# Patient Record
Sex: Male | Born: 1940 | Race: Black or African American | Hispanic: No | Marital: Married | Smoking: Former smoker
Health system: Southern US, Community
[De-identification: ages and names within clinical notes are randomized; demographics above are authoritative.]

## PROBLEM LIST (undated history)

## (undated) DIAGNOSIS — Z923 Personal history of irradiation: Secondary | ICD-10-CM

## (undated) DIAGNOSIS — K250 Acute gastric ulcer with hemorrhage: Secondary | ICD-10-CM

## (undated) DIAGNOSIS — IMO0001 Reserved for inherently not codable concepts without codable children: Secondary | ICD-10-CM

## (undated) DIAGNOSIS — Z8489 Family history of other specified conditions: Secondary | ICD-10-CM

## (undated) DIAGNOSIS — C61 Malignant neoplasm of prostate: Secondary | ICD-10-CM

## (undated) DIAGNOSIS — Z531 Procedure and treatment not carried out because of patient's decision for reasons of belief and group pressure: Secondary | ICD-10-CM

## (undated) DIAGNOSIS — I10 Essential (primary) hypertension: Secondary | ICD-10-CM

## (undated) DIAGNOSIS — E1121 Type 2 diabetes mellitus with diabetic nephropathy: Secondary | ICD-10-CM

## (undated) DIAGNOSIS — M1990 Unspecified osteoarthritis, unspecified site: Secondary | ICD-10-CM

## (undated) DIAGNOSIS — D649 Anemia, unspecified: Secondary | ICD-10-CM

## (undated) HISTORY — DX: Unspecified osteoarthritis, unspecified site: M19.90

## (undated) HISTORY — DX: Type 2 diabetes mellitus with diabetic nephropathy: E11.21

## (undated) HISTORY — DX: Malignant neoplasm of prostate: C61

## (undated) HISTORY — DX: Procedure and treatment not carried out because of patient's decision for reasons of belief and group pressure: Z53.1

## (undated) HISTORY — DX: Essential (primary) hypertension: I10

## (undated) HISTORY — DX: Personal history of irradiation: Z92.3

## (undated) HISTORY — DX: Reserved for inherently not codable concepts without codable children: IMO0001

## (undated) HISTORY — DX: Anemia, unspecified: D64.9

---

## 2004-10-19 ENCOUNTER — Emergency Department (HOSPITAL_COMMUNITY): Admission: EM | Admit: 2004-10-19 | Discharge: 2004-10-19 | Payer: Self-pay | Admitting: Emergency Medicine

## 2005-03-11 ENCOUNTER — Emergency Department (HOSPITAL_COMMUNITY): Admission: EM | Admit: 2005-03-11 | Discharge: 2005-03-12 | Payer: Self-pay | Admitting: *Deleted

## 2005-05-10 ENCOUNTER — Ambulatory Visit: Payer: Self-pay | Admitting: Gastroenterology

## 2005-06-06 ENCOUNTER — Ambulatory Visit: Payer: Self-pay | Admitting: Gastroenterology

## 2008-01-27 ENCOUNTER — Emergency Department (HOSPITAL_COMMUNITY): Admission: EM | Admit: 2008-01-27 | Discharge: 2008-01-27 | Payer: Self-pay | Admitting: Emergency Medicine

## 2010-07-14 DIAGNOSIS — C61 Malignant neoplasm of prostate: Secondary | ICD-10-CM

## 2010-07-14 HISTORY — DX: Malignant neoplasm of prostate: C61

## 2010-07-24 ENCOUNTER — Other Ambulatory Visit (HOSPITAL_COMMUNITY): Payer: Self-pay | Admitting: Urology

## 2010-07-24 DIAGNOSIS — C61 Malignant neoplasm of prostate: Secondary | ICD-10-CM

## 2010-08-01 ENCOUNTER — Encounter (HOSPITAL_COMMUNITY)
Admission: RE | Admit: 2010-08-01 | Discharge: 2010-08-01 | Disposition: A | Discharge status: Home / Self Care | Payer: Medicare Other | Source: Ambulatory Visit | Attending: Urology | Admitting: Urology

## 2010-08-01 ENCOUNTER — Encounter (HOSPITAL_COMMUNITY): Payer: Self-pay

## 2010-08-01 DIAGNOSIS — C61 Malignant neoplasm of prostate: Secondary | ICD-10-CM | POA: Insufficient documentation

## 2010-08-01 MED ORDER — TECHNETIUM TC 99M MEDRONATE IV KIT
23.7000 | PACK | Freq: Once | INTRAVENOUS | Status: AC | PRN
  Administered 2010-08-01: 23.7 via INTRAVENOUS

## 2010-08-07 ENCOUNTER — Other Ambulatory Visit (HOSPITAL_COMMUNITY): Payer: Self-pay | Admitting: Urology

## 2010-08-07 DIAGNOSIS — C61 Malignant neoplasm of prostate: Secondary | ICD-10-CM

## 2010-08-09 ENCOUNTER — Ambulatory Visit (HOSPITAL_COMMUNITY)
Admission: RE | Admit: 2010-08-09 | Discharge: 2010-08-09 | Disposition: A | Discharge status: Home / Self Care | Payer: Medicare Other | Source: Ambulatory Visit | Attending: Urology | Admitting: Urology

## 2010-08-09 DIAGNOSIS — C61 Malignant neoplasm of prostate: Secondary | ICD-10-CM

## 2010-08-09 DIAGNOSIS — M129 Arthropathy, unspecified: Secondary | ICD-10-CM | POA: Insufficient documentation

## 2010-08-09 DIAGNOSIS — R948 Abnormal results of function studies of other organs and systems: Secondary | ICD-10-CM | POA: Insufficient documentation

## 2010-08-09 DIAGNOSIS — Z8546 Personal history of malignant neoplasm of prostate: Secondary | ICD-10-CM | POA: Insufficient documentation

## 2010-09-14 ENCOUNTER — Emergency Department (HOSPITAL_COMMUNITY): Payer: Medicare Other

## 2010-09-14 ENCOUNTER — Inpatient Hospital Stay (HOSPITAL_COMMUNITY)
Admission: EM | Admit: 2010-09-14 | Discharge: 2010-09-21 | DRG: 554 | Disposition: A | Discharge status: Skilled Nursing Facility | Payer: Medicare Other | Attending: Family Medicine | Admitting: Family Medicine

## 2010-09-14 DIAGNOSIS — R5381 Other malaise: Secondary | ICD-10-CM | POA: Diagnosis present

## 2010-09-14 DIAGNOSIS — E119 Type 2 diabetes mellitus without complications: Secondary | ICD-10-CM | POA: Diagnosis present

## 2010-09-14 DIAGNOSIS — R7402 Elevation of levels of lactic acid dehydrogenase (LDH): Secondary | ICD-10-CM | POA: Diagnosis present

## 2010-09-14 DIAGNOSIS — Z8546 Personal history of malignant neoplasm of prostate: Secondary | ICD-10-CM

## 2010-09-14 DIAGNOSIS — IMO0001 Reserved for inherently not codable concepts without codable children: Secondary | ICD-10-CM | POA: Diagnosis present

## 2010-09-14 DIAGNOSIS — D509 Iron deficiency anemia, unspecified: Secondary | ICD-10-CM | POA: Diagnosis present

## 2010-09-14 DIAGNOSIS — E871 Hypo-osmolality and hyponatremia: Secondary | ICD-10-CM | POA: Diagnosis present

## 2010-09-14 DIAGNOSIS — M109 Gout, unspecified: Principal | ICD-10-CM | POA: Diagnosis present

## 2010-09-14 DIAGNOSIS — R7401 Elevation of levels of liver transaminase levels: Secondary | ICD-10-CM | POA: Diagnosis present

## 2010-09-14 DIAGNOSIS — N179 Acute kidney failure, unspecified: Secondary | ICD-10-CM | POA: Diagnosis present

## 2010-09-14 DIAGNOSIS — I10 Essential (primary) hypertension: Secondary | ICD-10-CM | POA: Diagnosis present

## 2010-09-14 LAB — COMPREHENSIVE METABOLIC PANEL
ALT: 119 U/L — ABNORMAL HIGH (ref 0–53)
Albumin: 2.6 g/dL — ABNORMAL LOW (ref 3.5–5.2)
Alkaline Phosphatase: 214 U/L — ABNORMAL HIGH (ref 39–117)
BUN: 31 mg/dL — ABNORMAL HIGH (ref 6–23)
Calcium: 9.7 mg/dL (ref 8.4–10.5)
GFR calc Af Amer: 41 mL/min — ABNORMAL LOW (ref >60)
GFR calc non Af Amer: 34 mL/min — ABNORMAL LOW (ref >60)
Glucose, Bld: 212 mg/dL — ABNORMAL HIGH (ref 70–99)
Potassium: 4.6 mEq/L (ref 3.5–5.1)
Sodium: 124 mEq/L — ABNORMAL LOW (ref 135–145)
Total Protein: 7.6 g/dL (ref 6.0–8.3)

## 2010-09-14 LAB — SYNOVIAL CELL COUNT + DIFF, W/ CRYSTALS
Eosinophils-Synovial: 0 % (ref 0–1)
Lymphocytes-Synovial Fld: 3 % (ref 0–20)
Monocyte-Macrophage-Synovial Fluid: 10 % — ABNORMAL LOW (ref 50–90)
WBC, Synovial: UNDETERMINED /mm3 (ref 0–200)

## 2010-09-14 LAB — DIFFERENTIAL
Basophils Absolute: 0 10*3/uL (ref 0.0–0.1)
Basophils Relative: 0 % (ref 0–1)
Eosinophils Absolute: 0 10*3/uL (ref 0.0–0.7)
Eosinophils Relative: 0 % (ref 0–5)
Lymphs Abs: 1 10*3/uL (ref 0.7–4.0)
Neutro Abs: 14.1 10*3/uL — ABNORMAL HIGH (ref 1.7–7.7)
Neutrophils Relative %: 83 % — ABNORMAL HIGH (ref 43–77)

## 2010-09-14 LAB — CK: Total CK: 26 U/L (ref 7–232)

## 2010-09-14 LAB — GLUCOSE, CAPILLARY: Glucose-Capillary: 250 mg/dL — ABNORMAL HIGH (ref 70–99)

## 2010-09-14 LAB — CBC
HCT: 27.2 % — ABNORMAL LOW (ref 39.0–52.0)
MCH: 24.1 pg — ABNORMAL LOW (ref 26.0–34.0)
MCHC: 33.5 g/dL (ref 30.0–36.0)
MCV: 72.1 fL — ABNORMAL LOW (ref 78.0–100.0)
Platelets: 422 10*3/uL — ABNORMAL HIGH (ref 150–400)
RBC: 3.77 MIL/uL — ABNORMAL LOW (ref 4.22–5.81)
RDW: 19 % — ABNORMAL HIGH (ref 11.5–15.5)
WBC: 17 10*3/uL — ABNORMAL HIGH (ref 4.0–10.5)

## 2010-09-14 LAB — URINALYSIS, ROUTINE W REFLEX MICROSCOPIC
Bilirubin Urine: NEGATIVE
Glucose, UA: NEGATIVE mg/dL
Hgb urine dipstick: NEGATIVE
Ketones, ur: NEGATIVE mg/dL
Leukocytes, UA: NEGATIVE
Nitrite: NEGATIVE
Specific Gravity, Urine: 1.017 (ref 1.005–1.030)
pH: 5.5 (ref 5.0–8.0)

## 2010-09-14 LAB — URIC ACID: Uric Acid, Serum: 10.3 mg/dL — ABNORMAL HIGH (ref 4.0–7.8)

## 2010-09-14 LAB — URINE MICROSCOPIC-ADD ON

## 2010-09-14 LAB — PROCALCITONIN: Procalcitonin: 3.53 ng/mL

## 2010-09-14 LAB — LACTIC ACID, PLASMA: Lactic Acid, Venous: 2.3 mmol/L — ABNORMAL HIGH (ref 0.5–2.2)

## 2010-09-15 ENCOUNTER — Inpatient Hospital Stay (HOSPITAL_COMMUNITY): Payer: Medicare Other

## 2010-09-15 LAB — COMPREHENSIVE METABOLIC PANEL
Albumin: 2.1 g/dL — ABNORMAL LOW (ref 3.5–5.2)
Alkaline Phosphatase: 196 U/L — ABNORMAL HIGH (ref 39–117)
BUN: 32 mg/dL — ABNORMAL HIGH (ref 6–23)
CO2: 24 mEq/L (ref 19–32)
Chloride: 94 mEq/L — ABNORMAL LOW (ref 96–112)
Glucose, Bld: 176 mg/dL — ABNORMAL HIGH (ref 70–99)
Potassium: 4.2 mEq/L (ref 3.5–5.1)
Total Bilirubin: 0.7 mg/dL (ref 0.3–1.2)

## 2010-09-15 LAB — HEMOGLOBIN A1C
Hgb A1c MFr Bld: 8.5 % — ABNORMAL HIGH (ref <5.7)
Mean Plasma Glucose: 197 mg/dL — ABNORMAL HIGH (ref <117)

## 2010-09-15 LAB — HIV ANTIBODY (ROUTINE TESTING W REFLEX): HIV: NONREACTIVE

## 2010-09-15 LAB — CBC
HCT: 21.8 % — ABNORMAL LOW (ref 39.0–52.0)
Hemoglobin: 7.4 g/dL — ABNORMAL LOW (ref 13.0–17.0)
MCH: 24.5 pg — ABNORMAL LOW (ref 26.0–34.0)
MCHC: 33.9 g/dL (ref 30.0–36.0)
MCV: 72.2 fL — ABNORMAL LOW (ref 78.0–100.0)
Platelets: 336 10*3/uL (ref 150–400)
RDW: 19 % — ABNORMAL HIGH (ref 11.5–15.5)
WBC: 11 10*3/uL — ABNORMAL HIGH (ref 4.0–10.5)

## 2010-09-15 LAB — GLUCOSE, CAPILLARY: Glucose-Capillary: 157 mg/dL — ABNORMAL HIGH (ref 70–99)

## 2010-09-15 LAB — PROTIME-INR: Prothrombin Time: 16.6 seconds — ABNORMAL HIGH (ref 11.6–15.2)

## 2010-09-15 LAB — OSMOLALITY: Osmolality: 279 mOsm/kg (ref 275–300)

## 2010-09-16 ENCOUNTER — Inpatient Hospital Stay (HOSPITAL_COMMUNITY): Payer: Medicare Other

## 2010-09-16 LAB — COMPREHENSIVE METABOLIC PANEL
AST: 61 U/L — ABNORMAL HIGH (ref 0–37)
Albumin: 1.9 g/dL — ABNORMAL LOW (ref 3.5–5.2)
Alkaline Phosphatase: 180 U/L — ABNORMAL HIGH (ref 39–117)
Chloride: 96 mEq/L (ref 96–112)
Creatinine, Ser: 1.56 mg/dL — ABNORMAL HIGH (ref 0.4–1.5)
GFR calc Af Amer: 53 mL/min — ABNORMAL LOW (ref >60)
Potassium: 4.1 mEq/L (ref 3.5–5.1)
Total Bilirubin: 0.5 mg/dL (ref 0.3–1.2)
Total Protein: 5.9 g/dL — ABNORMAL LOW (ref 6.0–8.3)

## 2010-09-16 LAB — URINE CULTURE
Colony Count: 35000
Culture  Setup Time: 201205242135

## 2010-09-16 LAB — CBC
Platelets: 398 10*3/uL (ref 150–400)
RBC: 2.93 MIL/uL — ABNORMAL LOW (ref 4.22–5.81)
WBC: 12.3 10*3/uL — ABNORMAL HIGH (ref 4.0–10.5)

## 2010-09-16 LAB — GLUCOSE, CAPILLARY
Glucose-Capillary: 146 mg/dL — ABNORMAL HIGH (ref 70–99)
Glucose-Capillary: 140 mg/dL — ABNORMAL HIGH (ref 70–99)

## 2010-09-16 LAB — FERRITIN: Ferritin: 2747 ng/mL — ABNORMAL HIGH (ref 22–322)

## 2010-09-16 LAB — VITAMIN B12: Vitamin B-12: 360 pg/mL (ref 211–911)

## 2010-09-17 LAB — COMPREHENSIVE METABOLIC PANEL
BUN: 26 mg/dL — ABNORMAL HIGH (ref 6–23)
CO2: 25 mEq/L (ref 19–32)
Calcium: 9.1 mg/dL (ref 8.4–10.5)
Creatinine, Ser: 1.18 mg/dL (ref 0.4–1.5)
GFR calc non Af Amer: >60 mL/min (ref >60)
Glucose, Bld: 123 mg/dL — ABNORMAL HIGH (ref 70–99)
Sodium: 130 mEq/L — ABNORMAL LOW (ref 135–145)
Total Protein: 6 g/dL (ref 6.0–8.3)

## 2010-09-17 LAB — CBC
HCT: 21.1 % — ABNORMAL LOW (ref 39.0–52.0)
Hemoglobin: 7 g/dL — ABNORMAL LOW (ref 13.0–17.0)
MCV: 73.3 fL — ABNORMAL LOW (ref 78.0–100.0)
RBC: 2.88 MIL/uL — ABNORMAL LOW (ref 4.22–5.81)
WBC: 9.2 10*3/uL (ref 4.0–10.5)

## 2010-09-17 LAB — GLUCOSE, CAPILLARY: Glucose-Capillary: 145 mg/dL — ABNORMAL HIGH (ref 70–99)

## 2010-09-17 LAB — HEPATITIS PANEL, ACUTE
HCV Ab: NEGATIVE
Hep B C IgM: NEGATIVE
Hepatitis B Surface Ag: NEGATIVE

## 2010-09-18 ENCOUNTER — Inpatient Hospital Stay (HOSPITAL_COMMUNITY): Payer: Medicare Other

## 2010-09-18 LAB — GLUCOSE, CAPILLARY
Glucose-Capillary: 128 mg/dL — ABNORMAL HIGH (ref 70–99)
Glucose-Capillary: 131 mg/dL — ABNORMAL HIGH (ref 70–99)
Glucose-Capillary: 100 mg/dL — ABNORMAL HIGH (ref 70–99)

## 2010-09-18 LAB — CBC
HCT: 23.6 % — ABNORMAL LOW (ref 39.0–52.0)
MCHC: 32.2 g/dL (ref 30.0–36.0)
Platelets: 503 10*3/uL — ABNORMAL HIGH (ref 150–400)
RDW: 19 % — ABNORMAL HIGH (ref 11.5–15.5)
WBC: 8.5 10*3/uL (ref 4.0–10.5)

## 2010-09-18 LAB — COMPREHENSIVE METABOLIC PANEL
ALT: 53 U/L (ref 0–53)
Albumin: 2.1 g/dL — ABNORMAL LOW (ref 3.5–5.2)
Alkaline Phosphatase: 176 U/L — ABNORMAL HIGH (ref 39–117)
BUN: 20 mg/dL (ref 6–23)
Calcium: 9.2 mg/dL (ref 8.4–10.5)
Glucose, Bld: 103 mg/dL — ABNORMAL HIGH (ref 70–99)
Potassium: 4.1 mEq/L (ref 3.5–5.1)
Sodium: 135 mEq/L (ref 135–145)
Total Protein: 6 g/dL (ref 6.0–8.3)

## 2010-09-18 LAB — BODY FLUID CULTURE

## 2010-09-18 MED ORDER — GADOBENATE DIMEGLUMINE 529 MG/ML IV SOLN
20.0000 mL | Freq: Once | INTRAVENOUS | Status: AC | PRN
  Administered 2010-09-18: 20 mL via INTRAVENOUS

## 2010-09-19 ENCOUNTER — Encounter (HOSPITAL_COMMUNITY): Payer: Self-pay | Admitting: Radiology

## 2010-09-19 ENCOUNTER — Inpatient Hospital Stay (HOSPITAL_COMMUNITY): Payer: Medicare Other

## 2010-09-19 LAB — BASIC METABOLIC PANEL
BUN: 15 mg/dL (ref 6–23)
CO2: 26 mEq/L (ref 19–32)
Calcium: 9.5 mg/dL (ref 8.4–10.5)
Chloride: 101 mEq/L (ref 96–112)
Creatinine, Ser: 1.02 mg/dL (ref 0.4–1.5)
Glucose, Bld: 102 mg/dL — ABNORMAL HIGH (ref 70–99)

## 2010-09-19 LAB — CBC
Hemoglobin: 7.7 g/dL — ABNORMAL LOW (ref 13.0–17.0)
MCH: 23.7 pg — ABNORMAL LOW (ref 26.0–34.0)
MCHC: 32 g/dL (ref 30.0–36.0)
MCV: 74.2 fL — ABNORMAL LOW (ref 78.0–100.0)
RBC: 3.25 MIL/uL — ABNORMAL LOW (ref 4.22–5.81)

## 2010-09-19 LAB — GLUCOSE, CAPILLARY: Glucose-Capillary: 166 mg/dL — ABNORMAL HIGH (ref 70–99)

## 2010-09-19 MED ORDER — TECHNETIUM TC 99M MEBROFENIN IV KIT
5.5000 | PACK | Freq: Once | INTRAVENOUS | Status: AC | PRN
  Administered 2010-09-19: 5.5 via INTRAVENOUS

## 2010-09-19 MED ORDER — SINCALIDE 5 MCG IJ SOLR: 0.0200 ug/kg | Freq: Once | INTRAMUSCULAR | Status: DC

## 2010-09-20 LAB — PROTEIN ELECTROPH W RFLX QUANT IMMUNOGLOBULINS
Alpha-1-Globulin: 13.8 % — ABNORMAL HIGH (ref 2.9–4.9)
Beta 2: 7.5 % — ABNORMAL HIGH (ref 3.2–6.5)
Gamma Globulin: 14 % (ref 11.1–18.8)
M-Spike, %: NOT DETECTED g/dL

## 2010-09-20 LAB — BASIC METABOLIC PANEL
BUN: 12 mg/dL (ref 6–23)
Calcium: 9.3 mg/dL (ref 8.4–10.5)
Creatinine, Ser: 0.92 mg/dL (ref 0.4–1.5)
GFR calc Af Amer: >60 mL/min (ref >60)
GFR calc non Af Amer: >60 mL/min (ref >60)

## 2010-09-20 LAB — CBC
MCH: 24.2 pg — ABNORMAL LOW (ref 26.0–34.0)
MCHC: 32.4 g/dL (ref 30.0–36.0)
MCV: 74.8 fL — ABNORMAL LOW (ref 78.0–100.0)
Platelets: 549 10*3/uL — ABNORMAL HIGH (ref 150–400)
RBC: 3.22 MIL/uL — ABNORMAL LOW (ref 4.22–5.81)
RDW: 19 % — ABNORMAL HIGH (ref 11.5–15.5)

## 2010-09-20 LAB — GLUCOSE, CAPILLARY
Glucose-Capillary: 120 mg/dL — ABNORMAL HIGH (ref 70–99)
Glucose-Capillary: 158 mg/dL — ABNORMAL HIGH (ref 70–99)
Glucose-Capillary: 98 mg/dL (ref 70–99)

## 2010-09-20 LAB — DIFFERENTIAL
Basophils Relative: 0 % (ref 0–1)
Eosinophils Absolute: 0.1 10*3/uL (ref 0.0–0.7)
Eosinophils Relative: 1 % (ref 0–5)
Lymphs Abs: 2.3 10*3/uL (ref 0.7–4.0)
Monocytes Relative: 7 % (ref 3–12)
Neutrophils Relative %: 68 % (ref 43–77)

## 2010-09-20 LAB — CULTURE, BLOOD (ROUTINE X 2): Culture: NO GROWTH

## 2010-09-20 LAB — IMMUNOFIXATION ADD-ON

## 2010-09-21 LAB — CULTURE, BLOOD (ROUTINE X 2)
Culture: NO GROWTH
Culture: NO GROWTH

## 2010-09-21 NOTE — H&P (Signed)
NAME:  PRENTIS, LANGDON NO.:  [id number]  MEDICAL RECORD NO.:  [id number]           PATIENT TYPE:  E  LOCATION:  WLED                         FACILITY:  [facility]  PHYSICIAN:  Mauro Kaufmann, MD         DATE OF BIRTH:  07-08-1940  DATE OF ADMISSION:  09/14/2010 DATE OF DISCHARGE:                             HISTORY & PHYSICAL   PRIMARY CARE PHYSICIAN:  Georgianne Fick, MD.  CHIEF COMPLAINT:  Generalized weakness.  HISTORY OF PRESENT ILLNESS:  A 70 year old male who was brought to the hospital because of fever and generalized weakness which has been going on for about 4 weeks.  The patient says the pain is in the neck, shoulders, back, and both the knees.  The patient also has been having off and on fever.  The patient has a history of gout, hypertension, and diabetes mellitus.  He has been not been taking the Uloric that was prescribed.  Also it is noted that the patient was started on lovastatin 3 weeks ago by his primary care physician.  Denies any chest pain. Denies any shortness of breath.  Denies any nausea, vomiting, or diarrhea.  The patient has been so weak that he has been unable to walk due to both the knee pain as well as generalized muscle weakness.  No dysuria, urgency, frequency of urination.  PAST MEDICAL HISTORY: 1. Significant for hypertension. 2. Gout. 3. Diabetes mellitus. 4. Prostate cancer.  SURGICAL HISTORY:  None.  SOCIAL HISTORY:  The patient is nonsmoker, nonalcoholic.  No history of illicit drug abuse.  MEDICATIONS: 1. Lovastatin 40 mg p.o. daily. 2. Metformin XR 500 mg 2 tablets daily at bedtime. 3. Metoprolol XL 25 mg 1 p.o. daily. 4. Norvasc 5 mg p.o. daily. 5. Hydrochlorothiazide 25 mg 1 tablet p.o. daily. 6. Lisinopril 20 mg p.o. twice daily. 7. Lantus 20 units daily at bedtime. 8. Allopurinol 100 mg 0.5 tablet p.o. daily.  REVIEW OF SYSTEMS:  HEENT:  There is no headache, no blurred vision. NECK:  No history of thyroid  problems.  CHEST:  No history of COPD. HEART:  No history of CAD.  GI:  As in HPI.  GENITOURINARY:  No dysuria, urgency, frequency of urination.  NEUROLOGICAL:  No history of stroke or TIA in the past.  No history of seizures.  PHYSICAL EXAMINATION:  VITAL SIGNS:  Temperature is 102.3, blood pressure 142/81, pulse 106, respirations 16. HEENT:  Head is atraumatic, normocephalic.  Eyes:  Extraocular movements are intact.  Oral mucosa is pink and moist. NECK:  Supple. CHEST:  Clear to auscultation bilaterally. HEART:  S1, S2.  Regular in rate and rhythm. ABDOMEN:  Soft, nontender.  No organomegaly. EXTREMITIES:  No cyanosis, no clubbing, no edema.  Joints:  Both the knee joints are tender to touch and warm but no erythema noted on the knee joints. NEUROLOGICAL:  The patient is able to wiggle his toes but because of the extreme pain in the knees and muscle weakness, is unable to lift his leg up.  Otherwise there is no focal deficit noted at this time.  IMAGING  STUDIES DONE TODAY: 1. X-ray of the knee showed large knee joint effusion with probable     medial soft tissue swelling. 2. Chest x-ray showed no active disease.  Left knee x-ray also showed     large knee joint effusion similar to the patient's opposite side.  PERTINENT LABS:  WBC 17.0, hemoglobin 9.1, hematocrit 27.2, platelet count of 422,000.  Sodium 124, potassium 4.6, chloride 38, CO2 of 19, BUN 31, creatinine 1.95, alk phos 214, AST is 137, ALT 119.  Uric acid 10.3, lactic acid is 2.3.  Urinalysis negative.  ASSESSMENT: 1. Generalized weakness. 2. Bilateral knee joint effusion, gout versus infection. 3. Gout. 4. Diabetes mellitus. 5. Hypertension. 6. Hyponatremia. 7. Transaminitis.  PLAN: 1. Generalized weakness.  The patient was recently started on     lovastatin 3 weeks ago by his primary care provider for     hyperlipidemia and now has elevated LFTs along with generalized     muscle weakness.  I am going to  obtain total CPK on this patient to     rule out underlying rhabdomyolysis.  The patient's lovastatin will     be held at this time and we will follow the patient's LFTs in the     morning. 2. Knee joint effusion.  The patient has bilateral knee jerk joint     effusion and also he has elevated uric acid which points towards     more like gout than infection, but he does have fever and high     white count.  So ER physician has called up orthopedic surgeon Dr.     Shon Baton who has asked the ER physician to tap the knee and he is     going to send the fluid for analysis.  In the meantime, I am going     to start the patient on Zosyn empirically to cover for possible     septic arthritis. 3. Gout.  The patient does have a history of gout.  Has elevated uric     acid and the knee effusion looks more like gout, so I am going to     start the patient on colchicine.  He will be given 1.2 mg dose     right now and then in the morning we will start him on 0.6 p.o.     b.i.d.  He has been on allopurinol which will be continued. 4. Transaminitis.  Again this could be due to the statins but I am     also going to obtain abdominal ultrasound though the patient does     not have any abdominal pain, no nausea, vomiting.  We will still     get abdominal ultrasound to make sure that liver and gallbladder     are fine. 5. Hyponatremia.  The patient has been on hydrochlorothiazide 25 mg     p.o. daily which could be the culprit causing the hyponatremia.  I     am going to hold the hydrochlorothiazide at this time and also     going to obtain urine and serum osmolality.  We will follow the     patient's serum sodium in the morning. 6. Diabetes mellitus.  The patient will be put on sliding scale     insulin. 7. Leukocytosis.  This could be secondary to gout versus infection.     The patient is empirically put on Zosyn and will follow the     patient's WBC in the morning. 8. DVT  prophylaxis with  Lovenox.     Mauro Kaufmann, MD     GL/MEDQ  D:  09/14/2010  T:  09/14/2010  Job:  [id number]  Electronically Signed by Mauro Kaufmann  on 09/21/2010 07:04:11 AM

## 2010-09-25 NOTE — Discharge Summary (Signed)
NAME:  Roger Wood, Roger Wood NO.:  [id number]  MEDICAL RECORD NO.:  [id number]           PATIENT TYPE:  I  LOCATION:  1436                         FACILITY:  [facility]  PHYSICIAN:  Brendia Sacks, MD    DATE OF BIRTH:  [birth year]  DATE OF ADMISSION:  09/14/2010 DATE OF DISCHARGE:  09/21/2010                        DISCHARGE SUMMARY - REFERRING   ADDENDUM:  PRIMARY CARE PHYSICIAN:  Georgianne Fick, M.D.  This is an addendum discharge to one dictated by Dr. Osvaldo Shipper on May 29.  Please see that discharge summary for full details of the patient's hospitalization.  The patient's hospital course has remained unchanged since that dictation, except as addenda below.  In general, the patient continues to feel better.  He has increased range of motion of his knees and is improving with physical therapy. His wrist pain is totally resolved.  His nuclear medicine hepatobiliary scan was read as normal biliary patency study and normal gallbladder ejection fraction.  Serum protein electrophoresis was abnormal for alpha 1, alpha 2, beta serum and beta 2.  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  Significance of these findings are unclear. Recommend outpatient referral to hematology for further evaluation.  PHYSICAL EXAMINATION:  GENERAL:  Examination for day of discharge, May 31.  The patient feels well.  His wrist pain is totally resolved.  He is ambulating better.  His knee pain continues to decrease and he has increased range of motion in his knees. VITAL SIGNS:  He remains afebrile, temperature is 98.0, pulse 76, respirations 18, blood pressure 179/73, sat 99% on room air. CARDIOVASCULAR:  Regular rate and rhythm.  No murmur, rub or gallop. RESPIRATORY:  Clear to auscultation bilaterally.  No wheezes, rales or rhonchi.  Normal respiratory effort. MUSCULOSKELETAL:  No significant lower extremity edema.  Right wrist has good range of  movement and is nontender.  Excellent grip strength. Bilateral knees had decreased effusions.  They are nontender to palpation.  He has increased flexion to 90 degrees and increased extension to about 170 degrees bilaterally.  There is no erythema or pain with palpation.  The patient's disposition remains to skilled facility today.  UPDATED DISCHARGE MEDICATIONS:  These supersede Dr. Chancy Milroy dictation: 1. Colchicine 0.6 mg p.o. b.i.d.  Decrease to once daily in 5 to 7     days.  Consider discontinuing this totally in the next 1 to 2     weeks. 2. Benadryl 25 to 50 mg every 6 hours as needed for itching. 3. Colace 100 mg p.o. b.i.d. 4. Hydralazine 50 mg p.o. t.i.d. 5. Hydrocodone/acetaminophen 5/325 one to two tablets every 4 hours as     needed for joint pains, #30, no refills. 6. Nutritional supplement, Glucerna 1 can t.i.d. 7. Allopurinol 100 mg p.o. daily. 8. Lantus has been decreased to10 units subcu q.h.s.  Capillary blood     sugars remained well controlled. 9. Lisinopril 20 mg p.o. daily. 10.Toprol XL, has been increased to 75 mg p.o. daily. 11.Norvasc has been increased to 10 mg p.o. daily. 12.Lovastatin 40 mg p.o. q.h.s.  DISCONTINUED MEDICATIONS:  Discontinue the  following medications: 1. Hydrochlorothiazide. 2. Metformin.  Time coordinating discharge 30 minutes.     Brendia Sacks, MD     DG/MEDQ  D:  09/21/2010  T:  09/21/2010  Job:  [id number]  cc:   Georgianne Fick, M.D. Fax: [phone]  Electronically Signed by Brendia Sacks  on 09/25/2010 05:51:56 PM

## 2010-09-27 LAB — IGG, IGA, IGM
IgA: 185 mg/dL (ref 68–379)
IgG (Immunoglobin G), Serum: 830 mg/dL (ref 650–1600)
IgM, Serum: 117 mg/dL (ref 41–251)

## 2010-09-28 NOTE — Discharge Summary (Signed)
NAME:  Roger Wood, Roger Wood NO.:  [id number]  MEDICAL RECORD NO.:  [id number]           PATIENT TYPE:  I  LOCATION:  1436                         [facility]ACILITY:  [facility]  PHYSICIAN:  Osvaldo Shipper, MD     DATE OF BIRTH:  25-Feb-1941  DATE OF ADMISSION:  09/14/2010 DATE OF DISCHARGE:                        DISCHARGE SUMMARY - REFERRING   POSSIBLE DATE OF DISCHARGE:  Sep 20, 2010.  PRIMARY CARE PHYSICIAN:  Georgianne Fick, M.D.  CONSULTATIONS:  No consultations obtained during this admission.  IMAGING STUDIES:  Imaging studies done during this admission include the following: 1. X-ray of the right knee which showed large joint effusion without     any other acute osseous findings. 2. X-ray of the left knee once again showed large joint effusion in     the right. 3. Chest x-ray showed no active disease. 4. Ultrasound of the abdomen showed a possible lesion in the left     kidney and it showed a contracted gallbladder with a trilayered     appearance suggesting possibility of wall edema, can be seen with     chronic cholecystitis.  No acute cholecystitis was seen. 5. CT of the abdomen and pelvis showed no renal mass, without any     contrast that could be given at that time.  Moderately enlarged     prostate gland, mild bibasilar atelectasis and a small right     inguinal hernia containing fluid. 6. MRI of the abdomen done this morning showed mild left renal     parenchymal scarring without any neoplasm.  LABORATORY DATA:  Blood work showed anemia with hemoglobin between 7 and 8, MCV of 74.  Coags were normal.  He also had hyponatremia at 124.  He had prerenal azotemia with a BUN of 31, creatinine of 1.95.  He had transaminitis with AST of 137, ALT of 119, alk phos 214, uric acid was 10.3.  HbA1c 8.5, total CK 26.  Iron studies showed a ferritin of 2747, percent saturation was 10.  Iron was 12, B12 of 360, folate 6.1. Hepatitis panel was negative.  HIV was  nonreactive.  Synovial fluid cultures were negative.  Blood cultures were negative.  Urine cultures negative.  Fecal occult blood testing negative.  DISCHARGE DIAGNOSES: 1. Acute gout involving multiple joints, improved. 2. History of prostate cancer, being followed by Dr. Brunilda Payor as an     outpatient. 3. Reverse albumin to globulin ratio, SPEP is pending. 4. Contracted gallbladder on ultrasound.  HIDA scan is pending,     although the patient is asymptomatic. 5. Transaminitis, etiology unclear, see above. 6. Microcytic anemia, stable. 7. Diabetes type 2, on Lantus. 8. Hypertension, poorly controlled, requiring medication adjustments. 9. Hyponatremia, resolved. 10.Fever and myalgias resolved.  BRIEF HOSPITAL COURSE: 1. Joint pains.  This is a 70 year old African-American male who     presented with complaints of weakness, malaise, fever and pain in     many of his joints.  Especially, this was in his right wrist, both     the knees.  He has history of gout, so this was thought to be  acute     gouty arthropathy.  Uric acid level was elevated.  The patient was     started on colchicine and continued on his allopurinol.  Because of     his diabetes, we were hesitant to give him any steroids and because     of his renal insufficiency we could not give him any NSAIDs.  So     what we did was to continue the colchicine.  The patient showed     gradual improvement and is able to move his wrists and his joints     and his knee joints as well.  He had been seen by PT and OT and     they recommended skilled nursing facility placement at this time.     Allopurinol dose will be increased 2. Fever, myalgia.  There was a concern about septic arthritis, so     synovial fluid was aspirated.  Cultures have been negative.  It is     felt that he may have had a superimposed viral infection.  He has     not had any fever since he has been in the hospital.  Blood     cultures are all negative.  He was on  Zosyn initially which has     been discontinued. 3. Transaminitis was appreciated.  Ultrasound of the abdomen was done,     results as discussed above.  Hepatitis panel is negative.  Even     though his abdomen is nontender, we have gone ahead and ordered a     HIDA scan which I am hoping will be done later today.  If that is     negative or even if that is abnormal, he will just require followup     with his PCP and they can determine if the patient needs to be     referred to a surgeon or not.  Once again the patient does not have     any symptoms of nausea or vomiting at this time nor does he have     any abdominal tenderness. 4. Reverse AG ratio.  It was appreciated the albumin-globulin ratio     was reversed.  SPEP has been ordered and has been done with results     are pending.  He did have nuclear medicine bone scan in April which     showed abnormality in thoracolumbar spine, however, he subsequently     had a CT of the thoracic and lumbar spine which did not show any     metastatic process.  However, SPEP is pending. 5. Microcytic anemia.  This has been stable.  He has undergone     colonoscopy and even has had an EGD within the last 3 years with no     abnormalities per the patient.  He can follow up with his PCP. 6. Thrombocytosis, etiology uncertain, could be an acute-phase     reactant. 7. History of prostate cancer per Dr. Brunilda Payor as outpatient. 8. Hypertension, poorly controlled.  We will increase the dose of his     medications and add hydralazine. 9. Diabetes.  I will decrease the dose of Lantus because the patient's     p.o. intake was poor.  Until he has consistent p.o. intake, Lantus     will be at a lower dose. 10.His electrolyte abnormalities have all been corrected.  Basically, at this point, we are waiting on the results of the SPEP and HIDA scan.  He  will need to go to skilled nursing facility.  I had a long discussion with him, his wife and sister today, and I  have told them that he will definitely benefit from short-term rehab.  Social worker will be informed.  DISCHARGE MEDICATIONS:  At the time of this dictation, discharge medications are as follows: 1. Colchicine 0.6 mg every 6 hours for 2 weeks and then as needed for     joint pain. 2. Benadryl 25 to 50 mg every 6 hours as needed for itching. 3. Colace 100 mg p.o. b.i.d. 4. Hydralazine 50 mg t.i.d. 5. Hydrocodone/acetaminophen 5/325 mg 1-2 tablets every 4 hours as     needed for joint pain. 6. Glucerna 1 can t.i.d. with meals. 7. Allopurinol 100 mg daily. 8. Lantus insulin 10 units subcu q.h.s. 9. Lisinopril 20 mg daily. 10.Metoprolol XL 75 mg daily. 11.Norvasc 10 mg daily. 12.Lovastatin 40 mg at bedtime. 13.We are actually discontinuing his metformin as well as his     hydrochlorothiazide.  FOLLOWUP:  Follow up with Dr. Nicholos Johns in 2 weeks and with Dr. Brunilda Payor as before.  DIET:  Modified carbohydrate.  ACTIVITY:  Physical activity per PT, OT and rehab.  TOTAL TIME ON THIS DISCHARGE ENCOUNTER:  35 minutes.  Please note pending studies include SPEP and HIDA scan.     Osvaldo Shipper, MD     GK/MEDQ  D:  09/19/2010  T:  09/19/2010  Job:  [id number]  Electronically Signed by Osvaldo Shipper MD on 09/28/2010 11:19:09 PM

## 2010-10-05 ENCOUNTER — Other Ambulatory Visit: Payer: Self-pay | Admitting: Hematology and Oncology

## 2010-10-05 ENCOUNTER — Encounter (HOSPITAL_BASED_OUTPATIENT_CLINIC_OR_DEPARTMENT_OTHER): Payer: Medicare Other | Admitting: Hematology and Oncology

## 2010-10-05 DIAGNOSIS — D472 Monoclonal gammopathy: Secondary | ICD-10-CM

## 2010-10-05 DIAGNOSIS — D539 Nutritional anemia, unspecified: Secondary | ICD-10-CM

## 2010-10-05 LAB — CBC WITH DIFFERENTIAL/PLATELET
EOS%: 1.2 % (ref 0.0–7.0)
Eosinophils Absolute: 0.1 10*3/uL (ref 0.0–0.5)
HGB: 8.4 g/dL — ABNORMAL LOW (ref 13.0–17.1)
MCH: 25.3 pg — ABNORMAL LOW (ref 27.2–33.4)
MCV: 76.7 fL — ABNORMAL LOW (ref 79.3–98.0)
MONO%: 9.3 % (ref 0.0–14.0)
NEUT#: 5.1 10*3/uL (ref 1.5–6.5)
RBC: 3.31 10*6/uL — ABNORMAL LOW (ref 4.20–5.82)
RDW: 21.4 % — ABNORMAL HIGH (ref 11.0–14.6)
lymph#: 1.8 10*3/uL (ref 0.9–3.3)

## 2010-10-05 LAB — MORPHOLOGY: PLT EST: ADEQUATE

## 2010-10-05 LAB — URINALYSIS, MICROSCOPIC - CHCC
Bilirubin (Urine): NEGATIVE
Glucose: NEGATIVE g/dL
Ketones: NEGATIVE mg/dL
Leukocyte Esterase: NEGATIVE

## 2010-10-09 LAB — COMPREHENSIVE METABOLIC PANEL
AST: 12 U/L (ref 0–37)
Albumin: 3.5 g/dL (ref 3.5–5.2)
Alkaline Phosphatase: 112 U/L (ref 39–117)
Potassium: 3.8 mEq/L (ref 3.5–5.3)
Sodium: 138 mEq/L (ref 135–145)
Total Protein: 6.1 g/dL (ref 6.0–8.3)

## 2010-10-09 LAB — SPEP & IFE WITH QIG
Albumin ELP: 48.8 % — ABNORMAL LOW (ref 55.8–66.1)
Alpha-1-Globulin: 8 % — ABNORMAL HIGH (ref 2.9–4.9)
Alpha-2-Globulin: 13.9 % — ABNORMAL HIGH (ref 7.1–11.8)
Beta 2: 6.4 % (ref 3.2–6.5)
IgM, Serum: 141 mg/dL (ref 41–251)

## 2010-10-09 LAB — DIRECT ANTIGLOBULIN TEST (NOT AT ARMC): DAT IgG: NEGATIVE

## 2010-10-09 LAB — IRON AND TIBC: UIBC: 182 ug/dL

## 2010-10-09 LAB — HAPTOGLOBIN: Haptoglobin: 290 mg/dL — ABNORMAL HIGH (ref 30–200)

## 2010-10-09 LAB — FERRITIN: Ferritin: 777 ng/mL — ABNORMAL HIGH (ref 22–322)

## 2010-10-09 LAB — VITAMIN B12: Vitamin B-12: 365 pg/mL (ref 211–911)

## 2010-10-11 ENCOUNTER — Emergency Department (HOSPITAL_COMMUNITY)
Admission: EM | Admit: 2010-10-11 | Discharge: 2010-10-11 | Disposition: A | Discharge status: Home / Self Care | Payer: Medicare Other | Attending: Emergency Medicine | Admitting: Emergency Medicine

## 2010-10-11 ENCOUNTER — Emergency Department (HOSPITAL_COMMUNITY): Payer: Medicare Other

## 2010-10-11 ENCOUNTER — Encounter (HOSPITAL_BASED_OUTPATIENT_CLINIC_OR_DEPARTMENT_OTHER): Payer: Medicare Other | Admitting: Hematology and Oncology

## 2010-10-11 DIAGNOSIS — E119 Type 2 diabetes mellitus without complications: Secondary | ICD-10-CM | POA: Insufficient documentation

## 2010-10-11 DIAGNOSIS — M109 Gout, unspecified: Secondary | ICD-10-CM | POA: Insufficient documentation

## 2010-10-11 DIAGNOSIS — C61 Malignant neoplasm of prostate: Secondary | ICD-10-CM | POA: Insufficient documentation

## 2010-10-11 DIAGNOSIS — D472 Monoclonal gammopathy: Secondary | ICD-10-CM

## 2010-10-11 DIAGNOSIS — M069 Rheumatoid arthritis, unspecified: Secondary | ICD-10-CM | POA: Insufficient documentation

## 2010-10-11 DIAGNOSIS — I10 Essential (primary) hypertension: Secondary | ICD-10-CM | POA: Insufficient documentation

## 2010-10-11 DIAGNOSIS — D539 Nutritional anemia, unspecified: Secondary | ICD-10-CM

## 2010-10-11 DIAGNOSIS — D6489 Other specified anemias: Secondary | ICD-10-CM | POA: Insufficient documentation

## 2010-10-11 DIAGNOSIS — R509 Fever, unspecified: Secondary | ICD-10-CM | POA: Insufficient documentation

## 2010-10-11 LAB — DIFFERENTIAL
Lymphocytes Relative: 11 % — ABNORMAL LOW (ref 12–46)
Lymphs Abs: 1.3 10*3/uL (ref 0.7–4.0)
Monocytes Relative: 12 % (ref 3–12)
Neutrophils Relative %: 76 % (ref 43–77)

## 2010-10-11 LAB — COMPREHENSIVE METABOLIC PANEL
ALT: 17 U/L (ref 0–53)
AST: 15 U/L (ref 0–37)
Albumin: 2.8 g/dL — ABNORMAL LOW (ref 3.5–5.2)
Alkaline Phosphatase: 101 U/L (ref 39–117)
CO2: 24 mEq/L (ref 19–32)
Chloride: 94 mEq/L — ABNORMAL LOW (ref 96–112)
Creatinine, Ser: 1.29 mg/dL (ref 0.50–1.35)
GFR calc non Af Amer: 55 mL/min — ABNORMAL LOW (ref >60)
Potassium: 3.6 mEq/L (ref 3.5–5.1)
Total Bilirubin: 0.6 mg/dL (ref 0.3–1.2)

## 2010-10-11 LAB — URINE MICROSCOPIC-ADD ON

## 2010-10-11 LAB — CBC
HCT: 26.1 % — ABNORMAL LOW (ref 39.0–52.0)
Hemoglobin: 8.6 g/dL — ABNORMAL LOW (ref 13.0–17.0)
MCV: 73.1 fL — ABNORMAL LOW (ref 78.0–100.0)
RBC: 3.57 MIL/uL — ABNORMAL LOW (ref 4.22–5.81)
WBC: 11.9 10*3/uL — ABNORMAL HIGH (ref 4.0–10.5)

## 2010-10-11 LAB — URINALYSIS, ROUTINE W REFLEX MICROSCOPIC
Bilirubin Urine: NEGATIVE
Ketones, ur: NEGATIVE mg/dL
Nitrite: NEGATIVE
pH: 5.5 (ref 5.0–8.0)

## 2010-10-12 LAB — URINE CULTURE: Culture  Setup Time: 201206210117

## 2010-10-17 LAB — CULTURE, BLOOD (ROUTINE X 2)
Culture  Setup Time: 201206202227
Culture: NO GROWTH

## 2010-10-20 ENCOUNTER — Other Ambulatory Visit: Payer: Self-pay | Admitting: Hematology and Oncology

## 2010-10-20 ENCOUNTER — Encounter (HOSPITAL_BASED_OUTPATIENT_CLINIC_OR_DEPARTMENT_OTHER): Payer: Medicare Other | Admitting: Hematology and Oncology

## 2010-10-20 DIAGNOSIS — D472 Monoclonal gammopathy: Secondary | ICD-10-CM

## 2010-10-20 DIAGNOSIS — D539 Nutritional anemia, unspecified: Secondary | ICD-10-CM

## 2010-10-20 LAB — HIV ANTIBODY (ROUTINE TESTING W REFLEX): HIV: NONREACTIVE

## 2010-12-13 ENCOUNTER — Encounter: Payer: Self-pay | Admitting: *Deleted

## 2010-12-19 ENCOUNTER — Encounter: Payer: Self-pay | Admitting: Internal Medicine

## 2010-12-19 ENCOUNTER — Ambulatory Visit (INDEPENDENT_AMBULATORY_CARE_PROVIDER_SITE_OTHER): Payer: Medicare Other | Admitting: Internal Medicine

## 2010-12-19 VITALS — BP 126/58 | HR 88

## 2010-12-19 DIAGNOSIS — I10 Essential (primary) hypertension: Secondary | ICD-10-CM

## 2010-12-19 DIAGNOSIS — IMO0001 Reserved for inherently not codable concepts without codable children: Secondary | ICD-10-CM

## 2010-12-19 DIAGNOSIS — N058 Unspecified nephritic syndrome with other morphologic changes: Secondary | ICD-10-CM

## 2010-12-19 DIAGNOSIS — Z531 Procedure and treatment not carried out because of patient's decision for reasons of belief and group pressure: Secondary | ICD-10-CM

## 2010-12-19 DIAGNOSIS — E1121 Type 2 diabetes mellitus with diabetic nephropathy: Secondary | ICD-10-CM

## 2010-12-19 DIAGNOSIS — E1129 Type 2 diabetes mellitus with other diabetic kidney complication: Secondary | ICD-10-CM

## 2010-12-19 DIAGNOSIS — Z1211 Encounter for screening for malignant neoplasm of colon: Secondary | ICD-10-CM

## 2010-12-19 DIAGNOSIS — D472 Monoclonal gammopathy: Secondary | ICD-10-CM

## 2010-12-19 DIAGNOSIS — R630 Anorexia: Secondary | ICD-10-CM

## 2010-12-19 DIAGNOSIS — D649 Anemia, unspecified: Secondary | ICD-10-CM

## 2010-12-19 MED ORDER — PEG-KCL-NACL-NASULF-NA ASC-C 100 G PO SOLR: 1.0000 | Freq: Once | ORAL | Status: DC

## 2010-12-19 NOTE — Progress Notes (Signed)
  Subjective:    Patient ID: Roger Wood, male    DOB: [birth year], 70 y.o.   MRN: [id number]  HPI Elderly man here for evaluation of anemia. Recent diagnosis of prostate cancer and recent hospitalization (May) with fever, myalgias and joint effusions. Had PT after that. Hgb 11 in January and now 7-8 without bleeding, abdominal pain or change in bowels. Has seen a hematologist re MGUS. Name complaints are fatigue and weakness.He has anorexia which is intermittent over the past year since that initial hospitalization. He is unable to walk well due to inability to extend his lower extremities completely.    Review of Systems As above and all other review of systems negative.    Objective:   Physical Exam General: Chronically ill the wheelchair no acute distress Vitals: Reviewed and listed above Eyes:anicteric. Mouth and posterior pharynx: normal.  Neck: supple w/o thyromegaly or mass.  Lungs: clear. Heart: S1S2, no rubs, murmurs, gallops. Abdomen: soft, non-tender, no hepatosplenomegaly, hernia, or mass and BS+.  Rectal: Deferred Lymphatics: no cervical, [place] nodes  Extremities:  no edema, unable to fully extend lower extremities. Knees have bony hypertrophy and mildly tender left greater than right. Skin no rash in upper trunk Neuro:  A&O x 3.  Psych: appropriate mood and  affect.        Assessment & Plan:

## 2010-12-19 NOTE — Patient Instructions (Signed)
You have been scheduled for a Colonoscopy with separate instructions given. Your prep kit has been sent to your pharmacy for you to pick up. 

## 2010-12-20 ENCOUNTER — Ambulatory Visit
Admission: RE | Admit: 2010-12-20 | Discharge: 2010-12-20 | Disposition: A | Discharge status: Home / Self Care | Payer: Medicare Other | Source: Ambulatory Visit | Attending: Radiation Oncology | Admitting: Radiation Oncology

## 2010-12-20 ENCOUNTER — Encounter: Payer: Self-pay | Admitting: Internal Medicine

## 2010-12-20 DIAGNOSIS — C61 Malignant neoplasm of prostate: Secondary | ICD-10-CM | POA: Insufficient documentation

## 2010-12-20 DIAGNOSIS — K5900 Constipation, unspecified: Secondary | ICD-10-CM | POA: Insufficient documentation

## 2010-12-20 DIAGNOSIS — M79609 Pain in unspecified limb: Secondary | ICD-10-CM | POA: Insufficient documentation

## 2010-12-20 DIAGNOSIS — I10 Essential (primary) hypertension: Secondary | ICD-10-CM | POA: Insufficient documentation

## 2010-12-20 DIAGNOSIS — R630 Anorexia: Secondary | ICD-10-CM | POA: Insufficient documentation

## 2010-12-20 DIAGNOSIS — D649 Anemia, unspecified: Secondary | ICD-10-CM | POA: Insufficient documentation

## 2010-12-20 DIAGNOSIS — Z510 Encounter for antineoplastic radiation therapy: Secondary | ICD-10-CM | POA: Insufficient documentation

## 2010-12-20 DIAGNOSIS — IMO0001 Reserved for inherently not codable concepts without codable children: Secondary | ICD-10-CM | POA: Insufficient documentation

## 2010-12-20 DIAGNOSIS — Z79899 Other long term (current) drug therapy: Secondary | ICD-10-CM | POA: Insufficient documentation

## 2010-12-20 DIAGNOSIS — E1121 Type 2 diabetes mellitus with diabetic nephropathy: Secondary | ICD-10-CM | POA: Insufficient documentation

## 2010-12-20 DIAGNOSIS — Z862 Personal history of diseases of the blood and blood-forming organs and certain disorders involving the immune mechanism: Secondary | ICD-10-CM | POA: Insufficient documentation

## 2010-12-20 DIAGNOSIS — D472 Monoclonal gammopathy: Secondary | ICD-10-CM | POA: Insufficient documentation

## 2010-12-20 DIAGNOSIS — Z794 Long term (current) use of insulin: Secondary | ICD-10-CM | POA: Insufficient documentation

## 2010-12-20 DIAGNOSIS — Z8639 Personal history of other endocrine, nutritional and metabolic disease: Secondary | ICD-10-CM | POA: Insufficient documentation

## 2010-12-20 DIAGNOSIS — E119 Type 2 diabetes mellitus without complications: Secondary | ICD-10-CM | POA: Insufficient documentation

## 2010-12-20 DIAGNOSIS — Z531 Procedure and treatment not carried out because of patient's decision for reasons of belief and group pressure: Secondary | ICD-10-CM | POA: Insufficient documentation

## 2010-12-20 NOTE — Assessment & Plan Note (Signed)
Hgb 11 in Jan and now 8.6. It is microcytic also. Could be chronic disease with RA or renal insufficiency. EGD and colonoscopy are appropriate to look for GI causes.

## 2010-12-20 NOTE — Assessment & Plan Note (Signed)
Likely multifactorial. EGD will help sort out if there is an upper GI tract problem. Colonoscopy less likely to help.

## 2011-01-13 ENCOUNTER — Emergency Department (HOSPITAL_COMMUNITY)
Admission: EM | Admit: 2011-01-13 | Discharge: 2011-01-14 | Disposition: A | Discharge status: Home / Self Care | Payer: Medicare Other | Attending: Emergency Medicine | Admitting: Emergency Medicine

## 2011-01-13 ENCOUNTER — Emergency Department (HOSPITAL_COMMUNITY): Payer: Medicare Other

## 2011-01-13 DIAGNOSIS — Z794 Long term (current) use of insulin: Secondary | ICD-10-CM | POA: Insufficient documentation

## 2011-01-13 DIAGNOSIS — R110 Nausea: Secondary | ICD-10-CM | POA: Insufficient documentation

## 2011-01-13 DIAGNOSIS — R509 Fever, unspecified: Secondary | ICD-10-CM | POA: Insufficient documentation

## 2011-01-13 DIAGNOSIS — I10 Essential (primary) hypertension: Secondary | ICD-10-CM | POA: Insufficient documentation

## 2011-01-13 DIAGNOSIS — R079 Chest pain, unspecified: Secondary | ICD-10-CM | POA: Insufficient documentation

## 2011-01-13 DIAGNOSIS — E119 Type 2 diabetes mellitus without complications: Secondary | ICD-10-CM | POA: Insufficient documentation

## 2011-01-13 DIAGNOSIS — M109 Gout, unspecified: Secondary | ICD-10-CM | POA: Insufficient documentation

## 2011-01-13 DIAGNOSIS — M069 Rheumatoid arthritis, unspecified: Secondary | ICD-10-CM | POA: Insufficient documentation

## 2011-01-13 LAB — CBC
HCT: 34.1 % — ABNORMAL LOW (ref 39.0–52.0)
MCV: 76.3 fL — ABNORMAL LOW (ref 78.0–100.0)
RBC: 4.47 MIL/uL (ref 4.22–5.81)
RDW: 18.7 % — ABNORMAL HIGH (ref 11.5–15.5)
WBC: 10.7 10*3/uL — ABNORMAL HIGH (ref 4.0–10.5)

## 2011-01-13 LAB — COMPREHENSIVE METABOLIC PANEL
AST: 19 U/L (ref 0–37)
Albumin: 2.5 g/dL — ABNORMAL LOW (ref 3.5–5.2)
Alkaline Phosphatase: 111 U/L (ref 39–117)
BUN: 14 mg/dL (ref 6–23)
Creatinine, Ser: 1.08 mg/dL (ref 0.50–1.35)
Potassium: 3.9 mEq/L (ref 3.5–5.1)
Total Protein: 7.1 g/dL (ref 6.0–8.3)

## 2011-01-13 LAB — URINE MICROSCOPIC-ADD ON

## 2011-01-13 LAB — URINALYSIS, ROUTINE W REFLEX MICROSCOPIC
Glucose, UA: NEGATIVE mg/dL
Leukocytes, UA: NEGATIVE
Nitrite: NEGATIVE
Specific Gravity, Urine: 1.014 (ref 1.005–1.030)
pH: 5.5 (ref 5.0–8.0)

## 2011-01-13 LAB — TROPONIN I: Troponin I: <0.3 ng/mL (ref <0.30)

## 2011-01-13 LAB — DIFFERENTIAL
Basophils Absolute: 0 10*3/uL (ref 0.0–0.1)
Eosinophils Relative: 0 % (ref 0–5)
Lymphocytes Relative: 15 % (ref 12–46)
Lymphs Abs: 1.6 10*3/uL (ref 0.7–4.0)
Neutro Abs: 8 10*3/uL — ABNORMAL HIGH (ref 1.7–7.7)
Neutrophils Relative %: 74 % (ref 43–77)

## 2011-01-13 LAB — APTT: aPTT: 41 seconds — ABNORMAL HIGH (ref 24–37)

## 2011-01-13 LAB — PROTIME-INR: INR: 1.23 (ref 0.00–1.49)

## 2011-01-13 LAB — POCT I-STAT TROPONIN I: Troponin i, poc: 0 ng/mL (ref 0.00–0.08)

## 2011-01-13 LAB — CK: Total CK: 24 U/L (ref 7–232)

## 2011-01-13 LAB — LACTIC ACID, PLASMA: Lactic Acid, Venous: 1.1 mmol/L (ref 0.5–2.2)

## 2011-01-15 ENCOUNTER — Telehealth: Payer: Self-pay | Admitting: Internal Medicine

## 2011-01-15 LAB — URINE CULTURE
Colony Count: 9000
Culture  Setup Time: 201209230155

## 2011-01-15 MED ORDER — PEG-KCL-NACL-NASULF-NA ASC-C 100 G PO SOLR: 1.0000 | Freq: Once | ORAL | Status: DC

## 2011-01-15 NOTE — Telephone Encounter (Signed)
Another prep kit sent to the pharmacy. Patient took the Moviprep at the wrong time.

## 2011-01-20 LAB — CULTURE, BLOOD (ROUTINE X 2)
Culture  Setup Time: 201209230110
Culture: NO GROWTH
Culture: NO GROWTH

## 2011-01-29 ENCOUNTER — Encounter (HOSPITAL_BASED_OUTPATIENT_CLINIC_OR_DEPARTMENT_OTHER): Payer: Medicare Other | Admitting: Hematology and Oncology

## 2011-01-29 ENCOUNTER — Other Ambulatory Visit: Payer: Self-pay | Admitting: Hematology and Oncology

## 2011-01-29 DIAGNOSIS — D539 Nutritional anemia, unspecified: Secondary | ICD-10-CM

## 2011-01-29 LAB — CBC WITH DIFFERENTIAL/PLATELET
Eosinophils Absolute: 0.1 10*3/uL (ref 0.0–0.5)
MONO#: 0.7 10*3/uL (ref 0.1–0.9)
NEUT#: 7.5 10*3/uL — ABNORMAL HIGH (ref 1.5–6.5)
RBC: 3.13 10*6/uL — ABNORMAL LOW (ref 4.20–5.82)
RDW: 19.3 % — ABNORMAL HIGH (ref 11.0–14.6)
WBC: 9.8 10*3/uL (ref 4.0–10.3)

## 2011-01-29 LAB — BASIC METABOLIC PANEL
CO2: 25 mEq/L (ref 19–32)
Chloride: 100 mEq/L (ref 96–112)
Glucose, Bld: 245 mg/dL — ABNORMAL HIGH (ref 70–99)
Potassium: 4.1 mEq/L (ref 3.5–5.3)
Sodium: 135 mEq/L (ref 135–145)

## 2011-01-29 LAB — FERRITIN: Ferritin: 1207 ng/mL — ABNORMAL HIGH (ref 22–322)

## 2011-01-29 LAB — IRON AND TIBC: %SAT: 12 % — ABNORMAL LOW (ref 20–55)

## 2011-01-30 ENCOUNTER — Encounter: Payer: Medicare Other | Admitting: Internal Medicine

## 2011-01-31 ENCOUNTER — Encounter (HOSPITAL_BASED_OUTPATIENT_CLINIC_OR_DEPARTMENT_OTHER): Payer: Medicare Other | Admitting: Hematology and Oncology

## 2011-01-31 DIAGNOSIS — R030 Elevated blood-pressure reading, without diagnosis of hypertension: Secondary | ICD-10-CM

## 2011-01-31 DIAGNOSIS — C61 Malignant neoplasm of prostate: Secondary | ICD-10-CM

## 2011-01-31 DIAGNOSIS — D509 Iron deficiency anemia, unspecified: Secondary | ICD-10-CM

## 2011-01-31 DIAGNOSIS — R7309 Other abnormal glucose: Secondary | ICD-10-CM

## 2011-02-08 ENCOUNTER — Encounter: Payer: Self-pay | Admitting: Radiation Oncology

## 2011-02-26 ENCOUNTER — Ambulatory Visit
Admission: RE | Admit: 2011-02-26 | Discharge: 2011-02-26 | Disposition: A | Discharge status: Home / Self Care | Payer: Medicare Other | Source: Ambulatory Visit | Attending: Radiation Oncology | Admitting: Radiation Oncology

## 2011-02-27 ENCOUNTER — Ambulatory Visit
Admission: RE | Admit: 2011-02-27 | Discharge: 2011-02-27 | Disposition: A | Discharge status: Home / Self Care | Payer: Medicare Other | Source: Ambulatory Visit | Attending: Radiation Oncology | Admitting: Radiation Oncology

## 2011-02-28 ENCOUNTER — Ambulatory Visit
Admission: RE | Admit: 2011-02-28 | Discharge: 2011-02-28 | Disposition: A | Discharge status: Home / Self Care | Payer: Medicare Other | Source: Ambulatory Visit | Attending: Radiation Oncology | Admitting: Radiation Oncology

## 2011-03-01 ENCOUNTER — Ambulatory Visit
Admission: RE | Admit: 2011-03-01 | Discharge: 2011-03-01 | Disposition: A | Discharge status: Home / Self Care | Payer: Medicare Other | Source: Ambulatory Visit | Attending: Radiation Oncology | Admitting: Radiation Oncology

## 2011-03-02 ENCOUNTER — Ambulatory Visit
Admission: RE | Admit: 2011-03-02 | Discharge: 2011-03-02 | Disposition: A | Discharge status: Home / Self Care | Payer: Medicare Other | Source: Ambulatory Visit | Attending: Radiation Oncology | Admitting: Radiation Oncology

## 2011-03-02 DIAGNOSIS — C61 Malignant neoplasm of prostate: Secondary | ICD-10-CM

## 2011-03-02 NOTE — Progress Notes (Signed)
Pt denies problems except some fatigue.

## 2011-03-02 NOTE — Progress Notes (Signed)
Saw pcp last week, states nothing wrong, no meds needed.

## 2011-03-02 NOTE — Progress Notes (Signed)
Weekly Radiation Therapy Management  Current Dose: 47.5 Gy     Planned Dose:  76 Gy  Narrative . . . . . . . .The patient presents for routine under treatment assessment.   Port film x-rays were reviewed.  The chart was checked. Physical Findings. . Weight Stable.  No significant changes. Impression . . . . . . . The patient is  tolerating radiation. Plan . . . . . . . . . .  Continue treatment as planned.

## 2011-03-05 ENCOUNTER — Ambulatory Visit
Admission: RE | Admit: 2011-03-05 | Discharge: 2011-03-05 | Disposition: A | Discharge status: Home / Self Care | Payer: Medicare Other | Source: Ambulatory Visit | Attending: Radiation Oncology | Admitting: Radiation Oncology

## 2011-03-06 ENCOUNTER — Ambulatory Visit
Admission: RE | Admit: 2011-03-06 | Discharge: 2011-03-06 | Disposition: A | Discharge status: Home / Self Care | Payer: Medicare Other | Source: Ambulatory Visit | Attending: Radiation Oncology | Admitting: Radiation Oncology

## 2011-03-07 ENCOUNTER — Ambulatory Visit
Admission: RE | Admit: 2011-03-07 | Discharge: 2011-03-07 | Disposition: A | Discharge status: Home / Self Care | Payer: Medicare Other | Source: Ambulatory Visit | Attending: Radiation Oncology | Admitting: Radiation Oncology

## 2011-03-08 ENCOUNTER — Ambulatory Visit
Admission: RE | Admit: 2011-03-08 | Discharge: 2011-03-08 | Disposition: A | Discharge status: Home / Self Care | Payer: Medicare Other | Source: Ambulatory Visit | Attending: Radiation Oncology | Admitting: Radiation Oncology

## 2011-03-09 ENCOUNTER — Ambulatory Visit
Admission: RE | Admit: 2011-03-09 | Discharge: 2011-03-09 | Disposition: A | Discharge status: Home / Self Care | Payer: Medicare Other | Source: Ambulatory Visit | Attending: Radiation Oncology | Admitting: Radiation Oncology

## 2011-03-09 DIAGNOSIS — C61 Malignant neoplasm of prostate: Secondary | ICD-10-CM

## 2011-03-09 NOTE — Progress Notes (Signed)
  Radiation Oncology         (336) [phone] ________________________________  Name: Roger Wood MRN: [id number]  Date: 03/09/2011  DOB: 11/11/1940  Weekly Radiation Therapy Management  Current Dose: 57 Gy     Planned Dose:  76 Gy  Narrative . . . . . . . . The patient presents for routine under treatment assessment.        The patient is without complaint.                                 Set-up films were reviewed.                                 The chart was checked. Physical Findings. . . Weight essentially stable.  No significant changes. Impression . . . . . . . The patient is  tolerating radiation. Plan . . . . . . . . . . . . Continue treatment as planned.  ________________________________  Artist Pais. Kathrynn Running, M.D.

## 2011-03-09 NOTE — Progress Notes (Signed)
Pt reports he is same as last week, some fatigue. Denies dysuria, no increase urinary freq.,bm's nl.

## 2011-03-10 ENCOUNTER — Ambulatory Visit
Admission: RE | Admit: 2011-03-10 | Discharge: 2011-03-10 | Disposition: A | Discharge status: Home / Self Care | Payer: Medicare Other | Source: Ambulatory Visit | Attending: Radiation Oncology | Admitting: Radiation Oncology

## 2011-03-12 ENCOUNTER — Ambulatory Visit: Payer: Medicare Other

## 2011-03-12 ENCOUNTER — Ambulatory Visit
Admission: RE | Admit: 2011-03-12 | Discharge: 2011-03-12 | Disposition: A | Discharge status: Home / Self Care | Payer: Medicare Other | Source: Ambulatory Visit | Attending: Radiation Oncology | Admitting: Radiation Oncology

## 2011-03-13 ENCOUNTER — Ambulatory Visit: Payer: Medicare Other

## 2011-03-13 ENCOUNTER — Ambulatory Visit
Admission: RE | Admit: 2011-03-13 | Discharge: 2011-03-13 | Disposition: A | Discharge status: Home / Self Care | Payer: Medicare Other | Source: Ambulatory Visit | Attending: Radiation Oncology | Admitting: Radiation Oncology

## 2011-03-14 ENCOUNTER — Ambulatory Visit
Admission: RE | Admit: 2011-03-14 | Discharge: 2011-03-14 | Disposition: A | Discharge status: Home / Self Care | Payer: Medicare Other | Source: Ambulatory Visit | Attending: Radiation Oncology | Admitting: Radiation Oncology

## 2011-03-14 ENCOUNTER — Ambulatory Visit: Payer: Medicare Other

## 2011-03-16 ENCOUNTER — Ambulatory Visit: Payer: Medicare Other

## 2011-03-19 ENCOUNTER — Ambulatory Visit
Admission: RE | Admit: 2011-03-19 | Discharge: 2011-03-19 | Disposition: A | Discharge status: Home / Self Care | Payer: Medicare Other | Source: Ambulatory Visit | Attending: Radiation Oncology | Admitting: Radiation Oncology

## 2011-03-19 DIAGNOSIS — C61 Malignant neoplasm of prostate: Secondary | ICD-10-CM

## 2011-03-19 NOTE — Progress Notes (Addendum)
  Radiation Oncology         (336) [phone] ________________________________  Name: Roger Wood MRN: [id number]  Date: 03/19/2011  DOB: [birth year]  Weekly Radiation Therapy Management  Current Dose: 66.5 Gy     Planned Dose:  76 Gy  Narrative . . . . . . . . The patient presents for routine under treatment assessment.    Pt denies urinary or bowel issues related to tx. States he had some constipation yesterday, will take metamucil.  Pt states he "drank too many sodas over weekend and his knees are bothering him today".   The patient is without complaint.                                 Set-up films were reviewed.                                 The chart was checked. Physical Findings. . . Weight essentially stable.  No significant changes. Impression . . . . . . . The patient is  tolerating radiation. Plan . . . . . . . . . . . . Continue treatment as planned.  ________________________________  Artist Pais. Kathrynn Running, M.D.

## 2011-03-20 ENCOUNTER — Ambulatory Visit
Admission: RE | Admit: 2011-03-20 | Discharge: 2011-03-20 | Disposition: A | Discharge status: Home / Self Care | Payer: Medicare Other | Source: Ambulatory Visit | Attending: Radiation Oncology | Admitting: Radiation Oncology

## 2011-03-20 DIAGNOSIS — Z510 Encounter for antineoplastic radiation therapy: Secondary | ICD-10-CM | POA: Insufficient documentation

## 2011-03-20 DIAGNOSIS — C61 Malignant neoplasm of prostate: Secondary | ICD-10-CM | POA: Insufficient documentation

## 2011-03-21 ENCOUNTER — Ambulatory Visit
Admission: RE | Admit: 2011-03-21 | Discharge: 2011-03-21 | Disposition: A | Discharge status: Home / Self Care | Payer: Medicare Other | Source: Ambulatory Visit | Attending: Radiation Oncology | Admitting: Radiation Oncology

## 2011-03-22 ENCOUNTER — Ambulatory Visit
Admission: RE | Admit: 2011-03-22 | Discharge: 2011-03-22 | Disposition: A | Discharge status: Home / Self Care | Payer: Medicare Other | Source: Ambulatory Visit | Attending: Radiation Oncology | Admitting: Radiation Oncology

## 2011-03-22 DIAGNOSIS — C61 Malignant neoplasm of prostate: Secondary | ICD-10-CM

## 2011-03-22 NOTE — Progress Notes (Signed)
Pt denies any issues w/bowel, bladder. Pt does not wish to weigh again due to knee pain. Pt completes Mon,has fu appt card.

## 2011-03-22 NOTE — Progress Notes (Signed)
[facility] Radiation Oncology Weekly Treatment Note    Name: Roger Wood Date: 03/22/2011 MRN: [id number] DOB: Roger Wood  Status:outpatient    Current dose: 7220  Current fraction:38  Planned dose:7600  Planned fraction:40   MEDICATIONS: Current Outpatient Prescriptions  Medication Sig Dispense Refill  . acetaminophen (TYLENOL) 650 MG CR tablet Take 650 mg by mouth every 4 (four) hours as needed.        Marland Kitchen amLODipine (NORVASC) 10 MG tablet Take 10 mg by mouth daily.        . colchicine 0.6 MG tablet Take 0.6 mg by mouth daily.        Marland Kitchen docusate sodium (COLACE) 100 MG capsule Take 100 mg by mouth 2 (two) times daily.        . hydrALAZINE (APRESOLINE) 50 MG tablet Take 50 mg by mouth 3 (three) times daily.        Marland Kitchen HYDROcodone-acetaminophen (NORCO) 5-325 MG per tablet Take 1 tablet by mouth every 4 (four) hours as needed.        . insulin glargine (LANTUS) 100 UNIT/ML injection Inject 10 Units into the skin at bedtime.        Marland Kitchen lisinopril (PRINIVIL,ZESTRIL) 20 MG tablet Take 20 mg by mouth daily.        Marland Kitchen lovastatin (MEVACOR) 40 MG tablet Take 40 mg by mouth at bedtime.        . metoprolol (TOPROL-XL) 50 MG 24 hr tablet Take 75 mg by mouth daily.        . peg 3350 powder (MOVIPREP) 100 G SOLR Take 1 kit (100 g total) by mouth once.  1 kit  0     ALLERGIES: Review of patient's allergies indicates no known allergies.   LABORATORY DATA:  Lab Results  Component Value Date   WBC 9.8 01/29/2011   HGB 8.1* 01/29/2011   HCT 24.7* 01/29/2011   MCV 78.9* 01/29/2011   PLT 551* 01/29/2011   Lab Results  Component Value Date   NA 135 01/29/2011   NA 135 01/29/2011   NA 135 01/29/2011   K 4.1 01/29/2011   K 4.1 01/29/2011   K 4.1 01/29/2011   CL 100 01/29/2011   CL 100 01/29/2011   CL 100 01/29/2011   CO2 25 01/29/2011   CO2 25 01/29/2011   CO2 25 01/29/2011   Lab Results  Component Value Date   ALT 23 01/13/2011   AST 19 01/13/2011   ALKPHOS 111 01/13/2011   BILITOT  0.6 01/13/2011      NARRATIVE: Roger Wood was seen today for weekly treatment management. The chart was checked and CBCT images were reviewed. Pt doing very well. No new problems. No GI/ GU complaints.  PHYSICAL EXAMINATION: vitals were not taken for this visit.     ASSESSMENT: Patient tolerating treatments well.    PLAN: Continue treatment as planned.

## 2011-03-23 ENCOUNTER — Ambulatory Visit
Admission: RE | Admit: 2011-03-23 | Discharge: 2011-03-23 | Disposition: A | Discharge status: Home / Self Care | Payer: Medicare Other | Source: Ambulatory Visit | Attending: Radiation Oncology | Admitting: Radiation Oncology

## 2011-03-26 ENCOUNTER — Ambulatory Visit
Admission: RE | Admit: 2011-03-26 | Discharge: 2011-03-26 | Disposition: A | Discharge status: Home / Self Care | Payer: Medicare Other | Source: Ambulatory Visit | Attending: Radiation Oncology | Admitting: Radiation Oncology

## 2011-03-26 NOTE — Progress Notes (Signed)
Pt completed today. Denies any problems related to tx except some fatigue. Has 1 month fu appt scheduled.

## 2011-04-02 ENCOUNTER — Encounter: Payer: Self-pay | Admitting: Radiation Oncology

## 2011-04-02 NOTE — Progress Notes (Signed)
  Radiation Oncology         [phone] ________________________________ [facility] Radiation Oncology  Name: Roger Wood MRN: [id number]  Date: 04/02/2011  DOB: Jun 20, 1940  CC: Lindaann Slough, M.D.  Georgianne Fick, M.D.   REFERRING PHYSICIAN:  Lindaann Slough, M.D.  DIAGNOSIS:  70 year old gentleman with stage T1c adenocarcinoma of the prostate with Gleason score of 3+3 and a PSA of 8.11.  INDICATION FOR TREATMENT: Curative   TREATMENT DATES:  10/6-12/3/12   SITE/DOSE:  76 Gy in 40 fractions of 1.8 Gy to the prostate   BEAMS/ENERGY:  Two RapidArc beams were used to deliver IMRT with 10 MV flattening filter free photons.  IGRT was performed with orthogonal kv imaging.  NARRATIVE:  The patient tolerated radiation without significant complications.   PLAN: Routine followup in one month. Patient instructed to call if questions or worsening complaints in interim.  ________________________________  Artist Pais Kathrynn Running, M.D.

## 2011-04-13 ENCOUNTER — Encounter: Payer: Self-pay | Admitting: *Deleted

## 2011-04-13 DIAGNOSIS — D649 Anemia, unspecified: Secondary | ICD-10-CM | POA: Insufficient documentation

## 2011-04-13 NOTE — Progress Notes (Signed)
Follow up  Prostate Ca Gleason 3+3=6,Psa 8.11  Radiation therapy 01/27/11-03/26/11   Married, Jehovah witness, truck Hospital doctor

## 2011-04-20 ENCOUNTER — Telehealth: Payer: Self-pay | Admitting: Hematology and Oncology

## 2011-04-20 NOTE — Telephone Encounter (Signed)
S/w the pt and she is aware of the jan 2013 appts °

## 2011-04-26 ENCOUNTER — Ambulatory Visit
Admission: RE | Admit: 2011-04-26 | Discharge: 2011-04-26 | Disposition: A | Discharge status: Home / Self Care | Payer: Medicare Other | Source: Ambulatory Visit | Attending: Radiation Oncology | Admitting: Radiation Oncology

## 2011-04-26 ENCOUNTER — Encounter: Payer: Self-pay | Admitting: Radiation Oncology

## 2011-04-26 VITALS — BP 187/77 | HR 64 | Temp 97.4°F | Resp 20 | Wt 216.2 lb

## 2011-04-26 DIAGNOSIS — C61 Malignant neoplasm of prostate: Secondary | ICD-10-CM

## 2011-04-26 NOTE — Progress Notes (Signed)
  Radiation Oncology         [phone] ________________________________  Name: Roger Wood MRN: [id number]  Date: 04/26/2011  DOB: October 07, 1940       Follow-Up Visit Note  CC:  Lindaann Slough, M.D.  Georgianne Fick, M.D.    REFERRING PHYSICIAN: Lindaann Slough, M.D.   DIAGNOSIS: 71 year old gentleman with stage T1c adenocarcinoma of the prostate with Gleason score of 3+3 and a PSA of 8.11.    Interval Since Last Radiation:  1 months  Narrative . . . . . . . . The patient returns today for routine follow-up.  His bowels and bladder are back to normal.  He has no complaint.                              ALLERGIES:   has no known allergies.  Meds. . . . . . . . . . . . Current Outpatient Prescriptions  Medication Sig Dispense Refill  . acetaminophen (TYLENOL) 650 MG CR tablet Take 650 mg by mouth every 4 (four) hours as needed.        Marland Kitchen amLODipine (NORVASC) 10 MG tablet Take 10 mg by mouth daily.        . colchicine 0.6 MG tablet Take 0.6 mg by mouth daily.        Marland Kitchen docusate sodium (COLACE) 100 MG capsule Take 100 mg by mouth 2 (two) times daily.        . hydrALAZINE (APRESOLINE) 50 MG tablet Take 50 mg by mouth 3 (three) times daily.        Marland Kitchen HYDROcodone-acetaminophen (NORCO) 5-325 MG per tablet Take 1 tablet by mouth every 4 (four) hours as needed.        . insulin glargine (LANTUS) 100 UNIT/ML injection Inject 10 Units into the skin at bedtime.        Marland Kitchen lisinopril (PRINIVIL,ZESTRIL) 20 MG tablet Take 20 mg by mouth daily.        Marland Kitchen lovastatin (MEVACOR) 40 MG tablet Take 40 mg by mouth at bedtime.        . metoprolol (TOPROL-XL) 50 MG 24 hr tablet Take 75 mg by mouth daily.        . peg 3350 powder (MOVIPREP) 100 G SOLR Take 1 kit (100 g total) by mouth once.  1 kit  0    Physical Findings. . .  weight is 216 lb 3.2 oz (98.068 kg). His oral temperature is 97.4 F (36.3 C). His blood pressure is 187/77 and his pulse is 64. His respiration is 20. Marland Kitchen  No significant  changes.  Lab Findings . . . . .  Lab Results  Component Value Date   WBC 9.8 01/29/2011   HGB 8.1* 01/29/2011   HCT 24.7* 01/29/2011   MCV 78.9* 01/29/2011   PLT 551* 01/29/2011    Impression . . . . . . . The patient is recovering from the effects of radiation.  His blood pressure is high.  He took gout medicine last night and attributes the BP to this.  Plan . . . . . . . . . . . . The patient will follow with Dr. Brunilda Payor for PSA determinations.  He'll follow-up here as needed.  _____________________________________  Artist Pais Kathrynn Running, M.D.

## 2011-04-26 NOTE — Progress Notes (Addendum)
Pt here f/u prosate cancer I=pss   STILL HAVING FREQUENCY OF VOIDING, GETS THROUGH THE NIGHT 5-6HOURS, DAYTIME GOES 3X DAY, NO DYSURIA, EATING WELL AND DRINKING ENOUGH FLUIDS, EMPTIES BLADDER COMPLETELY WHEN VOIDING 11:38 AM I-PSS=[id number] SCORE=6

## 2011-05-10 ENCOUNTER — Other Ambulatory Visit: Payer: Medicare Other | Admitting: Lab

## 2011-05-11 ENCOUNTER — Ambulatory Visit (HOSPITAL_BASED_OUTPATIENT_CLINIC_OR_DEPARTMENT_OTHER): Payer: Medicare Other

## 2011-05-11 DIAGNOSIS — D539 Nutritional anemia, unspecified: Secondary | ICD-10-CM

## 2011-05-11 DIAGNOSIS — C61 Malignant neoplasm of prostate: Secondary | ICD-10-CM

## 2011-05-11 DIAGNOSIS — D472 Monoclonal gammopathy: Secondary | ICD-10-CM

## 2011-05-11 LAB — CBC WITH DIFFERENTIAL/PLATELET
Basophils Absolute: 0 10*3/uL (ref 0.0–0.1)
EOS%: 1.6 % (ref 0.0–7.0)
Eosinophils Absolute: 0.1 10*3/uL (ref 0.0–0.5)
HGB: 9.6 g/dL — ABNORMAL LOW (ref 13.0–17.1)
MCH: 25.6 pg — ABNORMAL LOW (ref 27.2–33.4)
NEUT#: 4.9 10*3/uL (ref 1.5–6.5)
RDW: 22.5 % — ABNORMAL HIGH (ref 11.0–14.6)
lymph#: 0.8 10*3/uL — ABNORMAL LOW (ref 0.9–3.3)

## 2011-05-11 LAB — BASIC METABOLIC PANEL
BUN: 23 mg/dL (ref 6–23)
Chloride: 102 mEq/L (ref 96–112)
Potassium: 3.6 mEq/L (ref 3.5–5.3)

## 2011-05-12 LAB — FERRITIN: Ferritin: 1176 ng/mL — ABNORMAL HIGH (ref 22–322)

## 2011-05-12 LAB — IRON AND TIBC
%SAT: 12 % — ABNORMAL LOW (ref 20–55)
TIBC: 193 ug/dL — ABNORMAL LOW (ref 215–435)
UIBC: 170 ug/dL (ref 125–400)

## 2011-05-12 LAB — VITAMIN B12: Vitamin B-12: 331 pg/mL (ref 211–911)

## 2011-05-14 ENCOUNTER — Encounter: Payer: Self-pay | Admitting: *Deleted

## 2011-05-15 ENCOUNTER — Ambulatory Visit (HOSPITAL_BASED_OUTPATIENT_CLINIC_OR_DEPARTMENT_OTHER): Payer: Medicare Other | Admitting: Hematology and Oncology

## 2011-05-15 ENCOUNTER — Telehealth: Payer: Self-pay | Admitting: Hematology and Oncology

## 2011-05-15 VITALS — BP 182/84 | HR 86 | Temp 97.7°F | Ht 72.0 in | Wt 216.3 lb

## 2011-05-15 DIAGNOSIS — D509 Iron deficiency anemia, unspecified: Secondary | ICD-10-CM

## 2011-05-15 DIAGNOSIS — D539 Nutritional anemia, unspecified: Secondary | ICD-10-CM

## 2011-05-15 NOTE — Progress Notes (Signed)
CC:   Georgianne Fick, M.D. Lindaann Slough, M.D. Oneita Hurt, M.D.  IDENTIFYING STATEMENT:  The patient is a 71 year old man with anemia who presents for followup.  INTERVAL HISTORY:  The patient reports he was diagnosed with a T1c adenocarcinoma of the prostate, Gleason score 3 + 3 and PSA of 8.11.  He received localized radiation therapy.  He has not required any IV iron since his last visit.  He is asymptomatic.  He is not short of breath.  MEDICATIONS:  Reviewed and updated.  ALLERGIES:  None.  PHYSICAL EXAM:  General: Alert and oriented x3.  vitals: Pulse 86, blood pressure 182/84, temperature 97.7, respirations 20, and weight 160 pounds.  HEENT: Head is atraumatic, normocephalic.  Sclerae anicteric. Mouth moist. Chest and CVS unremarkable.  Abdomen:  Soft, nontender. Bowel sounds present.  LABORATORY DATA:  05/11/2011: White cell count 6.3, hemoglobin 9.6, hematocrit 8.9, and platelets 230. Iron 23, TIBC 193, saturation 12%. Ferritin 1176 (04/24/2005).  B12 331.  IMPRESSION AND PLAN: 1. This is a 71 year old man who is Jehovah's Witness with iron-     deficiency anemia.  Status post IV iron informed INFeD on October 20, 2010.  His iron stores are adequate and thus he will not require     additional iron for time being.  Continue observation. 2. History of a T1c adenocarcinoma of the prostate (Gleason score 3 +     3).  Status post external radiation therapy.  Follows up with Dr.     Brunilda Payor.  Patient follows up in 9 months time.   ______________________________ Laurice Record, M.D. LIO/MEDQ  D:  05/15/2011  T:  05/15/2011  Job:  [id number]

## 2011-05-15 NOTE — Telephone Encounter (Signed)
appts made for 10/8 and 10/11 and spouse placed info in cal.   aom

## 2011-05-15 NOTE — Progress Notes (Signed)
This office note has been dictated.

## 2011-11-17 IMAGING — CR DG CHEST 2V
2 series · 2 of 2 positions shown · non-contrast
Comparison: 09/14/2010

CLINICAL DATA: Fever.

CHEST - 2 VIEW

[w chest lat]
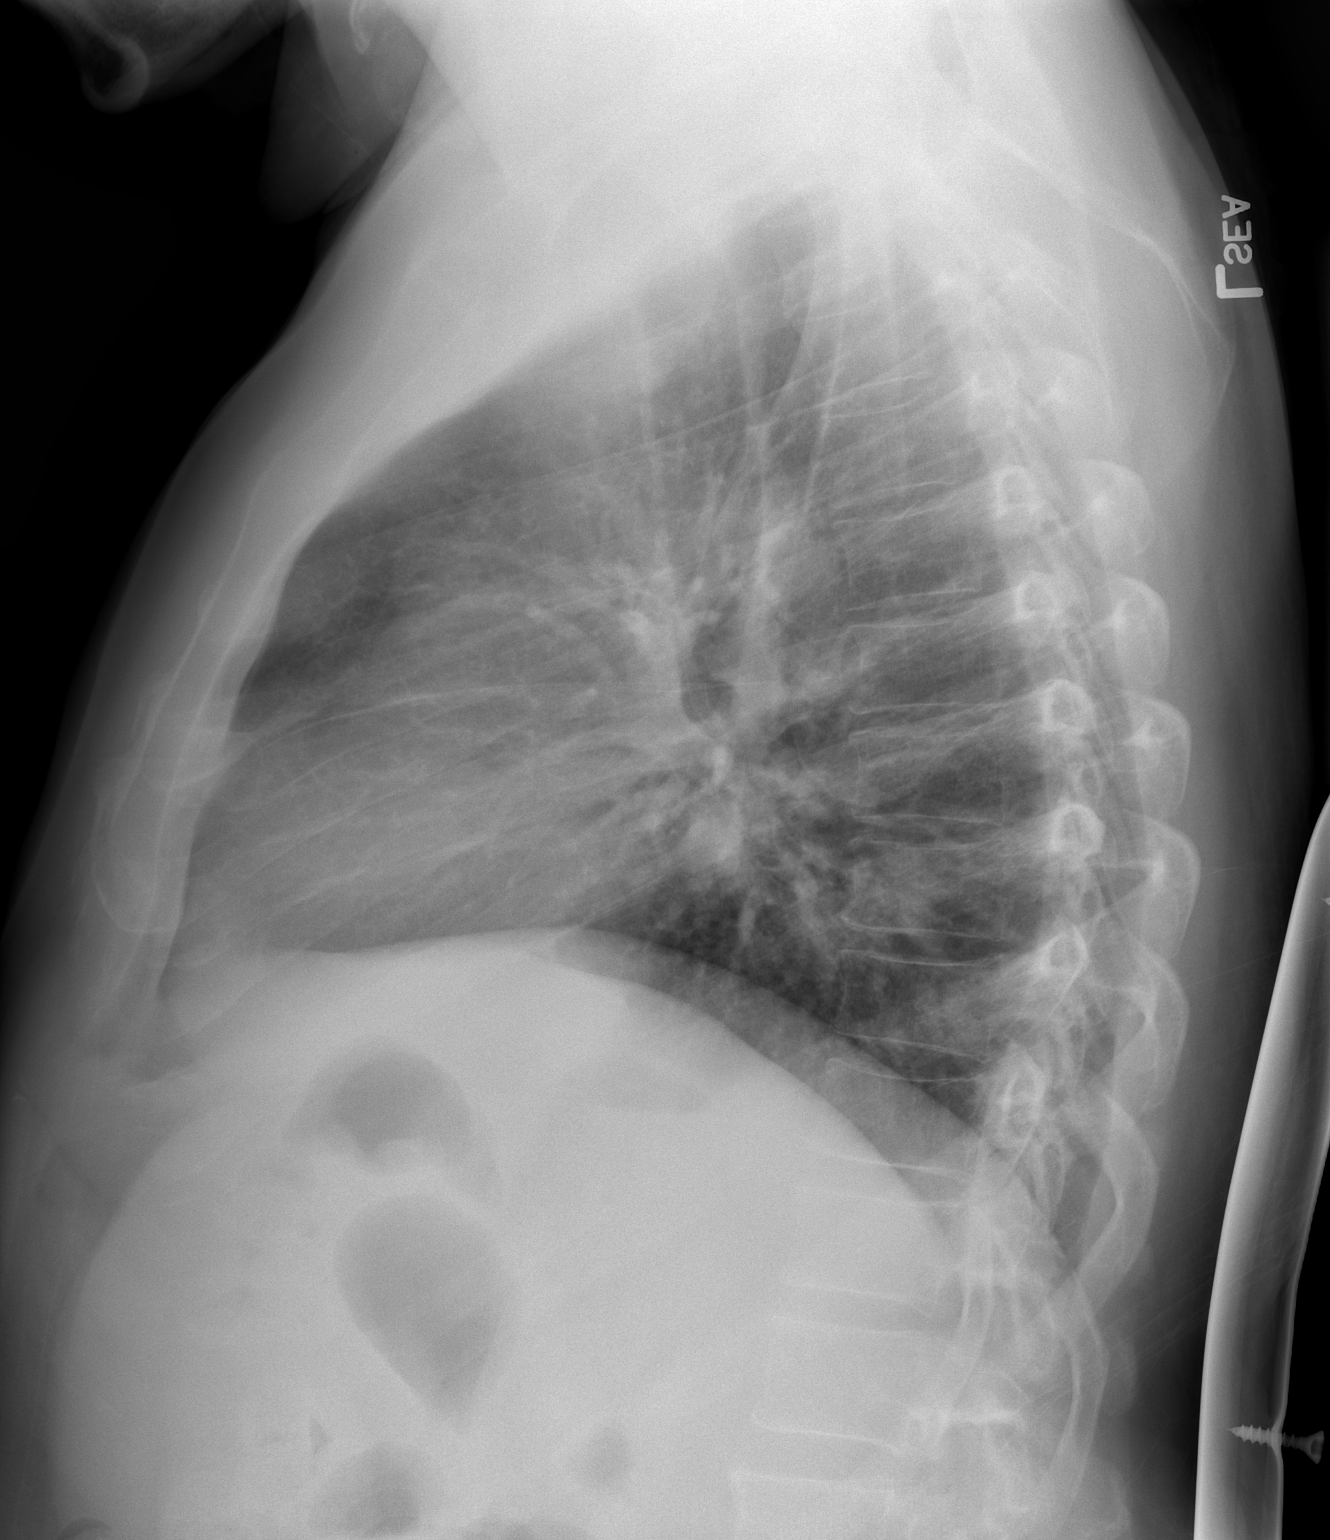

[view not recorded]
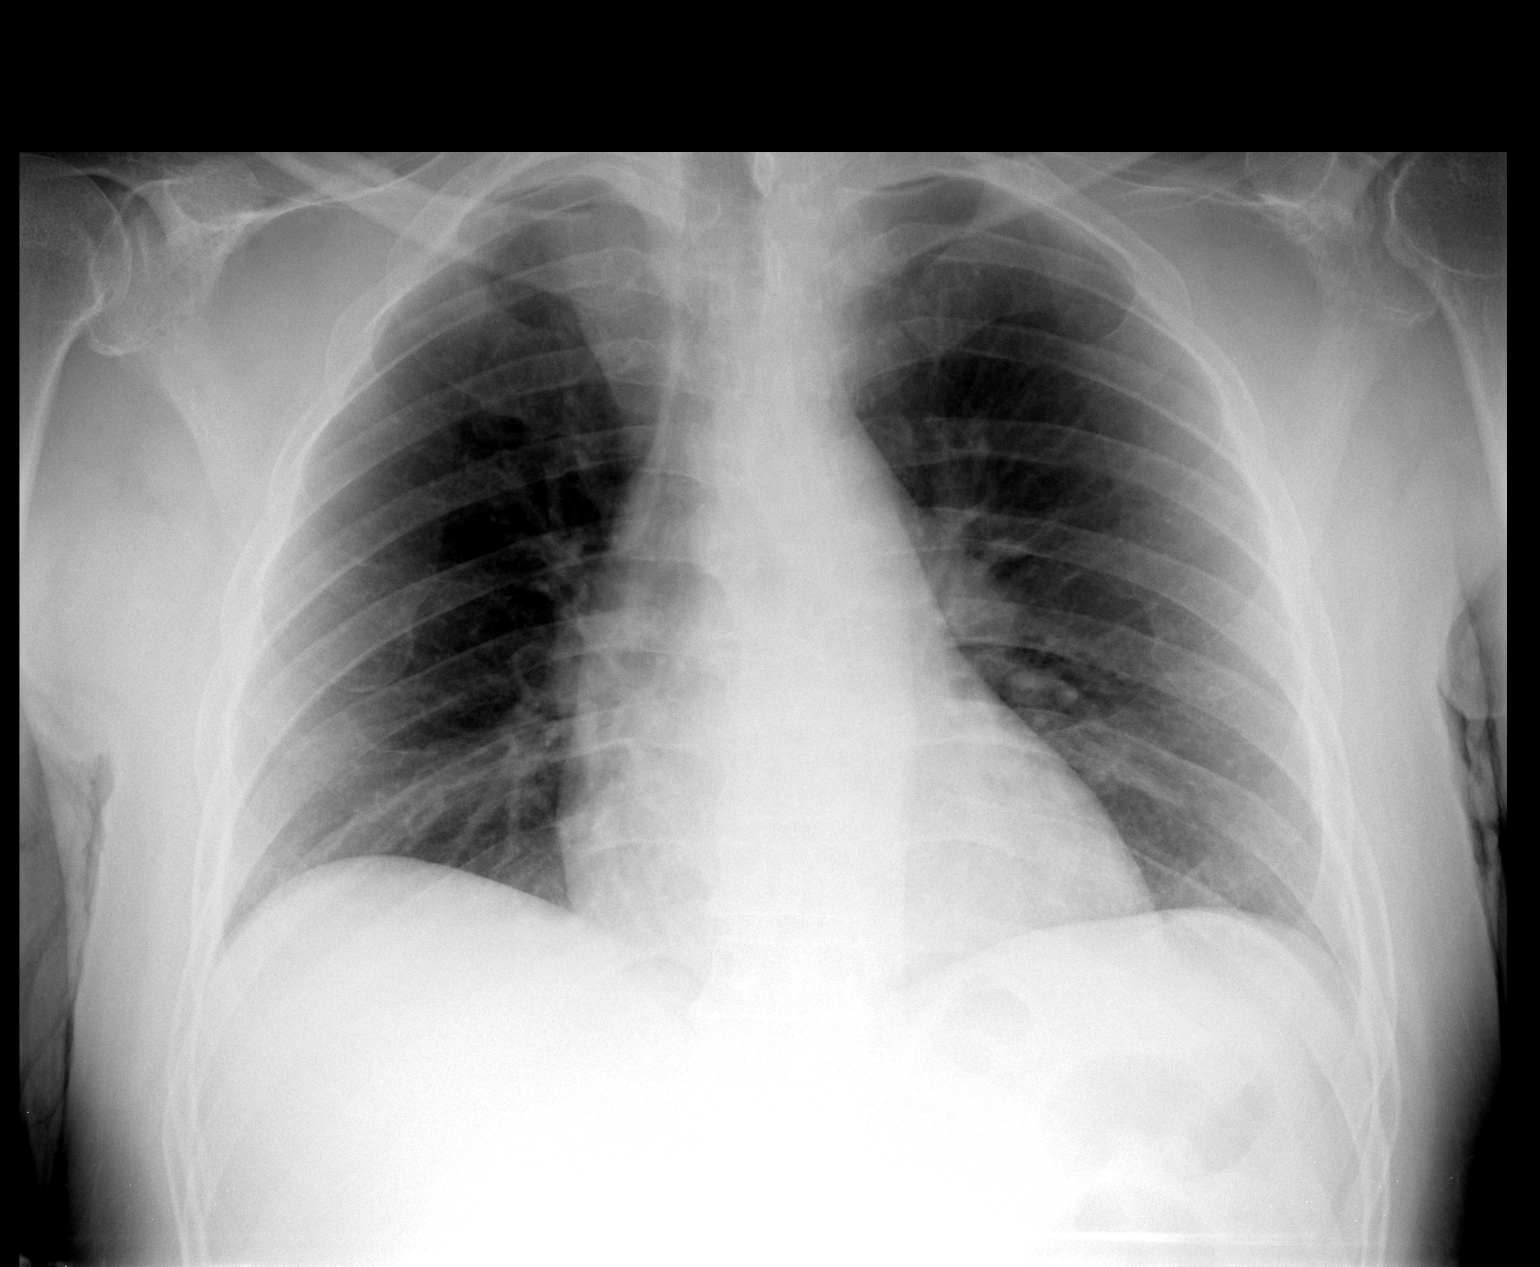

[2 of 2 positions shown; findings below may reference images not displayed]

FINDINGS: The heart, mediastinum and hila are within normal limits.
The lungs are clear.  The bony thorax is demineralized but intact.
There has been no change.
IMPRESSION: No active disease of the chest.

## 2012-01-29 ENCOUNTER — Other Ambulatory Visit (HOSPITAL_BASED_OUTPATIENT_CLINIC_OR_DEPARTMENT_OTHER): Payer: Medicare Other

## 2012-01-29 DIAGNOSIS — D539 Nutritional anemia, unspecified: Secondary | ICD-10-CM

## 2012-01-29 LAB — CBC WITH DIFFERENTIAL/PLATELET
BASO%: 0.2 % (ref 0.0–2.0)
EOS%: 1.2 % (ref 0.0–7.0)
HGB: 10.7 g/dL — ABNORMAL LOW (ref 13.0–17.1)
MCH: 25.6 pg — ABNORMAL LOW (ref 27.2–33.4)
MCHC: 33.3 g/dL (ref 32.0–36.0)
RBC: 4.18 10*6/uL — ABNORMAL LOW (ref 4.20–5.82)
RDW: 14.9 % — ABNORMAL HIGH (ref 11.0–14.6)
lymph#: 1.2 10*3/uL (ref 0.9–3.3)

## 2012-01-29 LAB — IRON AND TIBC
Iron: 61 ug/dL (ref 42–165)
UIBC: 179 ug/dL (ref 125–400)

## 2012-01-29 LAB — BASIC METABOLIC PANEL (CC13)
CO2: 18 mEq/L — ABNORMAL LOW (ref 22–29)
Calcium: 9.3 mg/dL (ref 8.4–10.4)
Chloride: 106 mEq/L (ref 98–107)
Creatinine: 1.9 mg/dL — ABNORMAL HIGH (ref 0.7–1.3)
Glucose: 302 mg/dl — ABNORMAL HIGH (ref 70–99)

## 2012-01-29 LAB — FOLATE: Folate: 19.1 ng/mL

## 2012-02-01 ENCOUNTER — Telehealth: Payer: Self-pay | Admitting: Hematology and Oncology

## 2012-02-01 ENCOUNTER — Encounter: Payer: Self-pay | Admitting: Family

## 2012-02-01 ENCOUNTER — Ambulatory Visit (HOSPITAL_BASED_OUTPATIENT_CLINIC_OR_DEPARTMENT_OTHER): Payer: Medicare Other | Admitting: Family

## 2012-02-01 VITALS — BP 175/82 | HR 88 | Temp 97.7°F | Resp 20 | Ht 72.0 in | Wt 257.2 lb

## 2012-02-01 DIAGNOSIS — D649 Anemia, unspecified: Secondary | ICD-10-CM

## 2012-02-01 DIAGNOSIS — D509 Iron deficiency anemia, unspecified: Secondary | ICD-10-CM

## 2012-02-01 NOTE — Telephone Encounter (Signed)
gve the pt his July 2014 appt calendar

## 2012-02-01 NOTE — Progress Notes (Signed)
Patient ID: Roger Wood, male   DOB: 1940/11/01, 71 y.o.   MRN: [id number] CSN: [id number]  CC: Georgianne Fick, M.D.  Lindaann Slough, M.D.  Oneita Hurt, M.D.   Identifying Statement: Roger Wood is a 71 y.o. African-American male with anemia who presents for follow up.   Interval History: Roger Wood is a 71 year-old male with anemia who presents for follow up.  Roger Wood was last seen in our office on  05/17/2011.  He is accompanied by his wife Roger Wood for today's office visit.  Since his last office visit, the patient reports that he has been doing well since he was last seen by Dr. Dalene Carrow.  He had a severe medication allergic reaction and subsequent hospitalization over a year ago which rendered him unable to ambulate.  The patient states that he started ambulating 2 weeks ago, but requires crutches to ambulate.  Prior to that, he states he was wheelchair-bound.  The patient states that he has not taken any medication but an occasional Goody's powder in the last year.  The patient has ceased taking all medications including medications for HTN, diabetes and hyperlipidemia.  I spoke to the patient about his elevated BP and blood glucose level and that he may consider speaking to his PCP Dr. Nicholos Johns about the medications.  The patient states that he had a colonoscopy 3 years ago and it may be time for him to follow-up with Dr. Brunilda Payor, Urologist for adenocarcinoma of the prostate.  Roger Wood denies any symptomatology including pain, SOB, unusual bleeding, fever, chills, N/V/D and constipation.   Please note the medication list shown below are the patient's prescribed medications, but he states he is not taking any of them.  Medications: Current Outpatient Prescriptions  Medication Sig Dispense Refill  . amLODipine (NORVASC) 10 MG tablet Take 10 mg by mouth daily.        . colchicine 0.6 MG tablet Take 0.6 mg by mouth daily.        Marland Kitchen docusate sodium (COLACE) 100 MG capsule Take 100 mg  by mouth 2 (two) times daily.        . hydrALAZINE (APRESOLINE) 50 MG tablet Take 50 mg by mouth 3 (three) times daily.        Marland Kitchen HYDROcodone-acetaminophen (NORCO) 5-325 MG per tablet Take 1 tablet by mouth every 4 (four) hours as needed.        . insulin glargine (LANTUS) 100 UNIT/ML injection Inject 10 Units into the skin at bedtime.        Marland Kitchen lisinopril (PRINIVIL,ZESTRIL) 20 MG tablet Take 20 mg by mouth daily.        Marland Kitchen lovastatin (MEVACOR) 40 MG tablet Take 40 mg by mouth at bedtime.        . metoprolol (TOPROL-XL) 50 MG 24 hr tablet Take 75 mg by mouth daily.        . peg 3350 powder (MOVIPREP) 100 G SOLR Take 1 kit (100 g total) by mouth once.  1 kit  0    Allergies: No Known Allergies  Past Medical History: Past Medical History  Diagnosis Date  . Hypertension   . Gout   . Arthritis   . Diabetes mellitus     adult onset   . History of radiation therapy 01/27/11-03/26/11    prostate  . Refusal of blood transfusions as patient is Jehovah's Witness   . Cataract     b/l catartact surgery  . Anemia     takes  iron supplements  . Type II diabetes mellitus with nephropathy   . Prostate cancer 07/14/10    dx     Family History: Family History  Problem Relation Age of Onset  . Breast cancer Sister   . Breast cancer Paternal Aunt   . Colon cancer Neg Hx   . Diabetes Mother   . Diabetes Sister   . Diabetes Paternal Aunt   . Heart disease Father   . Stroke Paternal Uncle   . Cancer Paternal Uncle     back    Social History: History  Substance Use Topics  . Smoking status: Former Smoker    Types: Cigarettes    Quit date: 04/24/1967  . Smokeless tobacco: Never Used  . Alcohol Use: No    Review of Systems: 10 point review of systems was completed and is negative.   Physical Exam: Blood pressure 175/82, pulse 88, temperature 97.7 F (36.5 C), temperature source Oral, resp. rate 20, height 6' (1.829 m), weight 257 lb 3.2 oz (116.665 kg).  General appearance: Alert,  cooperative, well nourished, no apparent distress Head: Normocephalic, without obvious abnormality, atraumatic Eyes: Conjunctivae injected, corneas cloudy, arcus senilis, PERRLA, EOMI Nose: Nares, septum and mucosa are normal, no drainage or sinus tenderness. Neck: No adenopathy, supple, symmetrical, trachea midline, thyroid not enlarged, symmetric, no tenderness Resp: Clear to auscultation bilaterally Cardio: Regular rate and rhythm, S1, S2 normal, no murmur, click, rub or gallop GI: Soft, non-tender, distended, hypoactive bowel sounds, no organomegaly Extremities: Extremities normal, limited ROM bilateral LEs, atraumatic, no cyanosis or edema Lymph nodes: Cervical, supraclavicular, and axillary nodes normal Neurologic: Grossly normal   Laboratory Data: Appointment on 01/29/2012  Component Date Value Range Status  . WBC 01/29/2012 6.5  4.0 - 10.3 10e3/uL Final  . NEUT# 01/29/2012 4.7  1.5 - 6.5 10e3/uL Final  . HGB 01/29/2012 10.7* 13.0 - 17.1 g/dL Final  . HCT 16/01/9603 32.1* 38.4 - 49.9 % Final  . Platelets 01/29/2012 221  140 - 400 10e3/uL Final  . MCV 01/29/2012 76.8* 79.3 - 98.0 fL Final  . MCH 01/29/2012 25.6* 27.2 - 33.4 pg Final  . MCHC 01/29/2012 33.3  32.0 - 36.0 g/dL Final  . RBC 54/12/8117 4.18* 4.20 - 5.82 10e6/uL Final  . RDW 01/29/2012 14.9* 11.0 - 14.6 % Final  . lymph# 01/29/2012 1.2  0.9 - 3.3 10e3/uL Final  . MONO# 01/29/2012 0.5  0.1 - 0.9 10e3/uL Final  . Eosinophils Absolute 01/29/2012 0.1  0.0 - 0.5 10e3/uL Final  . Basophils Absolute 01/29/2012 0.0  0.0 - 0.1 10e3/uL Final  . NEUT% 01/29/2012 72.5  39.0 - 75.0 % Final  . LYMPH% 01/29/2012 18.1  14.0 - 49.0 % Final  . MONO% 01/29/2012 8.0  0.0 - 14.0 % Final  . EOS% 01/29/2012 1.2  0.0 - 7.0 % Final  . BASO% 01/29/2012 0.2  0.0 - 2.0 % Final  . Folate 01/29/2012 19.1   Final    Reference Ranges        Deficient:       0.4 - 3.3 ng/mL        Indeterminate:   3.4 - 5.4 ng/mL        Normal:              > 5.4  ng/mL  . Iron 01/29/2012 61  42 - 165 ug/dL Final  . UIBC 14/78/2956 179  125 - 400 ug/dL Final  . TIBC 21/30/8657 240  215 - 435 ug/dL Final  . %  SAT 01/29/2012 25  20 - 55 % Final  . Ferritin 01/29/2012 699* 22 - 322 ng/mL Final  . Sodium 01/29/2012 135* 136 - 145 mEq/L Final  . Potassium 01/29/2012 4.8  3.5 - 5.1 mEq/L Final  . Chloride 01/29/2012 106  98 - 107 mEq/L Final  . CO2 01/29/2012 18* 22 - 29 mEq/L Final  . Glucose 01/29/2012 302* 70 - 99 mg/dl Final  . BUN 40/98/1191 29.0* 7.0 - 26.0 mg/dL Final  . Creatinine 47/82/9562 1.9* 0.7 - 1.3 mg/dL Final  . Calcium 13/11/6576 9.3  8.4 - 10.4 mg/dL Final   Impression/Plan: Roger Wood  is a 71 year old man who is Jehovah's Witness with iron-deficiency anemia. Status post IV iron informed INFeD on October 20, 2010. His iron stores are adequate and thus he will not require additional iron for time being. Continue observation.   He has a history of a T1c adenocarcinoma of the prostate (Gleason score 3 + 3).  Status post external radiation therapy.  Dr. Brunilda Payor, Urologist follows him.  The patient will follow up with [place] in 9 months' time with laboratories (CMP, CBC, Folate, Ferritin and Iron/TIBC) to be drawn in advance of the office visit.   He and his wife are encouraged to contact us in the interim if they have any questions or concerns.  The patient and his wife has also been encouraged to follow up with Dr. Nicholos Johns regarding his elevated blood pressure and blood glucose level.   Larina Bras, NP-C 02/01/2012, 2:49 PM

## 2012-02-01 NOTE — Patient Instructions (Addendum)
Please contact Dr. Nicholos Johns regarding your blood pressure, cholesterol and blood sugar.

## 2012-06-14 ENCOUNTER — Encounter: Payer: Self-pay | Admitting: Internal Medicine

## 2012-06-14 ENCOUNTER — Telehealth: Payer: Self-pay | Admitting: Internal Medicine

## 2012-06-14 NOTE — Telephone Encounter (Signed)
s.w. pt wife and advised of new Dr..Mohamed...aware and ok...sent letter and new appt schedule

## 2012-10-28 ENCOUNTER — Telehealth: Payer: Self-pay | Admitting: Internal Medicine

## 2012-10-28 NOTE — Telephone Encounter (Signed)
pt called and needed to r/s appt....Done °

## 2012-10-29 ENCOUNTER — Other Ambulatory Visit: Payer: Medicare Other | Admitting: Lab

## 2012-10-30 ENCOUNTER — Ambulatory Visit: Payer: Medicare Other | Admitting: Internal Medicine

## 2012-10-31 ENCOUNTER — Ambulatory Visit: Payer: Medicare Other | Admitting: Hematology and Oncology

## 2012-11-11 ENCOUNTER — Other Ambulatory Visit: Payer: Medicare Other | Admitting: Lab

## 2012-11-11 ENCOUNTER — Telehealth: Payer: Self-pay | Admitting: Internal Medicine

## 2012-11-11 NOTE — Telephone Encounter (Signed)
s.w. pt wife and r/s appt due to pt having gout.Marland KitchenMarland KitchenDone

## 2012-11-12 ENCOUNTER — Ambulatory Visit: Payer: Medicare Other | Admitting: Internal Medicine

## 2012-11-12 ENCOUNTER — Other Ambulatory Visit: Payer: Medicare Other

## 2012-11-13 ENCOUNTER — Ambulatory Visit: Payer: Medicare Other | Admitting: Internal Medicine

## 2012-11-21 ENCOUNTER — Other Ambulatory Visit (HOSPITAL_BASED_OUTPATIENT_CLINIC_OR_DEPARTMENT_OTHER): Payer: Medicare Other | Admitting: Lab

## 2012-11-21 DIAGNOSIS — D649 Anemia, unspecified: Secondary | ICD-10-CM

## 2012-11-21 LAB — CBC WITH DIFFERENTIAL/PLATELET
BASO%: 0.6 % (ref 0.0–2.0)
Basophils Absolute: 0.1 10*3/uL (ref 0.0–0.1)
EOS%: 1.9 % (ref 0.0–7.0)
LYMPH%: 11.6 % — ABNORMAL LOW (ref 14.0–49.0)
MONO#: 0.6 10*3/uL (ref 0.1–0.9)
MONO%: 7.3 % (ref 0.0–14.0)
NEUT#: 6.7 10*3/uL — ABNORMAL HIGH (ref 1.5–6.5)
NEUT%: 78.6 % — ABNORMAL HIGH (ref 39.0–75.0)
RBC: 4.01 10*6/uL — ABNORMAL LOW (ref 4.20–5.82)
RDW: 17.1 % — ABNORMAL HIGH (ref 11.0–14.6)
WBC: 8.5 10*3/uL (ref 4.0–10.3)

## 2012-11-21 LAB — FOLATE: Folate: 15.2 ng/mL

## 2012-11-21 LAB — COMPREHENSIVE METABOLIC PANEL (CC13)
ALT: 12 U/L (ref 0–55)
AST: 9 U/L (ref 5–34)
BUN: 41.1 mg/dL — ABNORMAL HIGH (ref 7.0–26.0)
Calcium: 9.3 mg/dL (ref 8.4–10.4)
Chloride: 105 mEq/L (ref 98–109)
Creatinine: 2.4 mg/dL — ABNORMAL HIGH (ref 0.7–1.3)
Total Bilirubin: 0.28 mg/dL (ref 0.20–1.20)

## 2012-11-21 LAB — IRON AND TIBC CHCC
Iron: 25 ug/dL — ABNORMAL LOW (ref 42–163)
TIBC: 218 ug/dL (ref 202–409)
UIBC: 193 ug/dL (ref 117–376)

## 2012-11-24 ENCOUNTER — Ambulatory Visit (HOSPITAL_BASED_OUTPATIENT_CLINIC_OR_DEPARTMENT_OTHER): Payer: Medicare Other | Admitting: Internal Medicine

## 2012-11-24 ENCOUNTER — Telehealth: Payer: Self-pay | Admitting: Internal Medicine

## 2012-11-24 ENCOUNTER — Encounter: Payer: Self-pay | Admitting: Internal Medicine

## 2012-11-24 VITALS — BP 170/83 | HR 79 | Temp 96.9°F | Resp 20 | Ht 72.0 in | Wt 245.7 lb

## 2012-11-24 DIAGNOSIS — D509 Iron deficiency anemia, unspecified: Secondary | ICD-10-CM

## 2012-11-24 DIAGNOSIS — C61 Malignant neoplasm of prostate: Secondary | ICD-10-CM

## 2012-11-24 DIAGNOSIS — D649 Anemia, unspecified: Secondary | ICD-10-CM

## 2012-11-24 NOTE — Telephone Encounter (Signed)
gv and printed appt sched and avs for pt...MB added tx.   °

## 2012-11-24 NOTE — Patient Instructions (Signed)
I will arrange for him to receive Feraheme infusion. Followup visit in 2 months with repeat CBC and iron study.

## 2012-11-24 NOTE — Progress Notes (Signed)
[facility] [phone]   [phone]  OFFICE PROGRESS NOTE  Georgianne Fick, MD [address] [place] [address]  DIAGNOSIS: 1) Iron deficiency anemia. 2) stage TIc  Adenocarcinoma of the prostate (Gleason score 3+3) status post external beam radiation and currently under the care of Dr. Brunilda Payor.  PRIOR THERAPY: Feraheme Infusion last dose was given 10/20/2010  CURRENT THERAPY: None.  INTERVAL HISTORY: Roger Wood 72 y.o. male returns to the clinic today for followup visit accompanied by his wife. He is a former patient of Dr. Dalene Carrow and he is here today to establish care with me after she left the practice. The patient is feeling fine today with no specific complaints except for mild fatigue. He denied having any significant weight loss or night sweats. He has no chest pain, shortness breath, cough or hemoptysis. The patient had repeat CBC, iron study and ferritin performed recently and he is here for evaluation and discussion of his lab results.   MEDICAL HISTORY: Past Medical History  Diagnosis Date  . Hypertension   . Gout   . Arthritis   . Diabetes mellitus     adult onset   . History of radiation therapy 01/27/11-03/26/11    prostate  . Refusal of blood transfusions as patient is Jehovah's Witness   . Cataract     b/l catartact surgery  . Anemia     takes iron supplements  . Type II diabetes mellitus with nephropathy   . Prostate cancer 07/14/10    dx    ALLERGIES:  has No Known Allergies.  MEDICATIONS:  No current outpatient prescriptions on file.   No current facility-administered medications for this visit.    REVIEW OF SYSTEMS:  A comprehensive review of systems was negative except for: Constitutional: positive for fatigue   PHYSICAL EXAMINATION: General appearance: alert, cooperative, fatigued and no distress Head: Normocephalic, without obvious abnormality, atraumatic Neck: no adenopathy Lymph  nodes: Cervical, supraclavicular, and axillary nodes normal. Resp: clear to auscultation bilaterally Cardio: regular rate and rhythm, S1, S2 normal, no murmur, click, rub or gallop GI: soft, non-tender; bowel sounds normal; no masses,  no organomegaly Extremities: extremities normal, atraumatic, no cyanosis or edema Neurologic: Alert and oriented X 3, normal strength and tone. Normal symmetric reflexes. Normal coordination and gait  ECOG PERFORMANCE STATUS: 2 - Symptomatic, <50% confined to bed  Blood pressure 170/83, pulse 79, temperature 96.9 F (36.1 C), temperature source Oral, resp. rate 20, height 6' (1.829 m), weight 245 lb 11.2 oz (111.449 kg).  LABORATORY DATA: Lab Results  Component Value Date   WBC 8.5 11/21/2012   HGB 9.8* 11/21/2012   HCT 30.4* 11/21/2012   MCV 75.7* 11/21/2012   PLT 356 11/21/2012      Chemistry      Component Value Date/Time   NA 134* 11/21/2012 1127   NA 136 05/11/2011 1557   K 5.0 11/21/2012 1127   K 3.6 05/11/2011 1557   CL 106 01/29/2012 1520   CL 102 05/11/2011 1557   CO2 18* 11/21/2012 1127   CO2 24 05/11/2011 1557   BUN 41.1* 11/21/2012 1127   BUN 23 05/11/2011 1557   CREATININE 2.4* 11/21/2012 1127   CREATININE 1.28 05/11/2011 1557      Component Value Date/Time   CALCIUM 9.3 11/21/2012 1127   CALCIUM 9.2 05/11/2011 1557   ALKPHOS 98 11/21/2012 1127   ALKPHOS 111 01/13/2011 1910   AST 9 11/21/2012 1127   AST  19 01/13/2011 1910   ALT 12 11/21/2012 1127   ALT 23 01/13/2011 1910   BILITOT 0.28 11/21/2012 1127   BILITOT 0.6 01/13/2011 1910       RADIOGRAPHIC STUDIES: No results found.  ASSESSMENT AND PLAN: This is a very pleasant 72 years old African American male with history of iron deficiency anemia plus/minus anemia of chronic disease. His iron study today showed significant iron deficiency but persistent elevation of ferritin level which is most likely reactive in nature. I have a lengthy discussion with the patient and his wife today about his condition. I  recommended for him to proceed with Feraheme infusion 510 mg x2 doses, first dose on 11/28/2012. The patient would come back for followup visit in 2 months with repeat CBC, iron study and ferritin. For the renal insufficiency, I advised the patient to get referral from his primary care physician to see a nephrologist for evaluation of his persistent elevation of serum creatinine. He was advised to call immediately if he has any concerning symptoms in the interval. The patient voices understanding of current disease status and treatment options and is in agreement with the current care plan.  All questions were answered. The patient knows to call the clinic with any problems, questions or concerns. We can certainly see the patient much sooner if necessary.  I spent 15 minutes counseling the patient face to face. The total time spent in the appointment was 25 minutes.

## 2012-11-28 ENCOUNTER — Ambulatory Visit (HOSPITAL_BASED_OUTPATIENT_CLINIC_OR_DEPARTMENT_OTHER): Payer: Medicare Other

## 2012-11-28 VITALS — BP 178/71 | HR 72 | Temp 97.8°F

## 2012-11-28 DIAGNOSIS — D509 Iron deficiency anemia, unspecified: Secondary | ICD-10-CM

## 2012-11-28 DIAGNOSIS — D649 Anemia, unspecified: Secondary | ICD-10-CM

## 2012-11-28 MED ORDER — FERUMOXYTOL INJECTION 510 MG/17 ML
510.0000 mg | Freq: Once | INTRAVENOUS | Status: AC
  Administered 2012-11-28: 510 mg via INTRAVENOUS
  Filled 2012-11-28: qty 17

## 2012-11-28 MED ORDER — SODIUM CHLORIDE 0.9 % IV SOLN: Freq: Once | INTRAVENOUS | Status: DC

## 2012-11-28 NOTE — Patient Instructions (Addendum)
Ferumoxytol injection What is this medicine? FERUMOXYTOL is an iron complex. Iron is used to make healthy red blood cells, which carry oxygen and nutrients throughout the body. This medicine is used to treat iron deficiency anemia in people with chronic kidney disease. This medicine may be used for other purposes; ask your health care provider or pharmacist if you have questions. What should I tell my health care provider before I take this medicine? They need to know if you have any of these conditions: -anemia not caused by low iron levels -high levels of iron in the blood -magnetic resonance imaging (MRI) test scheduled -an unusual or allergic reaction to iron, other medicines, foods, dyes, or preservatives -pregnant or trying to get pregnant -breast-feeding How should I use this medicine? This medicine is for infusion into a vein. It is given by a health care professional in a hospital or clinic setting. Talk to your pediatrician regarding the use of this medicine in children. Special care may be needed. Overdosage: If you think you've taken too much of this medicine contact a poison control center or emergency room at once. Overdosage: If you think you have taken too much of this medicine contact a poison control center or emergency room at once. NOTE: This medicine is only for you. Do not share this medicine with others. What if I miss a dose? It is important not to miss your dose. Call your doctor or health care professional if you are unable to keep an appointment. What may interact with this medicine? This medicine may interact with the following medications: -other iron products This list may not describe all possible interactions. Give your health care provider a list of all the medicines, herbs, non-prescription drugs, or dietary supplements you use. Also tell them if you smoke, drink alcohol, or use illegal drugs. Some items may interact with your medicine. What should I watch  for while using this medicine? Visit your doctor or healthcare professional regularly. Tell your doctor or healthcare professional if your symptoms do not start to get better or if they get worse. You may need blood work done while you are taking this medicine. You may need to follow a special diet. Talk to your doctor. Foods that contain iron include: whole grains/cereals, dried fruits, beans, or peas, leafy green vegetables, and organ meats (liver, kidney). What side effects may I notice from receiving this medicine? Side effects that you should report to your doctor or health care professional as soon as possible: -allergic reactions like skin rash, itching or hives, swelling of the face, lips, or tongue -breathing problems -changes in blood pressure -feeling faint or lightheaded, falls -fever or chills -flushing, sweating, or hot feelings -swelling of the ankles or feet Side effects that usually do not require medical attention (Report these to your doctor or health care professional if they continue or are bothersome.): -diarrhea -headache -nausea, vomiting -stomach pain This list may not describe all possible side effects. Call your doctor for medical advice about side effects. You may report side effects to FDA at 1-800-FDA-1088. Where should I keep my medicine? This drug is given in a hospital or clinic and will not be stored at home. NOTE: This sheet is a summary. It may not cover all possible information. If you have questions about this medicine, talk to your doctor, pharmacist, or health care provider.  2012, [employer] Standard. (12/31/2007 9:48:25 PM) 

## 2012-11-28 NOTE — Progress Notes (Signed)
Pt states he doesn't have high blood or diabetes but informed that BP elevated & sugar also elevated.  Encouraged to see PCP or urgent care for work=up.  He states that Dr. Arbutus Ped wants him to see a kidney specialist.

## 2012-12-05 ENCOUNTER — Ambulatory Visit (HOSPITAL_BASED_OUTPATIENT_CLINIC_OR_DEPARTMENT_OTHER): Payer: Medicare Other

## 2012-12-05 VITALS — BP 179/77 | HR 80 | Temp 97.8°F

## 2012-12-05 DIAGNOSIS — D509 Iron deficiency anemia, unspecified: Secondary | ICD-10-CM

## 2012-12-05 DIAGNOSIS — D649 Anemia, unspecified: Secondary | ICD-10-CM

## 2012-12-05 MED ORDER — SODIUM CHLORIDE 0.9 % IV SOLN
Freq: Once | INTRAVENOUS | Status: AC
  Administered 2012-12-05: 13:00:00 via INTRAVENOUS

## 2012-12-05 MED ORDER — FERUMOXYTOL INJECTION 510 MG/17 ML
510.0000 mg | Freq: Once | INTRAVENOUS | Status: AC
  Administered 2012-12-05: 510 mg via INTRAVENOUS
  Filled 2012-12-05: qty 17

## 2012-12-05 NOTE — Patient Instructions (Signed)
Ferumoxytol injection What is this medicine? FERUMOXYTOL is an iron complex. Iron is used to make healthy red blood cells, which carry oxygen and nutrients throughout the body. This medicine is used to treat iron deficiency anemia in people with chronic kidney disease. This medicine may be used for other purposes; ask your health care provider or pharmacist if you have questions. What should I tell my health care provider before I take this medicine? They need to know if you have any of these conditions: -anemia not caused by low iron levels -high levels of iron in the blood -magnetic resonance imaging (MRI) test scheduled -an unusual or allergic reaction to iron, other medicines, foods, dyes, or preservatives -pregnant or trying to get pregnant -breast-feeding How should I use this medicine? This medicine is for infusion into a vein. It is given by a health care professional in a hospital or clinic setting. Talk to your pediatrician regarding the use of this medicine in children. Special care may be needed. Overdosage: If you think you've taken too much of this medicine contact a poison control center or emergency room at once. Overdosage: If you think you have taken too much of this medicine contact a poison control center or emergency room at once. NOTE: This medicine is only for you. Do not share this medicine with others. What if I miss a dose? It is important not to miss your dose. Call your doctor or health care professional if you are unable to keep an appointment. What may interact with this medicine? This medicine may interact with the following medications: -other iron products This list may not describe all possible interactions. Give your health care provider a list of all the medicines, herbs, non-prescription drugs, or dietary supplements you use. Also tell them if you smoke, drink alcohol, or use illegal drugs. Some items may interact with your medicine. What should I watch  for while using this medicine? Visit your doctor or healthcare professional regularly. Tell your doctor or healthcare professional if your symptoms do not start to get better or if they get worse. You may need blood work done while you are taking this medicine. You may need to follow a special diet. Talk to your doctor. Foods that contain iron include: whole grains/cereals, dried fruits, beans, or peas, leafy green vegetables, and organ meats (liver, kidney). What side effects may I notice from receiving this medicine? Side effects that you should report to your doctor or health care professional as soon as possible: -allergic reactions like skin rash, itching or hives, swelling of the face, lips, or tongue -breathing problems -changes in blood pressure -feeling faint or lightheaded, falls -fever or chills -flushing, sweating, or hot feelings -swelling of the ankles or feet Side effects that usually do not require medical attention (Report these to your doctor or health care professional if they continue or are bothersome.): -diarrhea -headache -nausea, vomiting -stomach pain This list may not describe all possible side effects. Call your doctor for medical advice about side effects. You may report side effects to FDA at 1-800-FDA-1088. Where should I keep my medicine? This drug is given in a hospital or clinic and will not be stored at home. NOTE: This sheet is a summary. It may not cover all possible information. If you have questions about this medicine, talk to your doctor, pharmacist, or health care provider.  2012, [employer]. (12/31/2007 9:48:25 PM) 

## 2012-12-14 ENCOUNTER — Observation Stay (HOSPITAL_COMMUNITY)
Admission: EM | Admit: 2012-12-14 | Discharge: 2012-12-18 | Disposition: A | Discharge status: Home / Self Care | Payer: Medicare Other | Attending: Internal Medicine | Admitting: Internal Medicine

## 2012-12-14 ENCOUNTER — Encounter (HOSPITAL_COMMUNITY): Payer: Self-pay | Admitting: *Deleted

## 2012-12-14 DIAGNOSIS — R630 Anorexia: Secondary | ICD-10-CM | POA: Insufficient documentation

## 2012-12-14 DIAGNOSIS — D631 Anemia in chronic kidney disease: Secondary | ICD-10-CM | POA: Insufficient documentation

## 2012-12-14 DIAGNOSIS — E1121 Type 2 diabetes mellitus with diabetic nephropathy: Secondary | ICD-10-CM

## 2012-12-14 DIAGNOSIS — D649 Anemia, unspecified: Secondary | ICD-10-CM | POA: Diagnosis present

## 2012-12-14 DIAGNOSIS — N19 Unspecified kidney failure: Secondary | ICD-10-CM

## 2012-12-14 DIAGNOSIS — D472 Monoclonal gammopathy: Secondary | ICD-10-CM | POA: Diagnosis present

## 2012-12-14 DIAGNOSIS — E119 Type 2 diabetes mellitus without complications: Secondary | ICD-10-CM | POA: Diagnosis present

## 2012-12-14 DIAGNOSIS — D72829 Elevated white blood cell count, unspecified: Secondary | ICD-10-CM | POA: Insufficient documentation

## 2012-12-14 DIAGNOSIS — E875 Hyperkalemia: Secondary | ICD-10-CM | POA: Insufficient documentation

## 2012-12-14 DIAGNOSIS — Z531 Procedure and treatment not carried out because of patient's decision for reasons of belief and group pressure: Secondary | ICD-10-CM

## 2012-12-14 DIAGNOSIS — J811 Chronic pulmonary edema: Secondary | ICD-10-CM | POA: Insufficient documentation

## 2012-12-14 DIAGNOSIS — I129 Hypertensive chronic kidney disease with stage 1 through stage 4 chronic kidney disease, or unspecified chronic kidney disease: Secondary | ICD-10-CM | POA: Insufficient documentation

## 2012-12-14 DIAGNOSIS — N189 Chronic kidney disease, unspecified: Secondary | ICD-10-CM | POA: Diagnosis present

## 2012-12-14 DIAGNOSIS — C61 Malignant neoplasm of prostate: Secondary | ICD-10-CM

## 2012-12-14 DIAGNOSIS — M109 Gout, unspecified: Secondary | ICD-10-CM | POA: Diagnosis present

## 2012-12-14 DIAGNOSIS — R062 Wheezing: Secondary | ICD-10-CM | POA: Insufficient documentation

## 2012-12-14 DIAGNOSIS — N179 Acute kidney failure, unspecified: Principal | ICD-10-CM | POA: Diagnosis present

## 2012-12-14 DIAGNOSIS — J90 Pleural effusion, not elsewhere classified: Secondary | ICD-10-CM | POA: Insufficient documentation

## 2012-12-14 DIAGNOSIS — Z8546 Personal history of malignant neoplasm of prostate: Secondary | ICD-10-CM | POA: Insufficient documentation

## 2012-12-14 DIAGNOSIS — M25469 Effusion, unspecified knee: Secondary | ICD-10-CM | POA: Insufficient documentation

## 2012-12-14 DIAGNOSIS — E871 Hypo-osmolality and hyponatremia: Secondary | ICD-10-CM | POA: Insufficient documentation

## 2012-12-14 DIAGNOSIS — M25569 Pain in unspecified knee: Secondary | ICD-10-CM | POA: Insufficient documentation

## 2012-12-14 DIAGNOSIS — R531 Weakness: Secondary | ICD-10-CM

## 2012-12-14 DIAGNOSIS — I10 Essential (primary) hypertension: Secondary | ICD-10-CM | POA: Diagnosis present

## 2012-12-14 LAB — URINALYSIS, ROUTINE W REFLEX MICROSCOPIC
Bilirubin Urine: NEGATIVE
Ketones, ur: NEGATIVE mg/dL
Nitrite: NEGATIVE
Urobilinogen, UA: 1 mg/dL (ref 0.0–1.0)

## 2012-12-14 LAB — BASIC METABOLIC PANEL
BUN: 49 mg/dL — ABNORMAL HIGH (ref 6–23)
Creatinine, Ser: 3.06 mg/dL — ABNORMAL HIGH (ref 0.50–1.35)
GFR calc Af Amer: 22 mL/min — ABNORMAL LOW (ref >90)
GFR calc non Af Amer: 19 mL/min — ABNORMAL LOW (ref >90)
Potassium: 4.4 mEq/L (ref 3.5–5.1)

## 2012-12-14 LAB — CBC
MCHC: 33.8 g/dL (ref 30.0–36.0)
RDW: 17 % — ABNORMAL HIGH (ref 11.5–15.5)
WBC: 12 10*3/uL — ABNORMAL HIGH (ref 4.0–10.5)

## 2012-12-14 MED ORDER — OXYCODONE-ACETAMINOPHEN 5-325 MG PO TABS: 1.0000 | ORAL_TABLET | Freq: Once | ORAL | Status: DC

## 2012-12-14 MED ORDER — SODIUM CHLORIDE 0.9 % IV SOLN
1000.0000 mL | Freq: Once | INTRAVENOUS | Status: AC
  Administered 2012-12-15: 1000 mL via INTRAVENOUS

## 2012-12-14 MED ORDER — OXYCODONE-ACETAMINOPHEN 5-325 MG PO TABS
2.0000 | ORAL_TABLET | Freq: Once | ORAL | Status: AC
  Administered 2012-12-14: 2 via ORAL
  Filled 2012-12-14: qty 2

## 2012-12-14 MED ORDER — SODIUM CHLORIDE 0.9 % IV SOLN
1000.0000 mL | INTRAVENOUS | Status: DC
  Administered 2012-12-15 – 2012-12-17 (×5): 1000 mL via INTRAVENOUS

## 2012-12-14 NOTE — ED Provider Notes (Signed)
CSN: [id number]     Arrival date & time 12/14/12  2101 History     First MD Initiated Contact with Patient 12/14/12 2148     No chief complaint on file.   HPI Patient ports the emergency department with worsening generalized aches and pains all over.  States his been like this for the past 2-3 weeks.  He also reports that he did have sore throat last week but this now seems to have resolved.  No cough or congestion or shortness of breath.  No abdominal pain.  No nausea vomiting or diarrhea.  He is not a diabetic.  He denies urinary frequency.  He does not have a primary care physician.  He has a history of prostate cancer status post treatment.  He has a history of chronic anemia.  Family reports that she's been into much pain and too weak to get out of bed over the past 2 weeks.  He has been eating and drinking some while lying in bed.  Family is unable to get out of bed to the emergency department today.  No fevers or chills.  No other complaints.  He states the majority of his pain is located in his neck his back his right knee and his left shoulder.   Past Medical History  Diagnosis Date  . Hypertension   . Gout   . Arthritis   . Diabetes mellitus     adult onset   . History of radiation therapy 01/27/11-03/26/11    prostate  . Refusal of blood transfusions as patient is Jehovah's Witness   . Cataract     b/l catartact surgery  . Anemia     takes iron supplements  . Type II diabetes mellitus with nephropathy   . Prostate cancer 07/14/10    dx   History reviewed. No pertinent past surgical history. Family History  Problem Relation Age of Onset  . Breast cancer Sister   . Breast cancer Paternal Aunt   . Colon cancer Neg Hx   . Diabetes Mother   . Diabetes Sister   . Diabetes Paternal Aunt   . Heart disease Father   . Stroke Paternal Uncle   . Cancer Paternal Uncle     back   History  Substance Use Topics  . Smoking status: Former Smoker    Types: Cigarettes    Quit  date: 04/24/1967  . Smokeless tobacco: Never Used  . Alcohol Use: No    Review of Systems  All other systems reviewed and are negative.    Allergies  Review of patient's allergies indicates no known allergies.  Home Medications   Current Outpatient Rx  Name  Route  Sig  Dispense  Refill  . ibuprofen (ADVIL,MOTRIN) 200 MG tablet   Oral   Take 200 mg by mouth every 6 (six) hours as needed for pain.          BP 148/54  Pulse 90  Temp(Src) 99.3 F (37.4 C) (Oral)  Resp 18  SpO2 98% Physical Exam  Nursing note and vitals reviewed. Constitutional: He is oriented to person, place, and time. He appears well-developed and well-nourished.  HENT:  Head: Normocephalic and atraumatic.  Eyes: EOM are normal.  Neck: Normal range of motion.  Cardiovascular: Normal rate, regular rhythm, normal heart sounds and intact distal pulses.   Pulmonary/Chest: Effort normal and breath sounds normal. No respiratory distress.  Abdominal: Soft. He exhibits no distension. There is no tenderness.  Genitourinary: Rectum normal.  Musculoskeletal:  Normal range of motion.  Mild pain with range of motion of right knee with mild right knee joint effusion.  No erythema or warmth to right knee.  Neurological: He is alert and oriented to person, place, and time.  Skin: Skin is warm and dry.  Psychiatric: He has a normal mood and affect. Judgment normal.    ED Course   Procedures (including critical care time)  Labs Reviewed  CBC - Abnormal; Notable for the following:    WBC 12.0 (*)    RBC 3.65 (*)    Hemoglobin 9.1 (*)    HCT 26.9 (*)    MCV 73.7 (*)    MCH 24.9 (*)    RDW 17.0 (*)    All other components within normal limits  BASIC METABOLIC PANEL - Abnormal; Notable for the following:    Sodium 130 (*)    Glucose, Bld 208 (*)    BUN 49 (*)    Creatinine, Ser 3.06 (*)    GFR calc non Af Amer 19 (*)    GFR calc Af Amer 22 (*)    All other components within normal limits  URINALYSIS,  ROUTINE W REFLEX MICROSCOPIC - Abnormal; Notable for the following:    APPearance HAZY (*)    Hgb urine dipstick TRACE (*)    Protein, ur >300 (*)    All other components within normal limits  URINE MICROSCOPIC-ADD ON - Abnormal; Notable for the following:    Casts GRANULAR CAST (*)    All other components within normal limits  CK  HEMOGLOBIN A1C   No results found. 1. Renal failure   2. Weakness     MDM  Patient with worsening renal function as well as an elevated blood sugar of 208.  The patient is not known to be a diabetic and not on any medications.  Given his generalized weakness and worsening renal function and no primary care physician it is important that the patient be admitted the hospital for IV hydration and recheck of his renal function.  Lyanne Co, MD 12/14/12 [phone]

## 2012-12-14 NOTE — ED Notes (Signed)
Pt has history of gout, feels like he is having a flare up and has no medicine, generalized pain from it

## 2012-12-15 ENCOUNTER — Observation Stay (HOSPITAL_COMMUNITY): Payer: Medicare Other

## 2012-12-15 ENCOUNTER — Inpatient Hospital Stay (HOSPITAL_COMMUNITY): Payer: Medicare Other

## 2012-12-15 ENCOUNTER — Encounter (HOSPITAL_COMMUNITY): Payer: Self-pay | Admitting: Internal Medicine

## 2012-12-15 DIAGNOSIS — C61 Malignant neoplasm of prostate: Secondary | ICD-10-CM

## 2012-12-15 DIAGNOSIS — M109 Gout, unspecified: Secondary | ICD-10-CM | POA: Diagnosis present

## 2012-12-15 DIAGNOSIS — Z531 Procedure and treatment not carried out because of patient's decision for reasons of belief and group pressure: Secondary | ICD-10-CM

## 2012-12-15 DIAGNOSIS — N19 Unspecified kidney failure: Secondary | ICD-10-CM

## 2012-12-15 DIAGNOSIS — N179 Acute kidney failure, unspecified: Secondary | ICD-10-CM | POA: Diagnosis present

## 2012-12-15 DIAGNOSIS — D472 Monoclonal gammopathy: Secondary | ICD-10-CM

## 2012-12-15 DIAGNOSIS — E119 Type 2 diabetes mellitus without complications: Secondary | ICD-10-CM | POA: Diagnosis present

## 2012-12-15 LAB — COMPREHENSIVE METABOLIC PANEL
ALT: 63 U/L — ABNORMAL HIGH (ref 0–53)
AST: 31 U/L (ref 0–37)
Alkaline Phosphatase: 141 U/L — ABNORMAL HIGH (ref 39–117)
CO2: 21 mEq/L (ref 19–32)
GFR calc Af Amer: 21 mL/min — ABNORMAL LOW (ref >90)
Glucose, Bld: 203 mg/dL — ABNORMAL HIGH (ref 70–99)
Potassium: 4.3 mEq/L (ref 3.5–5.1)
Sodium: 131 mEq/L — ABNORMAL LOW (ref 135–145)
Total Protein: 7.1 g/dL (ref 6.0–8.3)

## 2012-12-15 LAB — GLUCOSE, CAPILLARY
Glucose-Capillary: 192 mg/dL — ABNORMAL HIGH (ref 70–99)
Glucose-Capillary: 175 mg/dL — ABNORMAL HIGH (ref 70–99)
Glucose-Capillary: 144 mg/dL — ABNORMAL HIGH (ref 70–99)

## 2012-12-15 LAB — CBC
Hemoglobin: 8.3 g/dL — ABNORMAL LOW (ref 13.0–17.0)
MCH: 24.6 pg — ABNORMAL LOW (ref 26.0–34.0)
Platelets: 279 10*3/uL (ref 150–400)
RBC: 3.38 MIL/uL — ABNORMAL LOW (ref 4.22–5.81)
WBC: 10.2 10*3/uL (ref 4.0–10.5)

## 2012-12-15 LAB — HEMOGLOBIN A1C
Hgb A1c MFr Bld: 8.9 % — ABNORMAL HIGH (ref <5.7)
Mean Plasma Glucose: 209 mg/dL — ABNORMAL HIGH (ref <117)

## 2012-12-15 LAB — CREATININE, URINE, RANDOM: Creatinine, Urine: 144.2 mg/dL

## 2012-12-15 LAB — SYNOVIAL CELL COUNT + DIFF, W/ CRYSTALS: Lymphocytes-Synovial Fld: 20 % (ref 0–20)

## 2012-12-15 LAB — SODIUM, URINE, RANDOM: Sodium, Ur: 37 mEq/L

## 2012-12-15 LAB — PHOSPHORUS: Phosphorus: 6 mg/dL — ABNORMAL HIGH (ref 2.3–4.6)

## 2012-12-15 LAB — CK: Total CK: 43 U/L (ref 7–232)

## 2012-12-15 MED ORDER — HEPARIN SODIUM (PORCINE) 5000 UNIT/ML IJ SOLN
5000.0000 [IU] | Freq: Three times a day (TID) | INTRAMUSCULAR | Status: DC
  Administered 2012-12-15 – 2012-12-18 (×10): 5000 [IU] via SUBCUTANEOUS
  Filled 2012-12-15 (×13): qty 1

## 2012-12-15 MED ORDER — AMLODIPINE BESYLATE 5 MG PO TABS
5.0000 mg | ORAL_TABLET | Freq: Every day | ORAL | Status: DC
  Administered 2012-12-15 – 2012-12-16 (×2): 5 mg via ORAL
  Filled 2012-12-15 (×3): qty 1

## 2012-12-15 MED ORDER — IOHEXOL 300 MG/ML  SOLN: 10.0000 mL | Freq: Once | INTRAMUSCULAR | Status: AC | PRN

## 2012-12-15 MED ORDER — VITAMIN B-1 100 MG PO TABS
100.0000 mg | ORAL_TABLET | Freq: Every day | ORAL | Status: DC
  Administered 2012-12-15 – 2012-12-18 (×4): 100 mg via ORAL
  Filled 2012-12-15 (×5): qty 1

## 2012-12-15 MED ORDER — ACETAMINOPHEN 650 MG RE SUPP: 650.0000 mg | Freq: Four times a day (QID) | RECTAL | Status: DC | PRN

## 2012-12-15 MED ORDER — PREDNISONE 5 MG PO TABS
25.0000 mg | ORAL_TABLET | Freq: Two times a day (BID) | ORAL | Status: DC
  Administered 2012-12-15 – 2012-12-17 (×4): 25 mg via ORAL
  Filled 2012-12-15 (×8): qty 1

## 2012-12-15 MED ORDER — INSULIN ASPART 100 UNIT/ML ~~LOC~~ SOLN
0.0000 [IU] | Freq: Every day | SUBCUTANEOUS | Status: DC
  Administered 2012-12-15: 3 [IU] via SUBCUTANEOUS
  Administered 2012-12-16: 4 [IU] via SUBCUTANEOUS
  Administered 2012-12-17: 2 [IU] via SUBCUTANEOUS

## 2012-12-15 MED ORDER — SODIUM CHLORIDE 0.9 % IV SOLN
INTRAVENOUS | Status: AC
  Administered 2012-12-15: 01:00:00 via INTRAVENOUS

## 2012-12-15 MED ORDER — ACETAMINOPHEN 325 MG PO TABS: 650.0000 mg | ORAL_TABLET | Freq: Four times a day (QID) | ORAL | Status: DC | PRN

## 2012-12-15 MED ORDER — OXYCODONE-ACETAMINOPHEN 5-325 MG PO TABS
1.0000 | ORAL_TABLET | Freq: Four times a day (QID) | ORAL | Status: DC | PRN
  Administered 2012-12-15: 1 via ORAL
  Filled 2012-12-15: qty 1

## 2012-12-15 MED ORDER — INSULIN ASPART 100 UNIT/ML ~~LOC~~ SOLN
0.0000 [IU] | Freq: Three times a day (TID) | SUBCUTANEOUS | Status: DC
  Administered 2012-12-15: 3 [IU] via SUBCUTANEOUS
  Administered 2012-12-15 (×2): 2 [IU] via SUBCUTANEOUS
  Administered 2012-12-16: 3 [IU] via SUBCUTANEOUS
  Administered 2012-12-16: 8 [IU] via SUBCUTANEOUS
  Administered 2012-12-16: 5 [IU] via SUBCUTANEOUS
  Administered 2012-12-17: 8 [IU] via SUBCUTANEOUS
  Administered 2012-12-17: 3 [IU] via SUBCUTANEOUS
  Administered 2012-12-17: 5 [IU] via SUBCUTANEOUS
  Administered 2012-12-18: 3 [IU] via SUBCUTANEOUS

## 2012-12-15 MED ORDER — VANCOMYCIN HCL 10 G IV SOLR
2000.0000 mg | Freq: Once | INTRAVENOUS | Status: AC
  Administered 2012-12-15: 2000 mg via INTRAVENOUS
  Filled 2012-12-15: qty 2000

## 2012-12-15 MED ORDER — POLYVINYL ALCOHOL 1.4 % OP SOLN
1.0000 [drp] | OPHTHALMIC | Status: DC | PRN
  Administered 2012-12-15 – 2012-12-16 (×3): 1 [drp] via OPHTHALMIC
  Filled 2012-12-15 (×2): qty 15

## 2012-12-15 MED ORDER — FOLIC ACID 1 MG PO TABS
1.0000 mg | ORAL_TABLET | Freq: Every day | ORAL | Status: DC
  Administered 2012-12-15 – 2012-12-18 (×4): 1 mg via ORAL
  Filled 2012-12-15 (×5): qty 1

## 2012-12-15 MED ORDER — THIAMINE HCL 100 MG/ML IJ SOLN
Freq: Once | INTRAVENOUS | Status: AC
  Administered 2012-12-15: 05:00:00 via INTRAVENOUS
  Filled 2012-12-15: qty 1000

## 2012-12-15 MED ORDER — VANCOMYCIN HCL 10 G IV SOLR: 1500.0000 mg | INTRAVENOUS | Status: DC

## 2012-12-15 MED ORDER — PIPERACILLIN-TAZOBACTAM 3.375 G IVPB
3.3750 g | Freq: Three times a day (TID) | INTRAVENOUS | Status: DC
  Administered 2012-12-15: 3.375 g via INTRAVENOUS
  Filled 2012-12-15 (×3): qty 50

## 2012-12-15 NOTE — Progress Notes (Signed)
ANTIBIOTIC CONSULT NOTE - INITIAL  Pharmacy Consult for Vancomycin & Zosyn Indication: possible knee infection  No Known Allergies  Patient Measurements: Height: 5\' 11"  (180.3 cm) Weight: 239 lb 3.2 oz (108.5 kg) IBW/kg (Calculated) : 75.3   Vital Signs: Temp: 97.2 F (36.2 C) (08/25 0627) Temp src: Oral (08/25 0627) BP: 175/87 mmHg (08/25 0627) Pulse Rate: 70 (08/25 0627) Intake/Output from previous day: 08/24 0701 - 08/25 0700 In: 526 [I.V.:526] Out: 100 [Urine:100] Intake/Output from this shift: Total I/O In: 788 [I.V.:788] Out: -   Labs:  Recent Labs  12/14/12 2250 12/15/12 0300 12/15/12 0400  WBC 12.0*  --  10.2  HGB 9.1*  --  8.3*  PLT 295  --  279  LABCREA  --  144.2  --   CREATININE 3.06*  --  3.17*   Estimated Creatinine Clearance: 25.6 ml/min (by C-G formula based on Cr of 3.17). No results found for this basename: VANCOTROUGH, VANCOPEAK, VANCORANDOM, GENTTROUGH, GENTPEAK, GENTRANDOM, TOBRATROUGH, TOBRAPEAK, TOBRARND, AMIKACINPEAK, AMIKACINTROU, AMIKACIN,  in the last 72 hours   Microbiology: No results found for this or any previous visit (from the past 720 hour(s)).  Medical History: Past Medical History  Diagnosis Date  . Hypertension   . Gout   . Arthritis   . Diabetes mellitus     adult onset   . History of radiation therapy 01/27/11-03/26/11    prostate  . Refusal of blood transfusions as patient is Jehovah's Witness   . Cataract     b/l catartact surgery  . Anemia     takes iron supplements  . Type II diabetes mellitus with nephropathy   . Prostate cancer 07/14/10    dx    Medications:  Scheduled:  . amLODipine  5 mg Oral Daily  . folic acid  1 mg Oral Daily  . heparin  5,000 Units Subcutaneous Q8H  . insulin aspart  0-15 Units Subcutaneous TID WC  . insulin aspart  0-5 Units Subcutaneous QHS  . predniSONE  25 mg Oral BID WC  . thiamine  100 mg Oral Daily   Infusions:  . sodium chloride     PRN: acetaminophen,  acetaminophen, iohexol, oxyCODONE-acetaminophen Anti-infectives   None     Assessment:  72 yo with PMI of DM, gout, prostate cancer, and iron deficiency anemia. He presented to the ED late in the evening 8/24-8/25/14 with complaints of generalized achiness and R knee pain/swelling. He is currently afebrile with no elevated WBC. Pharmacy consulted to dose antibiotics zosyn/vanc for r/o knee infection vs. Gout attack. Knee fluid/synovial fluid cxs drawn.   Pt also has acute renal insufficiency likely due to poor appetite/intake (pre-renal etiology). Minimal urine output: 100 cc in last 24 hrs. Scr increased to 3.17. Antibiotics will be dose adjusted.   Goal of Therapy:  Vancomycin trough level 15-20 mcg/ml  Plan:  1) Vancomycin 2000 mg IV x 1 today --> followed by 1500 mg q48 hrs starting on Wednesday 12/17/12 2) Zosyn 3.375 G IV Q8 Hours - given over 4 hr long infusion (CrCl currently > 20 ml/min) but will monitor for urine output and can adjust as needed) 3) F/U scr and monitor closely for worsening/improvement and adjust vanc/zosyn dose accordingly Measure antibiotic drug levels at steady state Follow up culture results  Janace Litten, PharmD Phone#: [phone] 12/15/2012,2:34 PM

## 2012-12-15 NOTE — H&P (Signed)
[facility] History and Physical  Patient: Roger Wood  JXB:[id number]  DOB: 10-30-1940  DOA: 12/14/2012  Referring physician: Dr. Patria Mane PCP: Georgianne Fick, MD   Chief Complaint: Generalized achiness  HPI: Roger Wood is a 72 y.o. male with Past medical history of diabetes, gout, arthritis, prostate cancer, questionable monoclonal gammopathy, iron deficiency anemia. He presented today because he has been having generalized achiness which is progressively worsening since last 2 weeks. Since last one week he has not been able to move significantly do to right knee pain and swelling. And since last to 3 days he has been mostly bedbound with pain in the right knee and also has poor appetite secondary to that. He denies any fever, chills, headache, chest pain, neck pain, palpitation, shortness of breath, abdominal pain, flank pain, nausea, vomiting, diarrhea, active bleeding, burning urination, blood in the urine, leg swelling, cough tenderness, focal neurological deficit. He does mention that he has to wake up in the middle of the night 2-3 times for urination. He also mentions that he generally drinks 3-4 beers a day 2-3 times a week. His last beer was 3 days ago. After that he has not drank because of lack of appetite.  He has not seen any physician for primary care.  Review of Systems: as mentioned in the history of present illness.  A Comprehensive review of the other systems is negative.  Past Medical History  Diagnosis Date  . Hypertension   . Gout   . Arthritis   . Diabetes mellitus     adult onset   . History of radiation therapy 01/27/11-03/26/11    prostate  . Refusal of blood transfusions as patient is Jehovah's Witness   . Cataract     b/l catartact surgery  . Anemia     takes iron supplements  . Type II diabetes mellitus with nephropathy   . Prostate cancer 07/14/10    dx   History reviewed. No pertinent past surgical history. Social History:  reports that  he quit smoking about 45 years ago. His smoking use included Cigarettes. He smoked 0.00 packs per day. He has never used smokeless tobacco. He reports that  drinks alcohol. He reports that he does not use illicit drugs. Patient is coming from home. Independent for most of his  ADL.  No Known Allergies  Family History  Problem Relation Age of Onset  . Breast cancer Sister   . Breast cancer Paternal Aunt   . Colon cancer Neg Hx   . Diabetes Mother   . Diabetes Sister   . Diabetes Paternal Aunt   . Heart disease Father   . Stroke Paternal Uncle   . Cancer Paternal Uncle     back    Prior to Admission medications   Medication Sig Start Date End Date Taking? Authorizing Provider  ibuprofen (ADVIL,MOTRIN) 200 MG tablet Take 200 mg by mouth every 6 (six) hours as needed for pain.   Yes Historical Provider, MD    Physical Exam: Filed Vitals:   12/14/12 2110 12/15/12 0013  BP: 148/54 148/70  Pulse: 90 77  Temp: 99.3 F (37.4 C) 97.8 F (36.6 C)  TempSrc: Oral Oral  Resp: 18 18  SpO2: 98% 96%    General: Alert, Awake and Oriented to Time, Place and Person. Appear in mild distress Eyes: PERRL ENT: Oral Mucosa clear moist. Neck: No  JVD, no  Carotid Bruits  Cardiovascular: S1 and S2 Present, aortic systolic Murmur, Peripheral Pulses Present Respiratory: Bilateral  Air entry equal and Decreased, Clear to Auscultation,  No  Crackles,no  wheezes Abdomen: Bowel Sound Present, Soft and Non tender  Skin: No  Rash Extremities: No  Pedal edema, no  calf tenderness, right knee swollen warm and tender on both active and passive movement  Neurologic: Grossly Unremarkable.  Labs on Admission:  CBC:  Recent Labs Lab 12/14/12 2250  WBC 12.0*  HGB 9.1*  HCT 26.9*  MCV 73.7*  PLT 295    CMP     Component Value Date/Time   NA 130* 12/14/2012 2250   NA 134* 11/21/2012 1127   K 4.4 12/14/2012 2250   K 5.0 11/21/2012 1127   CL 96 12/14/2012 2250   CL 106 01/29/2012 1520   CO2 21  12/14/2012 2250   CO2 18* 11/21/2012 1127   GLUCOSE 208* 12/14/2012 2250   GLUCOSE 261* 11/21/2012 1127   GLUCOSE 302* 01/29/2012 1520   BUN 49* 12/14/2012 2250   BUN 41.1* 11/21/2012 1127   CREATININE 3.06* 12/14/2012 2250   CREATININE 2.4* 11/21/2012 1127   CALCIUM 9.1 12/14/2012 2250   CALCIUM 9.3 11/21/2012 1127   PROT 7.6 11/21/2012 1127   PROT 7.1 01/13/2011 1910   ALBUMIN 2.7* 11/21/2012 1127   ALBUMIN 2.5* 01/13/2011 1910   AST 9 11/21/2012 1127   AST 19 01/13/2011 1910   ALT 12 11/21/2012 1127   ALT 23 01/13/2011 1910   ALKPHOS 98 11/21/2012 1127   ALKPHOS 111 01/13/2011 1910   BILITOT 0.28 11/21/2012 1127   BILITOT 0.6 01/13/2011 1910   GFRNONAA 19* 12/14/2012 2250   GFRAA 22* 12/14/2012 2250    Cardiac Enzymes:  Recent Labs Lab 12/15/12 0010  CKTOTAL 43    Radiological Exams on Admission: Dg Knee Complete 4 Views Right  12/15/2012   *RADIOLOGY REPORT*  Clinical Data: Knee pain  RIGHT KNEE - COMPLETE 4+ VIEW  Comparison: 09/14/2010  Findings: Erosive changes along the lateral femoral condyle has progressed.  No displaced fracture.  No dislocation.  Large joint effusion.  Enthesopathic changes along the patella and tibial tubercle. Tricompartmental joint space narrowing with degenerative changes.  IMPRESSION: Lateral femoral condyle erosion and large joint effusion, suspicious for a crystalline arthropathy versus infection.  Tricompartmental DJD.   Original Report Authenticated By: Jearld Lesch, M.D.     Assessment/Plan Principal Problem:   Acute renal failure Active Problems:   Anemia, unspecified   IgG monoclonal gammopathy   Hypertension   Gout attack   Diabetes   1. Acute renal failure The patient's generalized achiness and fatigue is likely secondary to worsening renal function. His renal function has progressively been declining, and in the last 1 month has got significantly worse. With increasing 0.5 of creatinine it appears he has acute kidney injury over chronic kidney  disease. The likely cause of his acute kidney injury is prerenal due to poor appetite.  Differentiated we will be obtaining urine electrolytes, and gently hydrate the patient at present. He does have history of possible IgG monoclonal gammopathy, therefore we'll also obtain kappa lambda chain ratio, also hepatitis b surface antigen and ANA will be checked. Urine output will be monitored  2.Anemia  Although the patient have Ironn deficiency anemia, possibility of multiple myeloma with anemia acute renal failure cannot be ruled out. Further workup will be done.  3.Diabetes  Patient has multiple random blood sugar elevated more than 200.  He will be getting glycohemoglobin. Patient will be placed on sliding scale   4.Gout attack Patient has  history of gout, he has more than 2 episodes in one year. He might benefit from long-term suppression therapy. At present due to his acute kidney injury both NSA and colchicine are contraindicated. Therefore we will treat him with oral prednisone, he may require orthopedic consult for aspiration of the joint in the morning if he is not getting better.   DVT Prophylaxis: subcutaneous Heparin Nutrition: Renal and diabetic diet  Code Status: Full  Family Communication: Family was at the bedside   Author: Lynden Oxford, MD [facility] Pager: [phone] 12/15/2012, 12:56 AM    If 7PM-7AM, please contact night-coverage [email or web address] Password [email or web address]

## 2012-12-15 NOTE — Care Management Note (Signed)
   CARE MANAGEMENT NOTE 12/15/2012  Patient:  Roger Wood, Roger Wood   Account Number:  [id number]  Date Initiated:  12/15/2012  Documentation initiated by:  Franke Menter  Subjective/Objective Assessment:   72 yo male admitted with acute renal faiulure. PTA pt from home. PCP: Georgianne Fick, MD.     Action/Plan:   Home when stable   Anticipated DC Date:     Anticipated DC Plan:  HOME/SELF CARE      DC Planning Services  CM consult      Choice offered to / List presented to:  NA   DME arranged  NA      DME agency  NA     HH arranged  NA      HH agency  NA   Status of service:  In process, will continue to follow Medicare Important Message given?   (If response is "NO", the following Medicare IM given date fields will be blank) Date Medicare IM given:   Date Additional Medicare IM given:    Discharge Disposition:    Per UR Regulation:  Reviewed for med. necessity/level of care/duration of stay  If discussed at Long Length of Stay Meetings, dates discussed:    Comments:  12/15/12 1327 Josey Dettmann,RN,MSN [phone] Chart reviewed for utilization of services.No barriers identified at this time.

## 2012-12-15 NOTE — Consult Note (Signed)
Reason for Consult:Right knee pain- R/O septic knee Referring Physician: Dr. Alfred Levins is an 72 y.o. male.  HPI: Mr Shreve has an approximately 2 week h/o malaise, generalized pain and weakness. For the past week he has had knee pain, initially bilateral then localizing to the right knee with increased pain and swelling. He denies fever, chills, night sweats or other systemic symptoms of infection. He has a history of gout and said the current knee pain feels like a gout flare. He was admitted by the medical team due to renal failure and noted to have a warm swollen knee. He was sent to radiology this AM for aspiration. We were consulted to rule out septic knee. He feels much better post aspiration.  Past Medical History  Diagnosis Date  . Hypertension   . Gout   . Arthritis   . Diabetes mellitus     adult onset   . History of radiation therapy 01/27/11-03/26/11    prostate  . Refusal of blood transfusions as patient is Jehov[employer]'s Witness   . Cataract     b/l catartact surgery  . Anemia     takes iron supplements  . Type II diabetes mellitus with nephropathy   . Prostate cancer 07/14/10    dx    History reviewed. No pertinent past surgical history.  Family History  Problem Relation Age of Onset  . Breast cancer Sister   . Breast cancer Paternal Aunt   . Colon cancer Neg Hx   . Diabetes Mother   . Diabetes Sister   . Diabetes Paternal Aunt   . Heart disease Father   . Stroke Paternal Uncle   . Cancer Paternal Uncle     back    Social History:  reports that he quit smoking about 45 years ago. His smoking use included Cigarettes. He smoked 0.00 packs per day. He has never used smokeless tobacco. He reports that  drinks alcohol. He reports that he does not use illicit drugs.  Allergies: No Known Allergies  Medications: I have reviewed the patient's current medications.  Results for orders placed during the hospital encounter of 12/14/12 (from the past 48 hour(s))   URINALYSIS, ROUTINE W REFLEX MICROSCOPIC     Status: Abnormal   Collection Time    12/14/12 10:36 PM      Result Value Range   Color, Urine YELLOW  YELLOW   APPearance HAZY (*) CLEAR   Specific Gravity, Urine 1.025  1.005 - 1.030   pH 5.0  5.0 - 8.0   Glucose, UA NEGATIVE  NEGATIVE mg/dL   Hgb urine dipstick TRACE (*) NEGATIVE   Bilirubin Urine NEGATIVE  NEGATIVE   Ketones, ur NEGATIVE  NEGATIVE mg/dL   Protein, ur >213 (*) NEGATIVE mg/dL   Urobilinogen, UA 1.0  0.0 - 1.0 mg/dL   Nitrite NEGATIVE  NEGATIVE   Leukocytes, UA NEGATIVE  NEGATIVE  URINE MICROSCOPIC-ADD ON     Status: Abnormal   Collection Time    12/14/12 10:36 PM      Result Value Range   Squamous Epithelial / LPF RARE  RARE   WBC, UA 0-2  <3 WBC/hpf   RBC / HPF 0-2  <3 RBC/hpf   Bacteria, UA RARE  RARE   Casts GRANULAR CAST (*) NEGATIVE  CBC     Status: Abnormal   Collection Time    12/14/12 10:50 PM      Result Value Range   WBC 12.0 (*) 4.0 - 10.5  K/uL   RBC 3.65 (*) 4.22 - 5.81 MIL/uL   Hemoglobin 9.1 (*) 13.0 - 17.0 g/dL   HCT 16.1 (*) 09.6 - 04.5 %   MCV 73.7 (*) 78.0 - 100.0 fL   MCH 24.9 (*) 26.0 - 34.0 pg   MCHC 33.8  30.0 - 36.0 g/dL   RDW 40.9 (*) 81.1 - 91.4 %   Platelets 295  150 - 400 K/uL  BASIC METABOLIC PANEL     Status: Abnormal   Collection Time    12/14/12 10:50 PM      Result Value Range   Sodium 130 (*) 135 - 145 mEq/L   Potassium 4.4  3.5 - 5.1 mEq/L   Chloride 96  96 - 112 mEq/L   CO2 21  19 - 32 mEq/L   Glucose, Bld 208 (*) 70 - 99 mg/dL   BUN 49 (*) 6 - 23 mg/dL   Creatinine, Ser 7.82 (*) 0.50 - 1.35 mg/dL   Calcium 9.1  8.4 - 95.6 mg/dL   GFR calc non Af Amer 19 (*) >90 mL/min   GFR calc Af Amer 22 (*) >90 mL/min   Comment: (NOTE)     The eGFR has been calculated using the CKD EPI equation.     This calculation has not been validated in all clinical situations.     eGFR's persistently <90 mL/min signify possible Chronic Kidney     Disease.  HEMOGLOBIN A1C     Status:  Abnormal   Collection Time    12/14/12 11:57 PM      Result Value Range   Hemoglobin A1C 8.9 (*) <5.7 %   Comment: (NOTE)                                                                               According to the ADA Clinical Practice Recommendations for 2011, when     HbA1c is used as a screening test:      >=6.5%   Diagnostic of Diabetes Mellitus               (if abnormal result is confirmed)     5.7-6.4%   Increased risk of developing Diabetes Mellitus     References:Diagnosis and Classification of Diabetes Mellitus,Diabetes     Care,2011,34(Suppl 1):S62-S69 and Standards of Medical Care in             Diabetes - 2011,Diabetes Care,2011,34 (Suppl 1):S11-S61.   Mean Plasma Glucose 209 (*) <117 mg/dL   Comment: Performed at [facility]  CK     Status: None   Collection Time    12/15/12 12:10 AM      Result Value Range   Total CK 43  7 - 232 U/L  GLUCOSE, CAPILLARY     Status: Abnormal   Collection Time    12/15/12  1:27 AM      Result Value Range   Glucose-Capillary 192 (*) 70 - 99 mg/dL  CREATININE, URINE, RANDOM     Status: None   Collection Time    12/15/12  3:00 AM      Result Value Range   Creatinine, Urine 144.2     Comment: Performed at [facility]  SODIUM, URINE, RANDOM     Status: None   Collection Time    12/15/12  3:00 AM      Result Value Range   Sodium, Ur 37     Comment: Performed at [facility]  COMPREHENSIVE METABOLIC PANEL     Status: Abnormal   Collection Time    12/15/12  4:00 AM      Result Value Range   Sodium 131 (*) 135 - 145 mEq/L   Potassium 4.3  3.5 - 5.1 mEq/L   Chloride 99  96 - 112 mEq/L   CO2 21  19 - 32 mEq/L   Glucose, Bld 203 (*) 70 - 99 mg/dL   BUN 53 (*) 6 - 23 mg/dL   Creatinine, Ser 1.61 (*) 0.50 - 1.35 mg/dL   Calcium 8.9  8.4 - 09.6 mg/dL   Total Protein 7.1  6.0 - 8.3 g/dL   Albumin 2.3 (*) 3.5 - 5.2 g/dL   AST 31  0 - 37 U/L   ALT 63 (*) 0 - 53 U/L   Alkaline Phosphatase 141 (*) 39 - 117  U/L   Total Bilirubin 0.2 (*) 0.3 - 1.2 mg/dL   GFR calc non Af Amer 18 (*) >90 mL/min   GFR calc Af Amer 21 (*) >90 mL/min   Comment: (NOTE)     The eGFR has been calculated using the CKD EPI equation.     This calculation has not been validated in all clinical situations.     eGFR's persistently <90 mL/min signify possible Chronic Kidney     Disease.  HEPATITIS B SURFACE ANTIGEN     Status: None   Collection Time    12/15/12  4:00 AM      Result Value Range   Hepatitis B Surface Ag NEGATIVE  NEGATIVE   Comment: Performed at [facility]  CBC     Status: Abnormal   Collection Time    12/15/12  4:00 AM      Result Value Range   WBC 10.2  4.0 - 10.5 K/uL   RBC 3.38 (*) 4.22 - 5.81 MIL/uL   Hemoglobin 8.3 (*) 13.0 - 17.0 g/dL   HCT 04.5 (*) 40.9 - 81.1 %   MCV 74.0 (*) 78.0 - 100.0 fL   MCH 24.6 (*) 26.0 - 34.0 pg   MCHC 33.2  30.0 - 36.0 g/dL   RDW 91.4 (*) 78.2 - 95.6 %   Platelets 279  150 - 400 K/uL  PHOSPHORUS     Status: Abnormal   Collection Time    12/15/12  4:00 AM      Result Value Range   Phosphorus 6.0 (*) 2.3 - 4.6 mg/dL  URIC ACID     Status: Abnormal   Collection Time    12/15/12  4:00 AM      Result Value Range   Uric Acid, Serum 10.3 (*) 4.0 - 7.8 mg/dL  GLUCOSE, CAPILLARY     Status: Abnormal   Collection Time    12/15/12  7:34 AM      Result Value Range   Glucose-Capillary 175 (*) 70 - 99 mg/dL  GLUCOSE, CAPILLARY     Status: Abnormal   Collection Time    12/15/12 12:33 PM      Result Value Range   Glucose-Capillary 144 (*) 70 - 99 mg/dL  CELL COUNT + DIFF,  W/ CRYST-SYNVL FLD     Status: Abnormal   Collection Time  12/15/12  1:06 PM      Result Value Range   Color, Synovial RED (*) YELLOW   Appearance-Synovial TURBID (*) CLEAR   Crystals, Fluid INTRACELLULAR MONOSODIUM URATE CRYSTALS     Comment: EXTRACELLULAR MONOSODIUM URATE CRYSTALS   WBC, Synovial SPECIMEN CLOTTED  0 - 200 /cu mm   Neutrophil, Synovial 80 (*) 0 - 25 %    Lymphocytes-Synovial Fld 20  0 - 20 %    US Renal  12/15/2012   *RADIOLOGY REPORT*  Clinical Data: Acute kidney injury  RENAL/URINARY TRACT ULTRASOUND COMPLETE  Comparison:  MRI, 09/18/2010  Findings:  Right Kidney:  Normal in size and echogenicity with no mass, stone or hydronephrosis.  The right kidney measures 13.8 cm.  Left Kidney:  Normal in size and echogenicity with no mass, stone or hydronephrosis.  The left kidney measures 11.5 cm.  Bladder:  Normal  IMPRESSION: Normal renal ultrasound.   Original Report Authenticated By: Amie Portland, M.D.   Dg Knee Complete 4 Views Right  12/15/2012   *RADIOLOGY REPORT*  Clinical Data: Knee pain  RIGHT KNEE - COMPLETE 4+ VIEW  Comparison: 09/14/2010  Findings: Erosive changes along the lateral femoral condyle has progressed.  No displaced fracture.  No dislocation.  Large joint effusion.  Enthesopathic changes along the patella and tibial tubercle. Tricompartmental joint space narrowing with degenerative changes.  IMPRESSION: Lateral femoral condyle erosion and large joint effusion, suspicious for a crystalline arthropathy versus infection.  Tricompartmental DJD.   Original Report Authenticated By: Jearld Lesch, M.D.   Dg Fluoro Guide Ndl Plc/bx  12/15/2012   CLINICAL DATA:  71 year old male with acute on chronic right knee pain. Leukocytosis. Previous diagnosis of gout. Right knee effusion, similar to a 09/14/2010 comparison. Aspiration of right knee joint effusion for laboratory analysis requested.  EXAM: RIGHT KNEE JOINT NEEDLE ASPIRATION UNDER FLUOROSCOPY.  FLUOROSCOPY TIME:  1 min and 8 seconds.  PROCEDURE: Procedure technique, risk and benefits discussed with the patient and written and verbal informed consent was obtained. A "time-out" was performed.  Palpation of the knee revealed suprapatellar swelling and firmness. Using fluoroscopy, the superior, inferior, and lateral margins of the patella were marked on the skin surface. A lateral approach for  the joint aspiration was undertaken. Overlying skin prepped with Betadine, draped in the usual sterile fashion, and infiltrated locally with Lidocaine.  An 18 gauge needle was advanced from the lateral aspect of the knee, and after several attempts was advanced in the direction of the central patella without resistance . Fluoroscopic images demonstrate needle tip position near the central patella.  Initial aspiration did not yield any fluid despite repositioning of the needle.  5-10 mL of Omnipaque 300 contrast was then injected through the needle during fluoroscopic observation pooled in the vicinity of the patella.  Following this, aspiration yielded about 10 mL serosanguineous fluid. This aspirate was discarded, but an additional 12 mL of fluid with identical serosanguineous quality was subsequently aspirated using 10 mL and 30 mL syringes. These were capped and sent to the lab for analysis. No additional fluid could be aspirated. Palpation of the knee suggested decreased suprapatellar swelling.  The needle was withdrawn and direct pressure was held. Hemostasis was noted. The patient tolerated the procedure well and without difficulty. Betadine was removed and a Band-Aid was placed over the needle entry site.  IMPRESSION: Aspiration of serosanguineous fluid from the right knee under fluoroscopy. Approximately 12 mL of fluid was sent to the lab for analysis.   Electronically  Signed   By: Augusto Gamble   On: 12/15/2012 11:59    ROS Blood pressure 183/76, pulse 67, temperature 97.3 F (36.3 C), temperature source Oral, resp. rate 18, height 5\' 11"  (1.803 m), weight 239 lb 3.2 oz (108.5 kg), SpO2 100.00%. Physical Exam Physical Examination: General appearance - alert, well appearing, and in no distress Mental status - alert, oriented to person, place, and time Left knee- No warmth or effusion; no pain on ROM Bilat hips- No tenderness; no pain on ROM Right knee- Small effusion; slight warmth; no tenderness; no  pain on ROM; no laxity; AROM- 0-120 without pain  Prelim results of aspirate- Intracellular crystals, no organisms Serum uric acid- 10.3   Assessment/Plan: Right knee pain- Appears to be gouty flare. Feels much better post-aspiration. Fluid with intracellular crystals and serum uric acid very high. All consistent with gout/pseudogout. Will follow cultures. No surgical intervention necessary at this time.  Loanne Drilling 12/15/2012, 4:09 PM

## 2012-12-15 NOTE — Progress Notes (Addendum)
[facility] PROGRESS NOTE  Roger Wood EAV:[id number] DOB: Aug 07, 1940 DOA: 12/14/2012 PCP: Georgianne Fick, MD  Assessment/Plan:  1-Acute on chronic renal failure; per records prior cr at 1.9 to 2.4. He presents with cr at 3.0. Acute on chronic renal failure could be secondary to pre renal, decrease volume due to poor oral intake, NSAID use. Also question of history of diabetes. Gout might play a role too. Will check UPEP, SPEP due to prior history of Gammopathy. Will check renal US. Continue with IV fluids.  -ANA, urine na, cr pending.   2-Right knee pain , swelling: likely secondary to gout. Will proceed with arthrocentesis rule out infectious process. Continue with prednisone. Will follow synovial fluid results. Check uric acid level. Continue with prednisone.   3-Diabetes: hyperglycemia: HB A 1c pending.  SSI.   4- Anemia of chronic disease in setting renal failure. Follow hb trend. Prior iron level at 25, ferritin 586. Will start ferrous sulfate.  5-Transaminases: hepatitis panel pending.  6-Alcohol intake: start thiamine and folate. Monitor on ciwa protocol.  7-HTN: start Norvasc.   Code Status: Full Code.  Family Communication: care discussed with wife who was at bedside.  Disposition Plan: remain inpatient.    Consultants:  none  Procedures:  Right knee arthrocentesis.   Antibiotics:  none  HPI/Subjective: Feeling well, able to urinate. No worsening right knee pain.   Objective: Filed Vitals:   12/15/12 0627  BP: 175/87  Pulse: 70  Temp: 97.2 F (36.2 C)  Resp: 18    Intake/Output Summary (Last 24 hours) at 12/15/12 1036 Last data filed at 12/15/12 0600  Gross per 24 hour  Intake    526 ml  Output    100 ml  Net    426 ml   Filed Weights   12/15/12 0105  Weight: 108.5 kg (239 lb 3.2 oz)    Exam:   General:  No distress.   Cardiovascular: S 1, S 2 RRR  Respiratory: CTA  Abdomen: BS present, soft, nt  Musculoskeletal: right knee  with swelling, warm, no redness.   Data Reviewed: Basic Metabolic Panel:  Recent Labs Lab 12/14/12 2250 12/15/12 0400  NA 130* 131*  K 4.4 4.3  CL 96 99  CO2 21 21  GLUCOSE 208* 203*  BUN 49* 53*  CREATININE 3.06* 3.17*  CALCIUM 9.1 8.9  PHOS  --  6.0*   Liver Function Tests:  Recent Labs Lab 12/15/12 0400  AST 31  ALT 63*  ALKPHOS 141*  BILITOT 0.2*  PROT 7.1  ALBUMIN 2.3*   No results found for this basename: LIPASE, AMYLASE,  in the last 168 hours No results found for this basename: AMMONIA,  in the last 168 hours CBC:  Recent Labs Lab 12/14/12 2250 12/15/12 0400  WBC 12.0* 10.2  HGB 9.1* 8.3*  HCT 26.9* 25.0*  MCV 73.7* 74.0*  PLT 295 279   Cardiac Enzymes:  Recent Labs Lab 12/15/12 0010  CKTOTAL 43   BNP (last 3 results) No results found for this basename: PROBNP,  in the last 8760 hours CBG:  Recent Labs Lab 12/15/12 0127 12/15/12 0734  GLUCAP 192* 175*    No results found for this or any previous visit (from the past 240 hour(s)).   Studies: Dg Knee Complete 4 Views Right  12/15/2012   *RADIOLOGY REPORT*  Clinical Data: Knee pain  RIGHT KNEE - COMPLETE 4+ VIEW  Comparison: 09/14/2010  Findings: Erosive changes along the lateral femoral condyle has progressed.  No displaced  fracture.  No dislocation.  Large joint effusion.  Enthesopathic changes along the patella and tibial tubercle. Tricompartmental joint space narrowing with degenerative changes.  IMPRESSION: Lateral femoral condyle erosion and large joint effusion, suspicious for a crystalline arthropathy versus infection.  Tricompartmental DJD.   Original Report Authenticated By: Jearld Lesch, M.D.    Scheduled Meds: . sodium chloride   Intravenous STAT  . heparin  5,000 Units Subcutaneous Q8H  . insulin aspart  0-15 Units Subcutaneous TID WC  . insulin aspart  0-5 Units Subcutaneous QHS  . predniSONE  25 mg Oral BID WC   Continuous Infusions: . sodium chloride       Principal Problem:   Acute renal failure Active Problems:   Anemia, unspecified   IgG monoclonal gammopathy   Hypertension   Gout attack   Diabetes    Time spent: 25 minutes.     REGALADO,BELKYS  [facility] Pager [phone]. If 7PM-7AM, please contact night-coverage at [email or web address], password [facility] 12/15/2012, 10:36 AM  LOS: 1 day

## 2012-12-16 DIAGNOSIS — M109 Gout, unspecified: Secondary | ICD-10-CM

## 2012-12-16 DIAGNOSIS — N179 Acute kidney failure, unspecified: Secondary | ICD-10-CM

## 2012-12-16 DIAGNOSIS — D649 Anemia, unspecified: Secondary | ICD-10-CM

## 2012-12-16 DIAGNOSIS — E119 Type 2 diabetes mellitus without complications: Secondary | ICD-10-CM

## 2012-12-16 LAB — BASIC METABOLIC PANEL
BUN: 54 mg/dL — ABNORMAL HIGH (ref 6–23)
CO2: 21 mEq/L (ref 19–32)
Calcium: 8.9 mg/dL (ref 8.4–10.5)
Chloride: 99 mEq/L (ref 96–112)
Creatinine, Ser: 2.46 mg/dL — ABNORMAL HIGH (ref 0.50–1.35)
GFR calc Af Amer: 24 mL/min — ABNORMAL LOW (ref >90)
GFR calc non Af Amer: 21 mL/min — ABNORMAL LOW (ref >90)
Potassium: 5.4 mEq/L — ABNORMAL HIGH (ref 3.5–5.1)
Potassium: 5 mEq/L (ref 3.5–5.1)
Sodium: 127 mEq/L — ABNORMAL LOW (ref 135–145)

## 2012-12-16 LAB — GLUCOSE, CAPILLARY
Glucose-Capillary: 187 mg/dL — ABNORMAL HIGH (ref 70–99)
Glucose-Capillary: 274 mg/dL — ABNORMAL HIGH (ref 70–99)

## 2012-12-16 LAB — CBC
MCHC: 33.5 g/dL (ref 30.0–36.0)
RDW: 17.1 % — ABNORMAL HIGH (ref 11.5–15.5)

## 2012-12-16 LAB — KAPPA/LAMBDA LIGHT CHAINS
Kappa free light chain: 11.2 mg/dL — ABNORMAL HIGH (ref 0.33–1.94)
Lambda free light chains: 6.5 mg/dL — ABNORMAL HIGH (ref 0.57–2.63)

## 2012-12-16 MED ORDER — SODIUM POLYSTYRENE SULFONATE 15 GM/60ML PO SUSP
15.0000 g | Freq: Once | ORAL | Status: AC
  Administered 2012-12-16: 15 g via ORAL
  Filled 2012-12-16: qty 60

## 2012-12-16 MED ORDER — AMLODIPINE BESYLATE 5 MG PO TABS
5.0000 mg | ORAL_TABLET | Freq: Once | ORAL | Status: AC
  Administered 2012-12-16: 5 mg via ORAL
  Filled 2012-12-16: qty 1

## 2012-12-16 MED ORDER — LIVING WELL WITH DIABETES BOOK
Freq: Once | Status: AC
  Administered 2012-12-16: 16:00:00
  Filled 2012-12-16: qty 1

## 2012-12-16 MED ORDER — INSULIN GLARGINE 100 UNIT/ML ~~LOC~~ SOLN
10.0000 [IU] | Freq: Every day | SUBCUTANEOUS | Status: DC
  Administered 2012-12-16 – 2012-12-17 (×2): 10 [IU] via SUBCUTANEOUS
  Filled 2012-12-16 (×2): qty 0.1

## 2012-12-16 MED ORDER — AMLODIPINE BESYLATE 10 MG PO TABS
10.0000 mg | ORAL_TABLET | Freq: Every day | ORAL | Status: DC
  Administered 2012-12-17 – 2012-12-18 (×2): 10 mg via ORAL
  Filled 2012-12-16 (×2): qty 1

## 2012-12-16 MED ORDER — FERROUS SULFATE 325 (65 FE) MG PO TABS
325.0000 mg | ORAL_TABLET | Freq: Two times a day (BID) | ORAL | Status: DC
  Administered 2012-12-16 – 2012-12-18 (×4): 325 mg via ORAL
  Filled 2012-12-16 (×6): qty 1

## 2012-12-16 NOTE — Progress Notes (Signed)
[facility] PROGRESS NOTE  Roger Wood MWU:[id number] DOB: [birth year] DOA: 12/14/2012 PCP: Georgianne Fick, MD  Assessment/Plan:  1-Acute on chronic renal failure; per records prior cr at 1.9 to 2.4. He presents with cr at 3.0. Acute on chronic renal failure could be secondary to pre renal, decrease volume due to poor oral intake, NSAID use. Also uncontrol  Diabetes, HTN. Gout might play a role too.  -UPEP, SPEP ordered due to prior history of Gammopathy.  -Renal US normal.  -Continue with IV fluids.  -ANA negative, urine na 37, cr 144.  Cr trending down. Today at 2.7. Urine out put 1.4 L.   2-Right knee pain , swelling: likely secondary to gout. Patient S/P arthrocentesis 8-25. Specimen clotted unable to report WBC, More than 80 % of neutrophil raise question of septic arthritis. Evaluated by ortho no need for surgical intervention. Continue with prednisone.  uric acid level at 10.3 . Continue with prednisone. Antibiotics discontinue. Pain better after arthrocentesis. Synovial fluid with no growth, gram stain negative.   3-Diabetes: hyperglycemia: HB A 1c 8.8. Will start Lantus 10 units daily.  SSI. He was not taking any medication for diabetes.   4- Anemia of chronic disease in setting renal failure. Follow hb trend. Prior iron level at 25, ferritin 586. Continue with  ferrous sulfate.   5-Transaminases: hepatitis surface B negative.  6-Alcohol intake: Continue with  thiamine and folate. Monitor on ciwa protocol.  7-HTN: started Norvasc 8-25. Will increase Norvasc to 10 mg daily.  8-Hyponatremia: continue with IV fluids.  9-Hyperkalemia: will give 15 gr Kayexalate. Repeat B-met this afternoon.   Code Status: Full Code.  Family Communication: care discussed with patient.  Disposition Plan: remain inpatient.    Consultants:  none  Procedures:  Right knee arthrocentesis.   Antibiotics:  none  HPI/Subjective: Right knee pain better. No others complaint.    Objective: Filed Vitals:   12/16/12 0608  BP: 172/66  Pulse: 78  Temp: 98 F (36.7 C)  Resp: 16    Intake/Output Summary (Last 24 hours) at 12/16/12 1215 Last data filed at 12/16/12 0900  Gross per 24 hour  Intake   1388 ml  Output   1900 ml  Net   -512 ml   Filed Weights   12/15/12 0105  Weight: 108.5 kg (239 lb 3.2 oz)    Exam:   General:  No distress.   Cardiovascular: S 1, S 2 RRR  Respiratory: CTA  Abdomen: BS present, soft, nt  Musculoskeletal: right knee with decrease swelling, warm, no redness.   Data Reviewed: Basic Metabolic Panel:  Recent Labs Lab 12/14/12 2250 12/15/12 0400 12/16/12 0350  NA 130* 131* 127*  K 4.4 4.3 5.4*  CL 96 99 97  CO2 21 21 20   GLUCOSE 208* 203* 309*  BUN 49* 53* 54*  CREATININE 3.06* 3.17* 2.76*  CALCIUM 9.1 8.9 8.9  PHOS  --  6.0*  --    Liver Function Tests:  Recent Labs Lab 12/15/12 0400  AST 31  ALT 63*  ALKPHOS 141*  BILITOT 0.2*  PROT 7.1  ALBUMIN 2.3*   No results found for this basename: LIPASE, AMYLASE,  in the last 168 hours No results found for this basename: AMMONIA,  in the last 168 hours CBC:  Recent Labs Lab 12/14/12 2250 12/15/12 0400 12/16/12 0350  WBC 12.0* 10.2 9.6  HGB 9.1* 8.3* 8.4*  HCT 26.9* 25.0* 25.1*  MCV 73.7* 74.0* 74.0*  PLT 295 279 324   Cardiac Enzymes:  Recent Labs Lab 12/15/12 0010  CKTOTAL 43   BNP (last 3 results) No results found for this basename: PROBNP,  in the last 8760 hours CBG:  Recent Labs Lab 12/15/12 0734 12/15/12 1233 12/15/12 1647 12/15/12 2226 12/16/12 0744  GLUCAP 175* 144* 133* 279* 206*    Recent Results (from the past 240 hour(s))  BODY FLUID CULTURE     Status: None   Collection Time    12/15/12 11:31 AM      Result Value Range Status   Specimen Description KNEE RIGHT   Final   Special Requests Normal   Final   Gram Stain     Final   Value: RARE WBC PRESENT, PREDOMINANTLY PMN     NO ORGANISMS SEEN     Performed at  [facility]   Culture     Final   Value: NO GROWTH 1 DAY     Performed at [facility]   Report Status PENDING   Incomplete     Studies: US Renal  12/15/2012   *RADIOLOGY REPORT*  Clinical Data: Acute kidney injury  RENAL/URINARY TRACT ULTRASOUND COMPLETE  Comparison:  MRI, 09/18/2010  Findings:  Right Kidney:  Normal in size and echogenicity with no mass, stone or hydronephrosis.  The right kidney measures 13.8 cm.  Left Kidney:  Normal in size and echogenicity with no mass, stone or hydronephrosis.  The left kidney measures 11.5 cm.  Bladder:  Normal  IMPRESSION: Normal renal ultrasound.   Original Report Authenticated By: Amie Portland, M.D.   Dg Knee Complete 4 Views Right  12/15/2012   *RADIOLOGY REPORT*  Clinical Data: Knee pain  RIGHT KNEE - COMPLETE 4+ VIEW  Comparison: 09/14/2010  Findings: Erosive changes along the lateral femoral condyle has progressed.  No displaced fracture.  No dislocation.  Large joint effusion.  Enthesopathic changes along the patella and tibial tubercle. Tricompartmental joint space narrowing with degenerative changes.  IMPRESSION: Lateral femoral condyle erosion and large joint effusion, suspicious for a crystalline arthropathy versus infection.  Tricompartmental DJD.   Original Report Authenticated By: Jearld Lesch, M.D.   Dg Fluoro Guide Ndl Plc/bx  12/15/2012   CLINICAL DATA:  72 year old male with acute on chronic right knee pain. Leukocytosis. Previous diagnosis of gout. Right knee effusion, similar to a 09/14/2010 comparison. Aspiration of right knee joint effusion for laboratory analysis requested.  EXAM: RIGHT KNEE JOINT NEEDLE ASPIRATION UNDER FLUOROSCOPY.  FLUOROSCOPY TIME:  1 min and 8 seconds.  PROCEDURE: Procedure technique, risk and benefits discussed with the patient and written and verbal informed consent was obtained. A "time-out" was performed.  Palpation of the knee revealed suprapatellar swelling and firmness. Using  fluoroscopy, the superior, inferior, and lateral margins of the patella were marked on the skin surface. A lateral approach for the joint aspiration was undertaken. Overlying skin prepped with Betadine, draped in the usual sterile fashion, and infiltrated locally with Lidocaine.  An 18 gauge needle was advanced from the lateral aspect of the knee, and after several attempts was advanced in the direction of the central patella without resistance . Fluoroscopic images demonstrate needle tip position near the central patella.  Initial aspiration did not yield any fluid despite repositioning of the needle.  5-10 mL of Omnipaque 300 contrast was then injected through the needle during fluoroscopic observation pooled in the vicinity of the patella.  Following this, aspiration yielded about 10 mL serosanguineous fluid. This aspirate was discarded, but an additional 12 mL of fluid with identical  serosanguineous quality was subsequently aspirated using 10 mL and 30 mL syringes. These were capped and sent to the lab for analysis. No additional fluid could be aspirated. Palpation of the knee suggested decreased suprapatellar swelling.  The needle was withdrawn and direct pressure was held. Hemostasis was noted. The patient tolerated the procedure well and without difficulty. Betadine was removed and a Band-Aid was placed over the needle entry site.  IMPRESSION: Aspiration of serosanguineous fluid from the right knee under fluoroscopy. Approximately 12 mL of fluid was sent to the lab for analysis.   Electronically Signed   By: Augusto Gamble   On: 12/15/2012 11:59    Scheduled Meds: . amLODipine  5 mg Oral Daily  . folic acid  1 mg Oral Daily  . heparin  5,000 Units Subcutaneous Q8H  . insulin aspart  0-15 Units Subcutaneous TID WC  . insulin aspart  0-5 Units Subcutaneous QHS  . insulin glargine  10 Units Subcutaneous QHS  . predniSONE  25 mg Oral BID WC  . thiamine  100 mg Oral Daily   Continuous Infusions: . sodium  chloride 1,000 mL (12/16/12 1003)    Principal Problem:   Acute renal failure Active Problems:   Anemia, unspecified   IgG monoclonal gammopathy   Hypertension   Gout attack   Diabetes    Time spent: 25 minutes.     Madison Direnzo  [facility] Pager [phone]. If 7PM-7AM, please contact night-coverage at [email or web address], password [facility] 12/16/2012, 12:15 PM  LOS: 2 days

## 2012-12-16 NOTE — Evaluation (Signed)
Physical Therapy Evaluation Patient Details Name: Roger Wood MRN: [id number] DOB: 1940-09-10 Today's Date: 12/16/2012 Time: 1191-4782 PT Time Calculation (min): 18 min  PT Assessment / Plan / Recommendation History of Present Illness  Roger Wood is a 72 y.o. male with Past medical history of diabetes, gout, arthritis, prostate cancer, questionable monoclonal gammopathy, iron deficiency anemia. Pt underwent aspiration of right knee 8/25  Clinical Impression  Pt has very atypical gait pattern that he attributes to pain from gout.  He usually walks with crutches, but I recommend a RW for safety and stability. He will need to work on ROM of legs and posture in gait.     PT Assessment  Patient needs continued PT services    Follow Up Recommendations  Home health PT    Does the patient have the potential to tolerate intense rehabilitation      Barriers to Discharge        Equipment Recommendations  Rolling walker with 5" wheels    Recommendations for Other Services     Frequency Min 3X/week    Precautions / Restrictions Precautions Precautions: Fall   Pertinent Vitals/Pain Pt c/o pain in left ankle while walking      Mobility  Bed Mobility Details for Bed Mobility Assistance: pt sitting up on edge of bed and states he got up by himself Transfers Transfers: Sit to Stand;Stand to Sit Sit to Stand: 5: Supervision Stand to Sit: 5: Supervision Details for Transfer Assistance: cues to push up with arms Ambulation/Gait Ambulation/Gait Assistance: 4: Min assist Ambulation Distance (Feet): 25 Feet Assistive device: Rolling walker Ambulation/Gait Assistance Details: safety Gait Pattern: Step-to pattern;Decreased step length - right;Decreased step length - left;Trunk flexed;Left flexed knee in stance Gait velocity: tends to walk quickly General Gait Details: Pt walks bent over at the waist with left knee bent and left ankle plantarflexed.  He says he is walking this way  because of "gout" and when he doesn't have gout he walks fine with good posture  Some dyspnea noted on exertion, but O2 sats 97% on room air with ambulation Stairs: No Wheelchair Mobility Wheelchair Mobility: No    Exercises     PT Diagnosis: Difficulty walking;Abnormality of gait;Acute pain  PT Problem List: Decreased strength;Decreased range of motion;Decreased mobility;Decreased safety awareness;Decreased knowledge of use of DME;Pain PT Treatment Interventions: Gait training;Stair training;Functional mobility training;Therapeutic exercise     PT Goals(Current goals can be found in the care plan section) Acute Rehab PT Goals Patient Stated Goal: to go home PT Goal Formulation: With patient Time For Goal Achievement: 12/30/12 Potential to Achieve Goals: Good  Visit Information  Last PT Received On: 12/16/12 Assistance Needed: +2 History of Present Illness: Roger Wood is a 72 y.o. male with Past medical history of diabetes, gout, arthritis, prostate cancer, questionable monoclonal gammopathy, iron deficiency anemia. Pt underwent aspiration of right knee 8/25       Prior Functioning  Home Living Family/patient expects to be discharged to:: Private residence Living Arrangements: Spouse/significant other Available Help at Discharge: Family Type of Home: House Home Access: Stairs to enter Secretary/administrator of Steps: 4 Home Layout: One level Home Equipment: Crutches Prior Function Level of Independence: Independent with assistive device(s) Communication Communication: No difficulties    Cognition  Cognition Arousal/Alertness: Awake/alert Overall Cognitive Status: Within Functional Limits for tasks assessed    Extremity/Trunk Assessment Lower Extremity Assessment Lower Extremity Assessment: RLE deficits/detail;LLE deficits/detail RLE Deficits / Details: pt with no warmness or redness, reports right knee  is feeling much better since the aspiration. Able to flex and  extend knee LLE Deficits / Details: pt reports pain in left ankle   Cervical / Trunk Assessment Cervical / Trunk Assessment: Other exceptions Cervical / Trunk Exceptions: obesity   Balance Balance Balance Assessed: Yes Static Sitting Balance Static Sitting - Balance Support: No upper extremity supported Static Sitting - Level of Assistance: 7: Independent Static Standing Balance Static Standing - Balance Support: Bilateral upper extremity supported Static Standing - Level of Assistance: 6: Modified independent (Device/Increase time)  End of Session PT - End of Session Activity Tolerance: Patient tolerated treatment well Patient left: in chair;with family/visitor present  GP    Rosey Bath K. [place], [place] [phone] 12/16/2012, 4:56 PM

## 2012-12-16 NOTE — Progress Notes (Signed)
On-call notified of elevated BP, new orders received.

## 2012-12-16 NOTE — Progress Notes (Addendum)
Inpatient Diabetes Program Recommendations  AACE/ADA: New Consensus Statement on Inpatient Glycemic Control (2013)  Target Ranges:  Prepandial:   less than 140 mg/dL      Peak postprandial:   less than 180 mg/dL (1-2 hours)      Critically ill patients:  140 - 180 mg/dL   Reason for Visit: Hyperglycemia and Elevated HgbA1C  Results for EXAVIOR, KIMMONS (MRN [id number]) as of 12/16/2012 09:22  Ref. Range 12/15/2012 01:27 12/15/2012 07:34 12/15/2012 12:33 12/15/2012 16:47 12/15/2012 22:26 12/16/2012 07:44  Glucose-Capillary Latest Range: 70-99 mg/dL 409 (H) 811 (H) 914 (H) 133 (H) 279 (H) 206 (H)    Inpatient Diabetes Program Recommendations Insulin - Basal: Add Lantus 12 units QHS Insulin - Meal Coverage: Add Novolog 3 units tidwc if pt eats >50% meal Outpatient Referral: OP Diabetes Education for uncontrolled DM  Note: Will continue to follow.  Thank you. Ailene Ards, RD, LDN, CDE Inpatient Diabetes Coordinator [phone]  Addendum:  Pt denies ever having DM.  Tried to explain disease process, HgbA1C results, importance of keeping logbook of blood sugars and f/u with PCP.  Pt states he needs PCP and agreeable to OP Diabetes Education consult.  Has meter at home to check blood sugars. If pt is to go home on insulin, please order insulin starter kit and RN to begin teaching. Encouraged pt to view diabetes videos on pt ed channel. Will f/u for any questions on 8/27 am.

## 2012-12-17 ENCOUNTER — Inpatient Hospital Stay (HOSPITAL_COMMUNITY): Payer: Medicare Other

## 2012-12-17 DIAGNOSIS — I10 Essential (primary) hypertension: Secondary | ICD-10-CM

## 2012-12-17 LAB — BASIC METABOLIC PANEL
CO2: 19 mEq/L (ref 19–32)
Calcium: 8.7 mg/dL (ref 8.4–10.5)
Calcium: 9.1 mg/dL (ref 8.4–10.5)
Chloride: 101 mEq/L (ref 96–112)
GFR calc non Af Amer: 30 mL/min — ABNORMAL LOW (ref >90)
Glucose, Bld: 297 mg/dL — ABNORMAL HIGH (ref 70–99)
Sodium: 130 mEq/L — ABNORMAL LOW (ref 135–145)
Sodium: 129 mEq/L — ABNORMAL LOW (ref 135–145)

## 2012-12-17 LAB — PROTEIN ELECTROPHORESIS, SERUM
Beta 2: 7.2 % — ABNORMAL HIGH (ref 3.2–6.5)
Gamma Globulin: 17 % (ref 11.1–18.8)
M-Spike, %: 0.21 g/dL

## 2012-12-17 LAB — GLUCOSE, CAPILLARY
Glucose-Capillary: 249 mg/dL — ABNORMAL HIGH (ref 70–99)
Glucose-Capillary: 254 mg/dL — ABNORMAL HIGH (ref 70–99)
Glucose-Capillary: 201 mg/dL — ABNORMAL HIGH (ref 70–99)

## 2012-12-17 LAB — CBC
Platelets: 364 10*3/uL (ref 150–400)
RBC: 3.58 MIL/uL — ABNORMAL LOW (ref 4.22–5.81)
WBC: 10.8 10*3/uL — ABNORMAL HIGH (ref 4.0–10.5)

## 2012-12-17 LAB — PRO B NATRIURETIC PEPTIDE: Pro B Natriuretic peptide (BNP): 2740 pg/mL — ABNORMAL HIGH (ref 0–125)

## 2012-12-17 MED ORDER — FERROUS SULFATE 325 (65 FE) MG PO TABS: 325.0000 mg | ORAL_TABLET | Freq: Two times a day (BID) | ORAL | Status: DC

## 2012-12-17 MED ORDER — PREDNISONE 20 MG PO TABS
40.0000 mg | ORAL_TABLET | Freq: Every day | ORAL | Status: DC
  Administered 2012-12-18: 40 mg via ORAL
  Filled 2012-12-17 (×2): qty 2

## 2012-12-17 MED ORDER — BD GETTING STARTED TAKE HOME KIT: 1/2ML X 30G SYRINGES
1.0000 | Freq: Once | Status: DC
  Filled 2012-12-17: qty 1

## 2012-12-17 MED ORDER — INSULIN GLARGINE 100 UNIT/ML ~~LOC~~ SOLN: 10.0000 [IU] | Freq: Every day | SUBCUTANEOUS | Status: DC

## 2012-12-17 MED ORDER — ALBUTEROL SULFATE HFA 108 (90 BASE) MCG/ACT IN AERS
2.0000 | INHALATION_SPRAY | Freq: Four times a day (QID) | RESPIRATORY_TRACT | Status: DC
  Filled 2012-12-17: qty 6.7

## 2012-12-17 MED ORDER — AMLODIPINE BESYLATE 10 MG PO TABS: 10.0000 mg | ORAL_TABLET | Freq: Every day | ORAL | Status: DC

## 2012-12-17 MED ORDER — PREDNISONE 10 MG PO TABS: ORAL_TABLET | ORAL | Status: DC

## 2012-12-17 MED ORDER — THIAMINE HCL 100 MG PO TABS: 100.0000 mg | ORAL_TABLET | Freq: Every day | ORAL | Status: DC

## 2012-12-17 MED ORDER — ALBUTEROL SULFATE HFA 108 (90 BASE) MCG/ACT IN AERS: 2.0000 | INHALATION_SPRAY | Freq: Four times a day (QID) | RESPIRATORY_TRACT | Status: DC

## 2012-12-17 MED ORDER — ALBUTEROL SULFATE HFA 108 (90 BASE) MCG/ACT IN AERS: 2.0000 | INHALATION_SPRAY | RESPIRATORY_TRACT | Status: DC | PRN

## 2012-12-17 MED ORDER — BD GETTING STARTED TAKE HOME KIT: 3/10ML X 30G SYRINGES
1.0000 | Freq: Once | Status: AC
  Administered 2012-12-17: 1
  Filled 2012-12-17: qty 1

## 2012-12-17 MED ORDER — FOLIC ACID 1 MG PO TABS: 1.0000 mg | ORAL_TABLET | Freq: Every day | ORAL | Status: DC

## 2012-12-17 MED ORDER — HYDRALAZINE HCL 20 MG/ML IJ SOLN
5.0000 mg | Freq: Once | INTRAMUSCULAR | Status: AC
  Administered 2012-12-17: 5 mg via INTRAVENOUS
  Filled 2012-12-17: qty 0.25

## 2012-12-17 MED ORDER — OXYCODONE-ACETAMINOPHEN 5-325 MG PO TABS: 1.0000 | ORAL_TABLET | Freq: Four times a day (QID) | ORAL | Status: DC | PRN

## 2012-12-17 MED ORDER — FUROSEMIDE 10 MG/ML IJ SOLN
60.0000 mg | Freq: Once | INTRAMUSCULAR | Status: AC
  Administered 2012-12-17: 60 mg via INTRAVENOUS
  Filled 2012-12-17: qty 6

## 2012-12-17 MED ORDER — SODIUM POLYSTYRENE SULFONATE 15 GM/60ML PO SUSP
30.0000 g | ORAL | Status: AC
  Administered 2012-12-17: 30 g via ORAL
  Filled 2012-12-17 (×2): qty 120

## 2012-12-17 NOTE — Progress Notes (Signed)
Subjective: [name] states his right knee feels much better   Objective: Vital signs in last 24 hours: Temp:  [97.5 F (36.4 C)-97.7 F (36.5 C)] 97.7 F (36.5 C) (08/27 1400) Pulse Rate:  [76-84] 84 (08/27 1400) Resp:  [18-19] 18 (08/27 1400) BP: (172-190)/(67-82) 180/82 mmHg (08/27 1400) SpO2:  [95 %-100 %] 98 % (08/27 1400)  Intake/Output from previous day: 08/26 0701 - 08/27 0700 In: 2510 [P.O.:960; I.V.:1550] Out: 2575 [Urine:2575] Intake/Output this shift: Total I/O In: 480 [P.O.:480] Out: 600 [Urine:600]   Recent Labs  12/14/12 2250 12/15/12 0400 12/16/12 0350 12/17/12 0338  HGB 9.1* 8.3* 8.4* 8.7*    Recent Labs  12/16/12 0350 12/17/12 0338  WBC 9.6 10.8*  RBC 3.39* 3.58*  HCT 25.1* 26.5*  PLT 324 364    Recent Labs  12/17/12 0338 12/17/12 1438  NA 130* 129*  K 5.2* 4.8  CL 101 98  CO2 19 20  BUN 53* 51*  CREATININE 2.23* 2.07*  GLUCOSE 314* 297*  CALCIUM 8.7 9.1   No results found for this basename: LABPT, INR,  in the last 72 hours  Right knee- no effusion or warmth. No tenderness. No pain on ROM  Aspirate from right knee shows no growth at 2 days  Assessment/Plan: Right knee pain- Consistent with resolved gouty flare. No infection. Will sign off   Roger Wood V 12/17/2012, 5:24 PM

## 2012-12-17 NOTE — Progress Notes (Signed)
Inpatient Diabetes Program Recommendations  AACE/ADA: New Consensus Statement on Inpatient Glycemic Control (2013)  Target Ranges:  Prepandial:   less than 140 mg/dL      Peak postprandial:   less than 180 mg/dL (1-2 hours)      Critically ill patients:  140 - 180 mg/dL   Reason for Visit: Hyperglycemia  Results for Roger, Wood (MRN [id number]) as of 12/17/2012 09:25  Ref. Range 12/16/2012 07:44 12/16/2012 11:57 12/16/2012 17:23 12/17/2012 07:58  Glucose-Capillary Latest Range: 70-99 mg/dL 213 (H) 086 (H) 578 (H) 249 (H)  Results for Roger, Wood (MRN [id number]) as of 12/17/2012 09:25  Ref. Range 12/17/2012 03:38  Sodium Latest Range: 136-145 mEq/L 130 (L)  Potassium Latest Range: 3.5-5.1 mEq/L 5.2 (H)  Chloride Latest Range: 96-112 mEq/L 101  CO2 Latest Range: 19-32 mEq/L 19  BUN Latest Range: 7.0-26.0 mg/dL 53 (H)  Creatinine Latest Range: 0.50-1.35 mg/dL 4.13 (H)  Calcium Latest Range: 8.4-10.5 mg/dL 8.7  GFR calc non Af Amer Latest Range: >90 mL/min 27 (L)  GFR calc Af Amer Latest Range: >90 mL/min 32 (L)  Glucose Latest Range: 70-99 mg/dl 244 (H)   Blood sugars continue to run >200 mg/dL. Will need basal insulin at home, along with OHAs.  Will order insulin starter kit and RN to begin teaching insulin administration. Also needs PCP to manage DM.  Will f/u later today.  Thank you. Ailene Ards, RD, LDN, CDE Inpatient Diabetes Coordinator [phone]

## 2012-12-17 NOTE — Progress Notes (Signed)
Physical Therapy Treatment Patient Details Name: Roger Wood MRN: [id number] DOB: 21-Jun-1940 Today's Date: 12/17/2012 Time: 6213-0865 PT Time Calculation (min): 10 min  PT Assessment / Plan / Recommendation  History of Present Illness Roger Wood is a 72 y.o. male with Past medical history of diabetes, gout, arthritis, prostate cancer, questionable monoclonal gammopathy, iron deficiency anemia. Pt underwent aspiration of right knee 8/25   PT Comments   Pt making great progress toward goals today with stair education completed.  Follow Up Recommendations  Home health PT     Equipment Recommendations  Rolling walker with 5" wheels       Frequency Min 3X/week   Progress towards PT Goals Progress towards PT goals: Progressing toward goals  Plan Current plan remains appropriate    Precautions / Restrictions Precautions Precautions: Fall Restrictions Weight Bearing Restrictions: No       Mobility  Bed Mobility Bed Mobility: Supine to Sit Supine to Sit: 5: Supervision;HOB flat Transfers Sit to Stand: 5: Supervision;From bed;With upper extremity assist Stand to Sit: 5: Supervision;To bed;With upper extremity assist Details for Transfer Assistance: no cues or assistance needed Ambulation/Gait Ambulation/Gait Assistance: 5: Supervision Ambulation Distance (Feet): 80 Feet Assistive device: Rolling walker Ambulation/Gait Assistance Details: cues for walker position with gait and posture Gait Pattern: Step-through pattern;Decreased stride length;Trunk flexed;Antalgic Gait velocity: cues to slow down with gait for safety Stairs: Yes Stairs Assistance: 4: Min guard Stair Management Technique: One rail Right;Step to pattern;Forwards Number of Stairs: 3      PT Goals (current goals can now be found in the care plan section) Acute Rehab PT Goals Patient Stated Goal: to go home PT Goal Formulation: With patient Time For Goal Achievement: 12/30/12 Potential to Achieve Goals:  Good  Visit Information  Last PT Received On: 12/17/12 Assistance Needed: +1 History of Present Illness: Roger Wood is a 72 y.o. male with Past medical history of diabetes, gout, arthritis, prostate cancer, questionable monoclonal gammopathy, iron deficiency anemia. Pt underwent aspiration of right knee 8/25    Subjective Data  Patient Stated Goal: to go home   Cognition  Cognition Arousal/Alertness: Awake/alert Behavior During Therapy: WFL for tasks assessed/performed Overall Cognitive Status: Within Functional Limits for tasks assessed    Balance     End of Session PT - End of Session Equipment Utilized During Treatment: Gait belt Activity Tolerance: Patient tolerated treatment well Patient left: in bed;with family/visitor present;with call bell/phone within reach (seated EOB)   GP     Sallyanne Kuster 12/17/2012, 4:34 PM  Sallyanne Kuster, PTA Office- [phone]

## 2012-12-17 NOTE — Progress Notes (Signed)
[facility]  [facility] is providing the following services: RW  If patient discharges after hours, please call [phone].   Renard Hamper 12/17/2012, 11:41 AM

## 2012-12-17 NOTE — Care Management Note (Addendum)
Cm spoke with patient at the bedside with spouse present. Pt recommends HHPT. Pt offered choice. Per pt choice [facility] to provide [facility] services. Pt request [facility]. [facility] liasons Kristen Hayworth and [place] notified of pt's needs. Pt uses [facility] in [place]. Pt and spouse provided with information concerning PCP providers in [place]. Roxy Manns Salinda Snedeker,RN,MSN [phone]

## 2012-12-17 NOTE — Progress Notes (Signed)
[facility] PROGRESS NOTE  AZAM GERVASI XBM:[id number] DOB: March 05, 1941 DOA: 12/14/2012  PCP: Used to see Dr. Nicholos Johns but not anymore  Brief HPI: Roger Wood is a 72 y.o. male with Past medical history of diabetes, gout, arthritis, prostate cancer, questionable monoclonal gammopathy, iron deficiency anemia. He presented because he has been having generalized achiness which is progressively worsening since last 2 weeks. Since last one week he has not been able to move his right knee due to pain and swelling. He denied any fever, chills, headache, chest pain, neck pain, palpitation, shortness of breath, abdominal pain, flank pain, nausea, vomiting, diarrhea, active bleeding, burning urination, blood in the urine, leg swelling, cough tenderness, focal neurological deficit. He was admitted and underwent arthrocentesis of right knee. Gout was diagnosed. He also has new DM. His BP is elevated as well.  Past medical history:  Past Medical History  Diagnosis Date  . Hypertension   . Gout   . Arthritis   . Diabetes mellitus     adult onset   . History of radiation therapy 01/27/11-03/26/11    prostate  . Refusal of blood transfusions as patient is Jehovah's Witness   . Cataract     b/l catartact surgery  . Anemia     takes iron supplements  . Type II diabetes mellitus with nephropathy   . Prostate cancer 07/14/10    dx    Consultants: Ortho  Procedures: Arthrocentesis  Antibiotics: Received Vanc and Zosyn initially. Now off.  Subjective: Patient is feeling better. Pain is better in right knee. Able to walk with walker. Has some wheezing which he attributes to drinking cold water. No history of smoking. No asthma.  Objective: Vital Signs  Filed Vitals:   12/16/12 2127 12/17/12 0504 12/17/12 0520 12/17/12 0525  BP: 182/78 190/67 172/70 176/72  Pulse: 76 82    Temp: 97.5 F (36.4 C) 97.7 F (36.5 C)    TempSrc: Oral Oral    Resp: 19 18    Height:      Weight:       SpO2: 100% 95%      Intake/Output Summary (Last 24 hours) at 12/17/12 1102 Last data filed at 12/17/12 0300  Gross per 24 hour  Intake   2150 ml  Output   2075 ml  Net     75 ml   Filed Weights   12/15/12 0105  Weight: 108.5 kg (239 lb 3.2 oz)    General appearance: alert, cooperative, appears stated age and no distress Resp: Bilateral end exp wheezing bilaterally. No crackles. Cardio: regular rate and rhythm, S1, S2 normal, no murmur, click, rub or gallop GI: soft, non-tender; bowel sounds normal; no masses,  no organomegaly Extremities: extremities normal, atraumatic, no cyanosis or edema Pulses: 2+ and symmetric Skin: Skin color, texture, turgor normal. No rashes or lesions Neurologic: Alert and oriented x 3. No focal deficits.  Lab Results:  Basic Metabolic Panel:  Recent Labs Lab 12/14/12 2250 12/15/12 0400 12/16/12 0350 12/16/12 1605 12/17/12 0338  NA 130* 131* 127* 131* 130*  K 4.4 4.3 5.4* 5.0 5.2*  CL 96 99 97 99 101  CO2 21 21 20 21 19   GLUCOSE 208* 203* 309* 250* 314*  BUN 49* 53* 54* 56* 53*  CREATININE 3.06* 3.17* 2.76* 2.46* 2.23*  CALCIUM 9.1 8.9 8.9 9.3 8.7  PHOS  --  6.0*  --   --   --    Liver Function Tests:  Recent Labs Lab 12/15/12 0400  AST 31  ALT 63*  ALKPHOS 141*  BILITOT 0.2*  PROT 7.1  ALBUMIN 2.3*   CBC:  Recent Labs Lab 12/14/12 2250 12/15/12 0400 12/16/12 0350 12/17/12 0338  WBC 12.0* 10.2 9.6 10.8*  HGB 9.1* 8.3* 8.4* 8.7*  HCT 26.9* 25.0* 25.1* 26.5*  MCV 73.7* 74.0* 74.0* 74.0*  PLT 295 279 324 364   Cardiac Enzymes:  Recent Labs Lab 12/15/12 0010  CKTOTAL 43   CBG:  Recent Labs Lab 12/15/12 2226 12/16/12 0744 12/16/12 1157 12/16/12 1723 12/17/12 0758  GLUCAP 279* 206* 187* 274* 249*    Recent Results (from the past 240 hour(s))  BODY FLUID CULTURE     Status: None   Collection Time    12/15/12 11:31 AM      Result Value Range Status   Specimen Description KNEE RIGHT   Final   Special  Requests Normal   Final   Gram Stain     Final   Value: RARE WBC PRESENT, PREDOMINANTLY PMN     NO ORGANISMS SEEN     Performed at [facility]   Culture     Final   Value: NO GROWTH 1 DAY     Performed at [facility]   Report Status PENDING   Incomplete      Studies/Results: US Renal  12/15/2012   *RADIOLOGY REPORT*  Clinical Data: Acute kidney injury  RENAL/URINARY TRACT ULTRASOUND COMPLETE  Comparison:  MRI, 09/18/2010  Findings:  Right Kidney:  Normal in size and echogenicity with no mass, stone or hydronephrosis.  The right kidney measures 13.8 cm.  Left Kidney:  Normal in size and echogenicity with no mass, stone or hydronephrosis.  The left kidney measures 11.5 cm.  Bladder:  Normal  IMPRESSION: Normal renal ultrasound.   Original Report Authenticated By: Amie Portland, M.D.    Medications:  Scheduled: . albuterol  2 puff Inhalation QID  . amLODipine  10 mg Oral Daily  . bd getting started take home kit  1 kit Other Once  . ferrous sulfate  325 mg Oral BID WC  . folic acid  1 mg Oral Daily  . heparin  5,000 Units Subcutaneous Q8H  . insulin aspart  0-15 Units Subcutaneous TID WC  . insulin aspart  0-5 Units Subcutaneous QHS  . insulin glargine  10 Units Subcutaneous QHS  . [START ON 12/18/2012] predniSONE  40 mg Oral Q breakfast  . sodium polystyrene  30 g Oral STAT  . thiamine  100 mg Oral Daily   Continuous:  ZOX:[id number], acetaminophen, oxyCODONE-acetaminophen, polyvinyl alcohol  Assessment/Plan:  Principal Problem:   Acute renal failure Active Problems:   Anemia, unspecified   IgG monoclonal gammopathy   Hypertension   Gout attack   Diabetes    Acute on chronic renal failure  Per records baseline Creat is about 1.9 to 2.4. His renal function has improved with IV hydration. Reason for CKD is unknown but DM and HTN are playing a role. SPEP does suggest monoclonal gammopathy. He needs to follow up with PCP and needs to see a nephrologist as  well. He is already followed by Hematology. Acute on chronic renal failure could be secondary to pre renal azotemia, decrease volume due to poor oral intake, NSAID use. Renal US normal. Stop IVF now.  Wheezing Obtain CXR. No cough. Albuterol Inh.  Acute Gout with Right knee pain Patient S/P arthrocentesis 8-25. On Prednisone. No NSAID due to CKD. Will need Allopurinol eventually. Pain better after arthrocentesis.  Synovial fluid with no growth, gram stain negative.   DM2  Not previously diagnosed. A1c 8.8. High CBg likely also due to steroids. Continue Lantus 10 units daily. SSI. He was not taking any medication for diabetes.   History of Iron Deficiency Anemia Stable. Is followed by hematology. Iron infusion administered earlier this month. Continue with ferrous sulfate.   Transaminitis  Mild elevation in ALT at admission. Can follow as OP.   History of Alcohol intake Continue with thiamine and folate. No withdrawal.  Hypertension BP poorly controlled. Norvasc started recently and dose increased yesterday. This will take many days to improve. Will need to follow up with PCP.   Hyponatremia Improved. Etiology unclear. Will need OP follow up  Hyperkalemia Probably related to CKD. Not on any potassium. Will give kayexalate and recheck later today. Will need repeat labs as OP.  Code Status:  Full Code DVT Prophylaxis: Heparin Family Communication: Discussed with patient and spouse  Disposition Plan: Possible discharge in AM. Case manager to give PCP list to patient.    LOS: 3 days   [facility]  [facility] Pager [phone] 12/17/2012, 11:02 AM  If 8PM-8AM, please contact night-coverage at [email or web address], password [email or web address]

## 2012-12-18 DIAGNOSIS — N189 Chronic kidney disease, unspecified: Secondary | ICD-10-CM | POA: Diagnosis present

## 2012-12-18 LAB — BASIC METABOLIC PANEL
BUN: 52 mg/dL — ABNORMAL HIGH (ref 6–23)
Chloride: 102 mEq/L (ref 96–112)
GFR calc Af Amer: 37 mL/min — ABNORMAL LOW (ref >90)
GFR calc non Af Amer: 32 mL/min — ABNORMAL LOW (ref >90)
Potassium: 3.4 mEq/L — ABNORMAL LOW (ref 3.5–5.1)
Sodium: 135 mEq/L (ref 135–145)

## 2012-12-18 LAB — GLUCOSE, CAPILLARY: Glucose-Capillary: 169 mg/dL — ABNORMAL HIGH (ref 70–99)

## 2012-12-18 LAB — BODY FLUID CULTURE: Special Requests: NORMAL

## 2012-12-18 MED ORDER — FUROSEMIDE 40 MG PO TABS: ORAL_TABLET | ORAL | Status: DC

## 2012-12-18 MED ORDER — POTASSIUM CHLORIDE ER 10 MEQ PO TBCR: EXTENDED_RELEASE_TABLET | ORAL | Status: DC

## 2012-12-18 NOTE — Discharge Summary (Signed)
[facility]  Physician Discharge Summary   Patient ID: Roger Wood MRN: [id number] DOB/AGE: 74/22/1942 74 y.o.  Admit date: 12/14/2012 Discharge date: 12/18/2012  PCP: Patient has been provided a list of PCP's in [place]  DISCHARGE DIAGNOSES:  Principal Problem:   Acute renal failure Active Problems:   Anemia, unspecified   IgG monoclonal gammopathy   Hypertension   Gout attack   Diabetes   CKD (chronic kidney disease)   RECOMMENDATIONS FOR OUTPATIENT FOLLOW UP: 1. Needs close appointment with a PCP. Patient strongly encouraged to make appointment within next 7-10 days.  2. Will eventually need referral to Nephrology for CKD 3. Will need follow up with Dr. Arbutus Ped for abnormal SPEP. 4. Will need OP ECHO 5. Will need OP blood work. 6. Will likely need additional antihypertensives.  DISCHARGE CONDITION: fair  Diet recommendation: Mod Carb  Filed Weights   12/15/12 0105  Weight: 108.5 kg (239 lb 3.2 oz)    INITIAL HISTORY: Roger Wood is a 72 y.o. male with Past medical history of diabetes, gout, arthritis, prostate cancer, questionable monoclonal gammopathy, iron deficiency anemia. He presented because he has been having generalized achiness which is progressively worsening since last 2 weeks. Since last one week he has not been able to move his right knee due to pain and swelling. He denied any fever, chills, headache, chest pain, neck pain, palpitation, shortness of breath, abdominal pain, flank pain, nausea, vomiting, diarrhea, active bleeding, burning urination, blood in the urine, leg swelling, cough tenderness, focal neurological deficit. He was admitted and underwent arthrocentesis of right knee. Gout was diagnosed. He also has new DM. His BP is elevated as well.  Consultations:  Ortho  Procedures:  Arthrocentesis  HOSPITAL COURSE:   Acute on chronic renal failure  Per records baseline Creat is about 1.9 to 2.4. His renal function improved with  IV hydration. Reason for CKD is unknown but DM and HTN are playing a role. SPEP does suggest monoclonal gammopathy. He needs to follow up with Dr. Arbutus Ped to discuss these new findings. Acute on chronic renal failure could be secondary to pre renal azotemia, decrease volume due to poor oral intake, NSAID use. Renal US normal. IVF were stopped as he developed mild pulmonary edema.   Mild Pulmonary edema This was likely from excessive IVF. He was given Lasix with improvement. He will be asked to take Lasix for few more days. He will eventually need an ECHO. He has been told to seek attention if he develops shortness of breath.  Acute Gout with Right knee pain  Patient S/P arthrocentesis 8-25. He has improved with steroids. No NSAID due to CKD. Will need Allopurinol eventually but wait for acute flare to subside. This has been explained to the patient. Pain better after arthrocentesis. Synovial fluid with no growth, gram stain negative.   Newly Diagnosed DM2  Not previously diagnosed. A1c 8.8. High CBG likely also due to steroids. Continue Lantus 10 units daily. He was not taking any medication for diabetes. HH RN to check compliance and CBG's. Patient has been resistant to education. And his compliance will remains questionable. His wife seems to understand more.   History of Iron Deficiency Anemia  Stable. Is followed by hematology. Iron infusion administered earlier this month. Continue with ferrous sulfate.   Transaminitis  Mild elevation in ALT at admission. Can follow as OP.   History of Alcohol intake  Continue with thiamine and folate. No withdrawal.   Hypertension  BP poorly controlled. Norvasc  started recently and dose increased 8/26. This will take many days to improve. Will need to follow up with PCP. May need additional agents.  Hyponatremia  Improved.  Hyperkalemia  Improved. Low today likely due to Lasix. Labs can been checked at next appointment with PCP.  Overall stable. He  is keen on going home. Denies any shortness of breath. Wheezing has resolved. No chest pain.   PERTINENT LABS:  The results of significant diagnostics from this hospitalization (including imaging, microbiology, ancillary and laboratory) are listed below for reference.    Microbiology: Recent Results (from the past 240 hour(s))  BODY FLUID CULTURE     Status: None   Collection Time    12/15/12 11:31 AM      Result Value Range Status   Specimen Description KNEE RIGHT   Final   Special Requests Normal   Final   Gram Stain     Final   Value: RARE WBC PRESENT, PREDOMINANTLY PMN     NO ORGANISMS SEEN     Performed at [facility]   Culture     Final   Value: NO GROWTH 2 DAYS     Performed at [facility]   Report Status PENDING   Incomplete     Labs: Basic Metabolic Panel:  Recent Labs Lab 12/14/12 2250 12/15/12 0400 12/16/12 0350 12/16/12 1605 12/17/12 0338 12/17/12 1438 12/18/12 0333  NA 130* 131* 127* 131* 130* 129* 135  K 4.4 4.3 5.4* 5.0 5.2* 4.8 3.4*  CL 96 99 97 99 101 98 102  CO2 21 21 20 21 19 20 21   GLUCOSE 208* 203* 309* 250* 314* 297* 284*  BUN 49* 53* 54* 56* 53* 51* 52*  CREATININE 3.06* 3.17* 2.76* 2.46* 2.23* 2.07* 1.96*  CALCIUM 9.1 8.9 8.9 9.3 8.7 9.1 8.7  PHOS  --  6.0*  --   --   --   --   --    Liver Function Tests:  Recent Labs Lab 12/15/12 0400  AST 31  ALT 63*  ALKPHOS 141*  BILITOT 0.2*  PROT 7.1  ALBUMIN 2.3*   CBC:  Recent Labs Lab 12/14/12 2250 12/15/12 0400 12/16/12 0350 12/17/12 0338  WBC 12.0* 10.2 9.6 10.8*  HGB 9.1* 8.3* 8.4* 8.7*  HCT 26.9* 25.0* 25.1* 26.5*  MCV 73.7* 74.0* 74.0* 74.0*  PLT 295 279 324 364   Cardiac Enzymes:  Recent Labs Lab 12/15/12 0010  CKTOTAL 43   BNP: BNP (last 3 results)  Recent Labs  12/17/12 1438  PROBNP 2740.0*   CBG:  Recent Labs Lab 12/17/12 0758 12/17/12 1151 12/17/12 1645 12/17/12 2114 12/18/12 0728  GLUCAP 249* 180* 254* 201* 169*      IMAGING STUDIES Dg Chest 2 View  12/17/2012   *RADIOLOGY REPORT*  Clinical Data: Wheezing.  CHEST - 2 VIEW  Comparison: Chest x-ray 01/13/2011.  Findings: There is cephalization of the pulmonary vasculature and slight indistinctness of the interstitial markings suggestive of mild pulmonary edema.  Small bilateral pleural effusions. Borderline cardiomegaly.  Upper mediastinal contours are within normal limits.  IMPRESSION: 1.  Findings, as above, concerning for mild congestive heart failure.   Original Report Authenticated By: Trudie Reed, M.D.   US Renal  12/15/2012   *RADIOLOGY REPORT*  Clinical Data: Acute kidney injury  RENAL/URINARY TRACT ULTRASOUND COMPLETE  Comparison:  MRI, 09/18/2010  Findings:  Right Kidney:  Normal in size and echogenicity with no mass, stone or hydronephrosis.  The right kidney measures 13.8 cm.  Left Kidney:  Normal in size and echogenicity with no mass, stone or hydronephrosis.  The left kidney measures 11.5 cm.  Bladder:  Normal  IMPRESSION: Normal renal ultrasound.   Original Report Authenticated By: Amie Portland, M.D.   Dg Knee Complete 4 Views Right  12/15/2012   *RADIOLOGY REPORT*  Clinical Data: Knee pain  RIGHT KNEE - COMPLETE 4+ VIEW  Comparison: 09/14/2010  Findings: Erosive changes along the lateral femoral condyle has progressed.  No displaced fracture.  No dislocation.  Large joint effusion.  Enthesopathic changes along the patella and tibial tubercle. Tricompartmental joint space narrowing with degenerative changes.  IMPRESSION: Lateral femoral condyle erosion and large joint effusion, suspicious for a crystalline arthropathy versus infection.  Tricompartmental DJD.   Original Report Authenticated By: Jearld Lesch, M.D.   Dg Fluoro Guide Ndl Plc/bx  12/15/2012   CLINICAL DATA:  72 year old male with acute on chronic right knee pain. Leukocytosis. Previous diagnosis of gout. Right knee effusion, similar to a 09/14/2010 comparison. Aspiration of  right knee joint effusion for laboratory analysis requested.  EXAM: RIGHT KNEE JOINT NEEDLE ASPIRATION UNDER FLUOROSCOPY.  FLUOROSCOPY TIME:  1 min and 8 seconds.  PROCEDURE: Procedure technique, risk and benefits discussed with the patient and written and verbal informed consent was obtained. A "time-out" was performed.  Palpation of the knee revealed suprapatellar swelling and firmness. Using fluoroscopy, the superior, inferior, and lateral margins of the patella were marked on the skin surface. A lateral approach for the joint aspiration was undertaken. Overlying skin prepped with Betadine, draped in the usual sterile fashion, and infiltrated locally with Lidocaine.  An 18 gauge needle was advanced from the lateral aspect of the knee, and after several attempts was advanced in the direction of the central patella without resistance . Fluoroscopic images demonstrate needle tip position near the central patella.  Initial aspiration did not yield any fluid despite repositioning of the needle.  5-10 mL of Omnipaque 300 contrast was then injected through the needle during fluoroscopic observation pooled in the vicinity of the patella.  Following this, aspiration yielded about 10 mL serosanguineous fluid. This aspirate was discarded, but an additional 12 mL of fluid with identical serosanguineous quality was subsequently aspirated using 10 mL and 30 mL syringes. These were capped and sent to the lab for analysis. No additional fluid could be aspirated. Palpation of the knee suggested decreased suprapatellar swelling.  The needle was withdrawn and direct pressure was held. Hemostasis was noted. The patient tolerated the procedure well and without difficulty. Betadine was removed and a Band-Aid was placed over the needle entry site.  IMPRESSION: Aspiration of serosanguineous fluid from the right knee under fluoroscopy. Approximately 12 mL of fluid was sent to the lab for analysis.   Electronically Signed   By: Augusto Gamble    On: 12/15/2012 11:59    DISCHARGE EXAMINATION: Filed Vitals:   12/17/12 0525 12/17/12 1400 12/17/12 2013 12/18/12 0526  BP: 176/72 180/82 190/78 179/70  Pulse:  84 84 79  Temp:  97.7 F (36.5 C) 97.5 F (36.4 C) 98.9 F (37.2 C)  TempSrc:  Oral Oral Oral  Resp:  18 16 18   Height:      Weight:      SpO2:  98% 99% 100%   General appearance: alert, cooperative, appears stated age, no distress and moderately obese Resp: Few crackles at bases which decrease with deep breathng. No wheezing. Cardio: regular rate and rhythm, S1, S2 normal, no murmur, click, rub or gallop  GI: soft, non-tender; bowel sounds normal; no masses,  no organomegaly Extremities: No edema Neurologic: No focal deficits.  DISPOSITION: Home  Discharge Orders   Future Appointments Provider Department Dept Phone   01/19/2013 1:00 PM Sherrie Mustache [facility] MEDICAL ONCOLOGY [phone]   01/19/2013 1:30 PM Si Gaul, MD [facility] MEDICAL ONCOLOGY [phone]   Future Orders Complete By Expires   Ambulatory referral to Nutrition and Diabetic Education  As directed    Diet Carb Modified  As directed    Discharge instructions  As directed    Comments:     PLEASE BE SURE TO FOLLOW UP WITH A PRIMARY CARE PHYSICIAN IN THE NEXT 7-10 DAYS. Please seek attention if you develop shortness of breath or chest pain. Take your insulin as prescribed.   Increase activity slowly  As directed    Other Restrictions  As directed    Comments:     Caution with climbing stairs.      ALLERGIES: No Known Allergies  Current Discharge Medication List    START taking these medications   Details  albuterol (PROVENTIL HFA;VENTOLIN HFA) 108 (90 BASE) MCG/ACT inhaler Inhale 2 puffs into the lungs 4 (four) times daily. Qty: 1 Inhaler, Refills: 2    amLODipine (NORVASC) 10 MG tablet Take 1 tablet (10 mg total) by mouth daily. Qty: 30 tablet, Refills: 2    ferrous sulfate 325 (65 FE) MG tablet  Take 1 tablet (325 mg total) by mouth 2 (two) times daily with a meal. Qty: 60 tablet, Refills: 3    folic acid (FOLVITE) 1 MG tablet Take 1 tablet (1 mg total) by mouth daily. Qty: 30 tablet, Refills: 1    furosemide (LASIX) 40 MG tablet Take one tablet daily for 4 days and then as needed for leg swelling. Qty: 30 tablet, Refills: 0    insulin glargine (LANTUS) 100 UNIT/ML injection Inject 0.1 mLs (10 Units total) into the skin at bedtime. Qty: 10 mL, Refills: 12    oxyCODONE-acetaminophen (PERCOCET/ROXICET) 5-325 MG per tablet Take 1 tablet by mouth every 6 (six) hours as needed. Qty: 30 tablet, Refills: 0    potassium chloride (K-DUR) 10 MEQ tablet Take 2 tablets daily for 4 days and then take 2 tablets whenever you take lasix. Qty: 60 tablet, Refills: 0    predniSONE (DELTASONE) 10 MG tablet Take 4 tabs once daily for 3 days, then take 3 tabs once daily for 3 days, then take 2 tabs once daily for 3 days, then take 1 tab once daily for 3 days and then STOP. Qty: 30 tablet, Refills: 0    thiamine 100 MG tablet Take 1 tablet (100 mg total) by mouth daily. Qty: 30 tablet, Refills: 1      STOP taking these medications     ibuprofen (ADVIL,MOTRIN) 200 MG tablet        Follow-up Information   Follow up with [facility] K., MD. Schedule an appointment as soon as possible for a visit in 1 month. (Please schedule this appointment to discuss abnormalities in blood work (Abnormal SPEP))    Specialty:  Oncology   Contact information:   [address] [place] [address] [phone]       TOTAL DISCHARGE TIME: 35 mins  [facility]  [facility] Pager [phone]  12/18/2012, 10:23 AM

## 2012-12-18 NOTE — Progress Notes (Signed)
Patient discharged to home via wheelchair with family, discharge instructions reviewed with patient and family, who verbalized understanding. New RX's given to patient.

## 2012-12-19 NOTE — Plan of Care (Cosign Needed)
Received call that patient needs affordable insulin. Will change Lantus at HS to Novolin 70/30 5 units BID with meals. He also needs hardcopy scripts for glucometer, lancets and test strips faxed to [facility] in [place].  Junious Silk, ANP

## 2012-12-23 LAB — UIFE/LIGHT CHAINS/TP QN, 24-HR UR
Alpha 2, Urine: DETECTED — AB
Beta, Urine: DETECTED — AB
Free Lambda Lt Chains,Ur: 0.8 mg/dL — ABNORMAL HIGH (ref 0.02–0.67)
Gamma Globulin, Urine: DETECTED — AB

## 2012-12-23 LAB — GLUCOSE, CAPILLARY: Glucose-Capillary: 303 mg/dL — ABNORMAL HIGH (ref 70–99)

## 2012-12-31 ENCOUNTER — Telehealth: Payer: Self-pay | Admitting: Medical Oncology

## 2012-12-31 NOTE — Telephone Encounter (Signed)
Home health nurse called to repeort pt has high BP 180/90 . Pt has no PCP. I told Erie Noe that pt needs to get a PCP per hospitalist discharge instructions. Dr Arbutus Ped is not pts attending and has f/u in 2 months for anemia. She will f/u with pt.

## 2013-01-16 ENCOUNTER — Telehealth: Payer: Self-pay | Admitting: Internal Medicine

## 2013-01-16 NOTE — Telephone Encounter (Signed)
s.w. pt daughter Dois Davenport and advised on cx appt on 9.29.14 and r/s to 10.13.14...[facility] Kitchenpt # does not have vm...ok and aware

## 2013-01-19 ENCOUNTER — Ambulatory Visit: Payer: Medicare Other | Admitting: Internal Medicine

## 2013-01-19 ENCOUNTER — Other Ambulatory Visit: Payer: Medicare Other | Admitting: Lab

## 2013-02-03 ENCOUNTER — Other Ambulatory Visit (HOSPITAL_BASED_OUTPATIENT_CLINIC_OR_DEPARTMENT_OTHER): Payer: Medicare Other | Admitting: Lab

## 2013-02-03 ENCOUNTER — Ambulatory Visit (HOSPITAL_BASED_OUTPATIENT_CLINIC_OR_DEPARTMENT_OTHER): Payer: Medicare Other | Admitting: Internal Medicine

## 2013-02-03 ENCOUNTER — Encounter: Payer: Self-pay | Admitting: Internal Medicine

## 2013-02-03 VITALS — BP 177/63 | HR 72 | Temp 98.0°F | Resp 18 | Ht 71.0 in | Wt 230.0 lb

## 2013-02-03 DIAGNOSIS — D649 Anemia, unspecified: Secondary | ICD-10-CM

## 2013-02-03 DIAGNOSIS — C61 Malignant neoplasm of prostate: Secondary | ICD-10-CM

## 2013-02-03 LAB — CBC WITH DIFFERENTIAL/PLATELET
BASO%: 0.9 % (ref 0.0–2.0)
Basophils Absolute: 0.1 10*3/uL (ref 0.0–0.1)
EOS%: 1.1 % (ref 0.0–7.0)
HGB: 8.7 g/dL — ABNORMAL LOW (ref 13.0–17.1)
MCH: 25.1 pg — ABNORMAL LOW (ref 27.2–33.4)
MCHC: 32.9 g/dL (ref 32.0–36.0)
MCV: 76.2 fL — ABNORMAL LOW (ref 79.3–98.0)
MONO%: 6.6 % (ref 0.0–14.0)
RDW: 19.1 % — ABNORMAL HIGH (ref 11.0–14.6)

## 2013-02-03 LAB — IRON AND TIBC CHCC: Iron: 45 ug/dL (ref 42–163)

## 2013-02-03 NOTE — Patient Instructions (Signed)
I ordered a bone marrow biopsy and aspirate to be performed in the next few weeks. Followup visit in 3 weeks for evaluation

## 2013-02-03 NOTE — Progress Notes (Signed)
[facility] [phone]   Fax:([phone]  OFFICE PROGRESS NOTE   DIAGNOSIS:  1) Iron deficiency anemia.  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 Brunilda Payor.   PRIOR THERAPY: Feraheme Infusion last dose was given 12/05/2012   CURRENT THERAPY: None.  INTERVAL HISTORY: Roger Wood 72 y.o. male returns to the clinic today for followup visit accompanied his wife. The patient is feeling fine today with no specific complaints except for mild fatigue. He denied having any dizzy spells. He denied having any chest pain, shortness of breath, cough or hemoptysis. His blood pressure still elevated and the patient mentioned that his systolic blood pressures are within the range of 170. He denied having any significant weight loss or night sweats. He denied having any nausea or vomiting or rectal bleeding. The patient had repeat CBC and iron study performed earlier today and is here for evaluation and discussion of his lab results.  MEDICAL HISTORY: Past Medical History  Diagnosis Date  . Hypertension   . Gout   . Arthritis   . Diabetes mellitus     adult onset   . History of radiation therapy 01/27/11-03/26/11    prostate  . Refusal of blood transfusions as patient is Jehovah's Witness   . Cataract     b/l catartact surgery  . Anemia     takes iron supplements  . Type II diabetes mellitus with nephropathy   . Prostate cancer 07/14/10    dx    ALLERGIES:  has No Known Allergies.  MEDICATIONS:  Current Outpatient Prescriptions  Medication Sig Dispense Refill  . albuterol (PROVENTIL HFA;VENTOLIN HFA) 108 (90 BASE) MCG/ACT inhaler Inhale 2 puffs into the lungs 4 (four) times daily.  1 Inhaler  2  . amLODipine (NORVASC) 10 MG tablet Take 1 tablet (10 mg total) by mouth daily.  30 tablet  2  . ferrous sulfate 325 (65 FE) MG tablet Take 1 tablet (325 mg total) by mouth  2 (two) times daily with a meal.  60 tablet  3  . folic acid (FOLVITE) 1 MG tablet Take 1 tablet (1 mg total) by mouth daily.  30 tablet  1  . furosemide (LASIX) 40 MG tablet Take one tablet daily for 4 days and then as needed for leg swelling.  30 tablet  0  . insulin glargine (LANTUS) 100 UNIT/ML injection Inject 20 Units into the skin at bedtime.      Marland Kitchen oxyCODONE-acetaminophen (PERCOCET/ROXICET) 5-325 MG per tablet Take 1 tablet by mouth every 6 (six) hours as needed.  30 tablet  0  . potassium chloride (K-DUR) 10 MEQ tablet Take 2 tablets daily for 4 days and then take 2 tablets whenever you take lasix.  60 tablet  0  . predniSONE (DELTASONE) 10 MG tablet Take 4 tabs once daily for 3 days, then take 3 tabs once daily for 3 days, then take 2 tabs once daily for 3 days, then take 1 tab once daily for 3 days and then STOP.  30 tablet  0  . thiamine 100 MG tablet Take 1 tablet (100 mg total) by mouth daily.  30 tablet  1   No current facility-administered medications for this visit.    REVIEW OF SYSTEMS:  A comprehensive review of systems was negative except for: Constitutional: positive for fatigue   PHYSICAL EXAMINATION: General appearance: alert, cooperative, fatigued and  no distress Head: Normocephalic, without obvious abnormality, atraumatic Neck: no adenopathy, no JVD, supple, symmetrical, trachea midline and thyroid not enlarged, symmetric, no tenderness/mass/nodules Lymph nodes: Cervical, supraclavicular, and axillary nodes normal. Resp: clear to auscultation bilaterally Back: symmetric, no curvature. ROM normal. No CVA tenderness. Cardio: regular rate and rhythm, S1, S2 normal, no murmur, click, rub or gallop GI: soft, non-tender; bowel sounds normal; no masses,  no organomegaly Extremities: extremities normal, atraumatic, no cyanosis or edema  ECOG PERFORMANCE STATUS: 1 - Symptomatic but completely ambulatory  Blood pressure 177/63, pulse 72, temperature 98 F (36.7 C),  temperature source Oral, resp. rate 18, height 5\' 11"  (1.803 m), weight 230 lb (104.327 kg).  LABORATORY DATA: Lab Results  Component Value Date   WBC 9.0 02/03/2013   HGB 8.7* 02/03/2013   HCT 26.5* 02/03/2013   MCV 76.2* 02/03/2013   PLT 383 02/03/2013      Chemistry      Component Value Date/Time   NA 135 12/18/2012 0333   NA 134* 11/21/2012 1127   K 3.4* 12/18/2012 0333   K 5.0 11/21/2012 1127   CL 102 12/18/2012 0333   CL 106 01/29/2012 1520   CO2 21 12/18/2012 0333   CO2 18* 11/21/2012 1127   BUN 52* 12/18/2012 0333   BUN 41.1* 11/21/2012 1127   CREATININE 1.96* 12/18/2012 0333   CREATININE 2.4* 11/21/2012 1127      Component Value Date/Time   CALCIUM 8.7 12/18/2012 0333   CALCIUM 9.3 11/21/2012 1127   ALKPHOS 141* 12/15/2012 0400   ALKPHOS 98 11/21/2012 1127   AST 31 12/15/2012 0400   AST 9 11/21/2012 1127   ALT 63* 12/15/2012 0400   ALT 12 11/21/2012 1127   BILITOT 0.2* 12/15/2012 0400   BILITOT 0.28 11/21/2012 1127       RADIOGRAPHIC STUDIES: No results found.  ASSESSMENT AND PLAN: This is a very pleasant 72 years old African American male with persistent anemia most likely secondary to iron deficiency but I cannot rule out any other etiology including myelodysplastic syndrome at this point especially with persistent anemia after correction of his iron. His iron study is still pending today. I recommended for the patient to consider proceeding with a bone marrow biopsy and aspirate to rule out any myelodysplasia. I will arrange for the bone marrow biopsy and aspirate to be performed by interventional radiology. I would see him back for followup visit in 3 weeks for evaluation and discussion of his biopsy results. He was advised to call immediately if he has any concerning symptoms in the interval. The patient voices understanding of current disease status and treatment options and is in agreement with the current care plan.  All questions were answered. The patient knows to call the  clinic with any problems, questions or concerns. We can certainly see the patient much sooner if necessary.

## 2013-02-04 ENCOUNTER — Encounter (HOSPITAL_COMMUNITY): Payer: Self-pay | Admitting: Pharmacy Technician

## 2013-02-04 ENCOUNTER — Telehealth: Payer: Self-pay | Admitting: Internal Medicine

## 2013-02-04 NOTE — Telephone Encounter (Signed)
s.w. pt wife and advised on bx and 11.5.14 appt...ok and aware

## 2013-02-05 ENCOUNTER — Other Ambulatory Visit: Payer: Self-pay | Admitting: Radiology

## 2013-02-11 ENCOUNTER — Ambulatory Visit (HOSPITAL_COMMUNITY)
Admission: RE | Admit: 2013-02-11 | Discharge: 2013-02-11 | Disposition: A | Discharge status: Home / Self Care | Payer: Medicare Other | Source: Ambulatory Visit | Attending: Internal Medicine | Admitting: Internal Medicine

## 2013-02-11 ENCOUNTER — Encounter (HOSPITAL_COMMUNITY): Payer: Self-pay

## 2013-02-11 DIAGNOSIS — Z8546 Personal history of malignant neoplasm of prostate: Secondary | ICD-10-CM | POA: Insufficient documentation

## 2013-02-11 DIAGNOSIS — I10 Essential (primary) hypertension: Secondary | ICD-10-CM | POA: Insufficient documentation

## 2013-02-11 DIAGNOSIS — E1129 Type 2 diabetes mellitus with other diabetic kidney complication: Secondary | ICD-10-CM | POA: Insufficient documentation

## 2013-02-11 DIAGNOSIS — Z923 Personal history of irradiation: Secondary | ICD-10-CM | POA: Insufficient documentation

## 2013-02-11 DIAGNOSIS — D509 Iron deficiency anemia, unspecified: Secondary | ICD-10-CM | POA: Insufficient documentation

## 2013-02-11 DIAGNOSIS — D649 Anemia, unspecified: Secondary | ICD-10-CM

## 2013-02-11 DIAGNOSIS — Z794 Long term (current) use of insulin: Secondary | ICD-10-CM | POA: Insufficient documentation

## 2013-02-11 DIAGNOSIS — N058 Unspecified nephritic syndrome with other morphologic changes: Secondary | ICD-10-CM | POA: Insufficient documentation

## 2013-02-11 LAB — CBC
HCT: 27.3 % — ABNORMAL LOW (ref 39.0–52.0)
Hemoglobin: 9.1 g/dL — ABNORMAL LOW (ref 13.0–17.0)
MCV: 75.4 fL — ABNORMAL LOW (ref 78.0–100.0)
RBC: 3.62 MIL/uL — ABNORMAL LOW (ref 4.22–5.81)
WBC: 9.9 10*3/uL (ref 4.0–10.5)

## 2013-02-11 LAB — GLUCOSE, CAPILLARY: Glucose-Capillary: 106 mg/dL — ABNORMAL HIGH (ref 70–99)

## 2013-02-11 LAB — BONE MARROW EXAM

## 2013-02-11 MED ORDER — FENTANYL CITRATE 0.05 MG/ML IJ SOLN
INTRAMUSCULAR | Status: AC
  Filled 2013-02-11: qty 4

## 2013-02-11 MED ORDER — FENTANYL CITRATE 0.05 MG/ML IJ SOLN
INTRAMUSCULAR | Status: AC | PRN
  Administered 2013-02-11: 100 ug via INTRAVENOUS

## 2013-02-11 MED ORDER — MIDAZOLAM HCL 2 MG/2ML IJ SOLN
INTRAMUSCULAR | Status: AC
  Filled 2013-02-11: qty 6

## 2013-02-11 MED ORDER — MIDAZOLAM HCL 2 MG/2ML IJ SOLN
INTRAMUSCULAR | Status: AC | PRN
  Administered 2013-02-11 (×2): 1 mg via INTRAVENOUS

## 2013-02-11 MED ORDER — MIDAZOLAM HCL 2 MG/2ML IJ SOLN
INTRAMUSCULAR | Status: AC
  Filled 2013-02-11: qty 4

## 2013-02-11 MED ORDER — FENTANYL CITRATE 0.05 MG/ML IJ SOLN
INTRAMUSCULAR | Status: AC
  Filled 2013-02-11: qty 6

## 2013-02-11 MED ORDER — SODIUM CHLORIDE 0.9 % IV SOLN
Freq: Once | INTRAVENOUS | Status: AC
  Administered 2013-02-11: 09:00:00 via INTRAVENOUS

## 2013-02-11 NOTE — H&P (Signed)
Roger Wood is an 72 y.o. male.   Chief Complaint: "I'm having a bone sampling" HPI: Patient with history of prostate cancer and persistent anemia (despite correction of his iron) presents today for CT guided bone marrow biopsy to r/o myelodysplasia.  Past Medical History  Diagnosis Date  . Hypertension   . Gout   . Arthritis   . Diabetes mellitus     adult onset   . History of radiation therapy 01/27/11-03/26/11    prostate  . Refusal of blood transfusions as patient is Jehovah's Witness   . Cataract     b/l catartact surgery  . Anemia     takes iron supplements  . Type II diabetes mellitus with nephropathy   . Prostate cancer 07/14/10    dx  renal insuff   History reviewed. No pertinent past surgical history.  Family History  Problem Relation Age of Onset  . Breast cancer Sister   . Breast cancer Paternal Aunt   . Colon cancer Neg Hx   . Diabetes Mother   . Diabetes Sister   . Diabetes Paternal Aunt   . Heart disease Father   . Stroke Paternal Uncle   . Cancer Paternal Uncle     back   Social History:  reports that he quit smoking about 45 years ago. His smoking use included Cigarettes. He smoked 0.00 packs per day. He has never used smokeless tobacco. He reports that he drinks alcohol. He reports that he does not use illicit drugs.  Allergies: No Known Allergies  Current outpatient prescriptions:albuterol (PROVENTIL HFA;VENTOLIN HFA) 108 (90 BASE) MCG/ACT inhaler, Inhale 2 puffs into the lungs 4 (four) times daily as needed for shortness of breath., Disp: , Rfl: ;  febuxostat (ULORIC) 40 MG tablet, Take 40 mg by mouth every morning., Disp: , Rfl: ;  ferrous sulfate 325 (65 FE) MG tablet, Take 1 tablet (325 mg total) by mouth 2 (two) times daily with a meal., Disp: 60 tablet, Rfl: 3 furosemide (LASIX) 40 MG tablet, Take 40 mg by mouth daily as needed (For leg swelling.)., Disp: , Rfl: ;  amLODipine (NORVASC) 10 MG tablet, Take 10 mg by mouth every morning., Disp: , Rfl: ;   folic acid (FOLVITE) 1 MG tablet, Take 1 mg by mouth every morning., Disp: , Rfl: ;  insulin glargine (LANTUS) 100 UNIT/ML injection, Inject 20 Units into the skin at bedtime., Disp: , Rfl:  oxyCODONE-acetaminophen (PERCOCET/ROXICET) 5-325 MG per tablet, Take 1 tablet by mouth every 6 (six) hours as needed for pain., Disp: , Rfl: ;  potassium chloride (K-DUR,KLOR-CON) 10 MEQ tablet, Take 20 mEq by mouth daily as needed (For potassium loss.). , Disp: , Rfl: ;  thiamine 100 MG tablet, Take 1 tablet (100 mg total) by mouth daily., Disp: 30 tablet, Rfl: 1 Current facility-administered medications:fentaNYL (SUBLIMAZE) 0.05 MG/ML injection, , , , ;  midazolam (VERSED) 2 MG/2ML injection, , , ,    Results for orders placed during the hospital encounter of 02/11/13 (from the past 48 hour(s))  CBC     Status: Abnormal   Collection Time    02/11/13  9:20 AM      Result Value Range   WBC 9.9  4.0 - 10.5 K/uL   RBC 3.62 (*) 4.22 - 5.81 MIL/uL   Hemoglobin 9.1 (*) 13.0 - 17.0 g/dL   HCT 16.1 (*) 09.6 - 04.5 %   MCV 75.4 (*) 78.0 - 100.0 fL   MCH 25.1 (*) 26.0 - 34.0 pg  MCHC 33.3  30.0 - 36.0 g/dL   RDW 78.2 (*) 95.6 - 21.3 %   Platelets 311  150 - 400 K/uL   No results found.  Review of Systems  Constitutional: Positive for malaise/fatigue. Negative for fever and chills.  Respiratory: Negative for cough and shortness of breath.   Cardiovascular: Negative for chest pain.  Gastrointestinal: Negative for vomiting, abdominal pain and blood in stool.       Occ nausea  Genitourinary: Negative for hematuria.  Musculoskeletal: Negative for back pain.       Occ joint pain secondary to gout  Neurological: Negative for headaches.  Endo/Heme/Allergies: Does not bruise/bleed easily.    Blood pressure 168/69, pulse 67, temperature 98 F (36.7 C), temperature source Oral, resp. rate 18, height 5\' 11"  (1.803 m), weight 230 lb (104.327 kg), SpO2 100.00%. Physical Exam  Constitutional: He is oriented to  person, place, and time. He appears well-developed and well-nourished.  Cardiovascular: Normal rate.   occ ectopy noted  Respiratory: Effort normal and breath sounds normal.  GI: Soft. Bowel sounds are normal. There is no tenderness.  Musculoskeletal: He exhibits no edema.  Neurological: He is alert and oriented to person, place, and time.     Assessment/Plan Pt with hx prostate cancer and persistent anemia (despite correction of iron). Plan is for CT guided bone marrow biopsy today to r/o myelodysplasia. Details/risks of procedure d/w pt/family with their understanding and consent.  ALLRED,D KEVIN 02/11/2013, 9:43 AM

## 2013-02-11 NOTE — Procedures (Signed)
CT guided bone marrow aspirates (2) and core biopsy (1).  No immediate complication.

## 2013-02-20 LAB — CHROMOSOME ANALYSIS, BONE MARROW

## 2013-02-25 ENCOUNTER — Telehealth: Payer: Self-pay | Admitting: Internal Medicine

## 2013-02-25 ENCOUNTER — Encounter: Payer: Self-pay | Admitting: Internal Medicine

## 2013-02-25 ENCOUNTER — Ambulatory Visit (HOSPITAL_BASED_OUTPATIENT_CLINIC_OR_DEPARTMENT_OTHER): Payer: Medicare Other | Admitting: Internal Medicine

## 2013-02-25 ENCOUNTER — Other Ambulatory Visit (HOSPITAL_BASED_OUTPATIENT_CLINIC_OR_DEPARTMENT_OTHER): Payer: Medicare Other | Admitting: Lab

## 2013-02-25 VITALS — BP 182/76 | HR 95 | Temp 98.3°F | Resp 20

## 2013-02-25 DIAGNOSIS — D638 Anemia in other chronic diseases classified elsewhere: Secondary | ICD-10-CM

## 2013-02-25 DIAGNOSIS — D649 Anemia, unspecified: Secondary | ICD-10-CM

## 2013-02-25 DIAGNOSIS — C61 Malignant neoplasm of prostate: Secondary | ICD-10-CM

## 2013-02-25 DIAGNOSIS — D472 Monoclonal gammopathy: Secondary | ICD-10-CM

## 2013-02-25 LAB — CBC WITH DIFFERENTIAL/PLATELET
BASO%: 0.3 % (ref 0.0–2.0)
EOS%: 0.6 % (ref 0.0–7.0)
Eosinophils Absolute: 0.1 10*3/uL (ref 0.0–0.5)
MCHC: 32.4 g/dL (ref 32.0–36.0)
MCV: 76.9 fL — ABNORMAL LOW (ref 79.3–98.0)
MONO#: 1.1 10*3/uL — ABNORMAL HIGH (ref 0.1–0.9)
Platelets: 277 10*3/uL (ref 140–400)
RBC: 3.48 10*6/uL — ABNORMAL LOW (ref 4.20–5.82)
RDW: 19.7 % — ABNORMAL HIGH (ref 11.0–14.6)
WBC: 10.5 10*3/uL — ABNORMAL HIGH (ref 4.0–10.3)
lymph#: 0.7 10*3/uL — ABNORMAL LOW (ref 0.9–3.3)

## 2013-02-25 NOTE — Telephone Encounter (Signed)
LVMM I would call pt at home again to go over multiple appts from 11/4 POF shh

## 2013-02-25 NOTE — Progress Notes (Signed)
[facility] Telephone:([phone]   Fax:(336) [phone]  OFFICE PROGRESS NOTE   DIAGNOSIS:  1) anemia of chronic disease.  2) monoclonal gammopathy of undetermined significance. 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 Brunilda Payor.   PRIOR THERAPY: Feraheme Infusion last dose was given 12/05/2012   CURRENT THERAPY: Aranesp 300 mcg subcutaneously every 3 weeks.  INTERVAL HISTORY: Roger Wood 72 y.o. male returns to the clinic today for followup visit accompanied his wife. The patient is feeling fine today with no specific complaints except for mild fatigue. He denied having any chest pain, shortness of breath, cough or hemoptysis. His blood pressure still elevated and the patient mentioned that his systolic blood pressures are within the range of 170. He is currently on treatment with Norvasc but has not seen his primary care physician on the last few months. He denied having any significant weight loss or night sweats. He denied having any nausea or vomiting or rectal bleeding. The patient recently underwent a bone marrow biopsy and aspirate and he is here for evaluation and discussion of his biopsy results and recommendation regarding treatment of his condition.  MEDICAL HISTORY: Past Medical History  Diagnosis Date  . Hypertension   . Gout   . Arthritis   . Diabetes mellitus     adult onset   . History of radiation therapy 01/27/11-03/26/11    prostate  . Refusal of blood transfusions as patient is Jehovah's Witness   . Cataract     b/l catartact surgery  . Anemia     takes iron supplements  . Type II diabetes mellitus with nephropathy   . Prostate cancer 07/14/10    dx    ALLERGIES:  has No Known Allergies.  MEDICATIONS:  Current Outpatient Prescriptions  Medication Sig Dispense Refill  . albuterol (PROVENTIL HFA;VENTOLIN HFA) 108 (90 BASE) MCG/ACT  inhaler Inhale 2 puffs into the lungs 4 (four) times daily as needed for shortness of breath.      Marland Kitchen amLODipine (NORVASC) 10 MG tablet Take 10 mg by mouth every morning.      . febuxostat (ULORIC) 40 MG tablet Take 40 mg by mouth every morning.      . ferrous sulfate 325 (65 FE) MG tablet Take 1 tablet (325 mg total) by mouth 2 (two) times daily with a meal.  60 tablet  3  . folic acid (FOLVITE) 1 MG tablet Take 1 mg by mouth every morning.      . furosemide (LASIX) 40 MG tablet Take 40 mg by mouth daily as needed (For leg swelling.).      Marland Kitchen insulin glargine (LANTUS) 100 UNIT/ML injection Inject 20 Units into the skin at bedtime.      Marland Kitchen oxyCODONE-acetaminophen (PERCOCET/ROXICET) 5-325 MG per tablet Take 1 tablet by mouth every 6 (six) hours as needed for pain.      . potassium chloride (K-DUR,KLOR-CON) 10 MEQ tablet Take 20 mEq by mouth daily as needed (For potassium loss.).       Marland Kitchen thiamine 100 MG tablet Take 1 tablet (100 mg total) by mouth daily.  30 tablet  1   No current facility-administered medications for this visit.    REVIEW OF SYSTEMS:  Constitutional: positive for fatigue Eyes: negative Ears, nose, mouth, throat, and face: negative Respiratory: positive for dyspnea on exertion Cardiovascular: negative Gastrointestinal: negative Genitourinary:negative  Integument/breast: negative Hematologic/lymphatic: negative Musculoskeletal:negative Neurological: negative Behavioral/Psych: negative Endocrine: negative Allergic/Immunologic: negative   PHYSICAL EXAMINATION: General appearance: alert, cooperative, fatigued and no distress Head: Normocephalic, without obvious abnormality, atraumatic Neck: no adenopathy, no JVD, supple, symmetrical, trachea midline and thyroid not enlarged, symmetric, no tenderness/mass/nodules Lymph nodes: Cervical, supraclavicular, and axillary nodes normal. Resp: clear to auscultation bilaterally Back: symmetric, no curvature. ROM normal. No CVA  tenderness. Cardio: regular rate and rhythm, S1, S2 normal, no murmur, click, rub or gallop GI: soft, non-tender; bowel sounds normal; no masses,  no organomegaly Extremities: extremities normal, atraumatic, no cyanosis or edema  ECOG PERFORMANCE STATUS: 2 - Symptomatic, <50% confined to bed  Blood pressure 182/76, pulse 95, temperature 98.3 F (36.8 C), temperature source Oral, resp. rate 20, weight 0 lb (0 kg).  LABORATORY DATA: Lab Results  Component Value Date   WBC 10.5* 02/25/2013   HGB 8.7* 02/25/2013   HCT 26.8* 02/25/2013   MCV 76.9* 02/25/2013   PLT 277 02/25/2013      Chemistry      Component Value Date/Time   NA 135 12/18/2012 0333   NA 134* 11/21/2012 1127   K 3.4* 12/18/2012 0333   K 5.0 11/21/2012 1127   CL 102 12/18/2012 0333   CL 106 01/29/2012 1520   CO2 21 12/18/2012 0333   CO2 18* 11/21/2012 1127   BUN 52* 12/18/2012 0333   BUN 41.1* 11/21/2012 1127   CREATININE 1.96* 12/18/2012 0333   CREATININE 2.4* 11/21/2012 1127      Component Value Date/Time   CALCIUM 8.7 12/18/2012 0333   CALCIUM 9.3 11/21/2012 1127   ALKPHOS 141* 12/15/2012 0400   ALKPHOS 98 11/21/2012 1127   AST 31 12/15/2012 0400   AST 9 11/21/2012 1127   ALT 63* 12/15/2012 0400   ALT 12 11/21/2012 1127   BILITOT 0.2* 12/15/2012 0400   BILITOT 0.28 11/21/2012 1127       RADIOGRAPHIC STUDIES: Ct Biopsy  02/11/2013   CLINICAL DATA:  72 year old with history of prostate cancer and anemia.  EXAM: CT GUIDED BONE MARROW ASPIRATES AND BIOPSY  Physician: Rachelle Hora. Henn, MD  MEDICATIONS: Versed 2 mg, fentanyl 100 mcg. A radiology nurse monitored the patient for moderate sedation.  ANESTHESIA/SEDATION: Sedation time: 8 min  PROCEDURE: The procedure was explained to the patient. The risks and benefits of the procedure were discussed and the patient's questions were addressed. Informed consent was obtained from the patient. The patient was placed prone on CT scan. Images of the pelvis were obtained. The right side of back was prepped  and draped in sterile fashion. The skin and right posterior iliac bone were anesthetized with 1% lidocaine. 11 gauge bone needle was directed into the right iliac bone with CT guidance. Two aspirates and one core biopsy obtained.  COMPLICATIONS: None  FINDINGS: Bone biopsy needle directed into the posterior right iliac bone.  IMPRESSION: CT guided bone marrow aspirates and core biopsy.   Electronically Signed   By: Richarda Overlie M.D.   On: 02/11/2013 12:32   BNE MARROW REPORT FINAL DIAGNOSIS Diagnosis Bone Marrow, Aspirate,Biopsy, and Clot, right iliac - HYPERCELLULAR BONE MARROW FOR AGE WITH TRILINEAGE HEMATOPOIESIS. - 7% PLASMA CELLS. - INCREASED IRON STORES. - SEE COMMENT. PERIPHERAL BLOOD: - MICROCYTIC ANEMIA. Diagnosis Note The bone marrow is hypercellular for age with trilineage hematopoiesis and nonspecific changes. While iron stores are increased, significant dyserythropoiesis or ring sideroblasts are not present. Plasma cells are slightly increased in number representing 7% of all cells with  lack of large aggregates or sheets. Immunohistochemical stains for kappa and lambda light chains show a polyclonal staining pattern in plasma cells although there is slight kappa light chain excess. The overall features are nonspecific and certainly not diagnostic of plasma cell dyscrasia/neoplasm. Clinical correlation is recommended. (BNS:caf 02/12/13) Guerry Bruin MD Pathologist, Electronic Signature (Case signed 02/13/2013)  ASSESSMENT AND PLAN: This is a very pleasant 72 years old African American male with persistent anemia most likely secondary to anemia of chronic disease/ iron deficiency anemia.  His bone marrow biopsy and aspirate showed no significant abnormalities but increased plasma cells to 7% suspicious for monoclonal gammopathy.  I discussed the biopsy results with the patient and his wife today. The patient continues to have persistent anemia and he is Jehovah's Witness. I  recommended for him treatment with Aranesp 300 mcg subcutaneously every 3 weeks for the anemia of chronic disease. I discussed with the patient adverse effect of this treatment including high risk for stroke and cardiac disease. The patient continues to have persistent hypertension and I strongly encouraged him to take his medication regularly and to check with his primary care physician for adjustment of his antihypertensive medication. I would see him back for followup visit in 3 months with repeat CBC and iron study. He was advised to call immediately if he has any concerning symptoms in the interval. The patient voices understanding of current disease status and treatment options and is in agreement with the current care plan.  All questions were answered. The patient knows to call the clinic with any problems, questions or concerns. We can certainly see the patient much sooner if necessary.

## 2013-02-25 NOTE — Patient Instructions (Signed)
We discussed treatment options for your persistent anemia including treatment with Aranesp 300 mcg subcutaneously every 3 weeks. Followup visit in 3 months with repeat CBC and iron study as well as myeloma panel

## 2013-02-25 NOTE — Telephone Encounter (Signed)
sw pts wife went over Nov Dec Jan Feb schedules cal  and avs mailed to pt [facility]

## 2013-02-27 ENCOUNTER — Other Ambulatory Visit: Payer: Self-pay | Admitting: *Deleted

## 2013-02-27 DIAGNOSIS — D649 Anemia, unspecified: Secondary | ICD-10-CM

## 2013-03-02 ENCOUNTER — Other Ambulatory Visit (HOSPITAL_BASED_OUTPATIENT_CLINIC_OR_DEPARTMENT_OTHER): Payer: Medicare Other | Admitting: Lab

## 2013-03-02 ENCOUNTER — Ambulatory Visit (HOSPITAL_BASED_OUTPATIENT_CLINIC_OR_DEPARTMENT_OTHER): Payer: Medicare Other

## 2013-03-02 VITALS — BP 169/81 | HR 82 | Temp 97.6°F

## 2013-03-02 DIAGNOSIS — N189 Chronic kidney disease, unspecified: Secondary | ICD-10-CM

## 2013-03-02 DIAGNOSIS — D649 Anemia, unspecified: Secondary | ICD-10-CM

## 2013-03-02 DIAGNOSIS — D638 Anemia in other chronic diseases classified elsewhere: Secondary | ICD-10-CM

## 2013-03-02 DIAGNOSIS — D631 Anemia in chronic kidney disease: Secondary | ICD-10-CM

## 2013-03-02 LAB — CBC WITH DIFFERENTIAL/PLATELET
BASO%: 0.3 % (ref 0.0–2.0)
Basophils Absolute: 0 10e3/uL (ref 0.0–0.1)
EOS%: 1.7 % (ref 0.0–7.0)
Eosinophils Absolute: 0.2 10e3/uL (ref 0.0–0.5)
HCT: 27.1 % — ABNORMAL LOW (ref 38.4–49.9)
HGB: 8.8 g/dL — ABNORMAL LOW (ref 13.0–17.1)
LYMPH%: 13.6 % — ABNORMAL LOW (ref 14.0–49.0)
MCH: 24.6 pg — ABNORMAL LOW (ref 27.2–33.4)
MCHC: 32.5 g/dL (ref 32.0–36.0)
MCV: 75.7 fL — ABNORMAL LOW (ref 79.3–98.0)
MONO#: 0.7 10e3/uL (ref 0.1–0.9)
MONO%: 8.4 % (ref 0.0–14.0)
NEUT#: 6.6 10e3/uL — ABNORMAL HIGH (ref 1.5–6.5)
NEUT%: 76 % — ABNORMAL HIGH (ref 39.0–75.0)
Platelets: 369 10e3/uL (ref 140–400)
RBC: 3.58 10e6/uL — ABNORMAL LOW (ref 4.20–5.82)
RDW: 17.5 % — ABNORMAL HIGH (ref 11.0–14.6)
WBC: 8.6 10e3/uL (ref 4.0–10.3)
lymph#: 1.2 10e3/uL (ref 0.9–3.3)
nRBC: 0 % (ref 0–0)

## 2013-03-02 MED ORDER — DARBEPOETIN ALFA-POLYSORBATE 300 MCG/0.6ML IJ SOLN
300.0000 ug | Freq: Once | INTRAMUSCULAR | Status: AC
  Administered 2013-03-02: 300 ug via SUBCUTANEOUS
  Filled 2013-03-02: qty 0.6

## 2013-03-02 NOTE — Patient Instructions (Signed)
Darbepoetin Alfa injection What is this medicine? DARBEPOETIN ALFA (dar be POE e tin AL fa) helps your body make more red blood cells. It is used to treat anemia caused by chronic kidney failure and chemotherapy. This medicine may be used for other purposes; ask your health care provider or pharmacist if you have questions. COMMON BRAND NAME(S): Aranesp What should I tell my health care provider before I take this medicine? They need to know if you have any of these conditions: -blood clotting disorders or history of blood clots -cancer patient not on chemotherapy -cystic fibrosis -heart disease, such as angina, heart failure, or a history of a heart attack -hemoglobin level of 12 g/dL or greater -high blood pressure -low levels of folate, iron, or vitamin B12 -seizures -an unusual or allergic reaction to darbepoetin, erythropoietin, albumin, hamster proteins, latex, other medicines, foods, dyes, or preservatives -pregnant or trying to get pregnant -breast-feeding How should I use this medicine? This medicine is for injection into a vein or under the skin. It is usually given by a health care professional in a hospital or clinic setting. If you get this medicine at home, you will be taught how to prepare and give this medicine. Do not shake the solution before you withdraw a dose. Use exactly as directed. Take your medicine at regular intervals. Do not take your medicine more often than directed. It is important that you put your used needles and syringes in a special sharps container. Do not put them in a trash can. If you do not have a sharps container, call your pharmacist or healthcare provider to get one. Talk to your pediatrician regarding the use of this medicine in children. While this medicine may be used in children as young as 1 year for selected conditions, precautions do apply. Overdosage: If you think you have taken too much of this medicine contact a poison control center or  emergency room at once. NOTE: This medicine is only for you. Do not share this medicine with others. What if I miss a dose? If you miss a dose, take it as soon as you can. If it is almost time for your next dose, take only that dose. Do not take double or extra doses. What may interact with this medicine? Do not take this medicine with any of the following medications: -epoetin alfa This list may not describe all possible interactions. Give your health care provider a list of all the medicines, herbs, non-prescription drugs, or dietary supplements you use. Also tell them if you smoke, drink alcohol, or use illegal drugs. Some items may interact with your medicine. What should I watch for while using this medicine? Visit your prescriber or health care professional for regular checks on your progress and for the needed blood tests and blood pressure measurements. It is especially important for the doctor to make sure your hemoglobin level is in the desired range, to limit the risk of potential side effects and to give you the best benefit. Keep all appointments for any recommended tests. Check your blood pressure as directed. Ask your doctor what your blood pressure should be and when you should contact him or her. As your body makes more red blood cells, you may need to take iron, folic acid, or vitamin B supplements. Ask your doctor or health care provider which products are right for you. If you have kidney disease continue dietary restrictions, even though this medication can make you feel better. Talk with your doctor or health   care professional about the foods you eat and the vitamins that you take. What side effects may I notice from receiving this medicine? Side effects that you should report to your doctor or health care professional as soon as possible: -allergic reactions like skin rash, itching or hives, swelling of the face, lips, or tongue -breathing problems -changes in vision -chest  pain -confusion, trouble speaking or understanding -feeling faint or lightheaded, falls -high blood pressure -muscle aches or pains -pain, swelling, warmth in the leg -rapid weight gain -severe headaches -sudden numbness or weakness of the face, arm or leg -trouble walking, dizziness, loss of balance or coordination -seizures (convulsions) -swelling of the ankles, feet, hands -unusually weak or tired Side effects that usually do not require medical attention (report to your doctor or health care professional if they continue or are bothersome): -diarrhea -fever, chills (flu-like symptoms) -headaches -nausea, vomiting -redness, stinging, or swelling at site where injected This list may not describe all possible side effects. Call your doctor for medical advice about side effects. You may report side effects to FDA at 1-800-FDA-1088. Where should I keep my medicine? Keep out of the reach of children. Store in a refrigerator between 2 and 8 degrees C (36 and 46 degrees F). Do not freeze. Do not shake. Throw away any unused portion if using a single-dose vial. Throw away any unused medicine after the expiration date. NOTE: This sheet is a summary. It may not cover all possible information. If you have questions about this medicine, talk to your doctor, pharmacist, or health care provider.  2014, [employer]. (2008-03-23 10:23:57)  

## 2013-03-02 NOTE — Progress Notes (Signed)
Patient in for Aranesp injection today. Patient states that during his office visit on 02/25/2013, MD informed him of his disease process and the need for treatment. Patient states that during that visit he was made aware of the benefits, the risks, and the adverse effects of Aranesp. Aranesp information sheet given to Patient at this appointment. Patient consents to receiving Aranesp injection. Injection given as ordered. Patient tolerated injection well.  Zenaida Deed, LPN

## 2013-03-12 ENCOUNTER — Encounter (HOSPITAL_COMMUNITY): Payer: Self-pay

## 2013-03-18 ENCOUNTER — Other Ambulatory Visit: Payer: Medicare Other

## 2013-03-18 ENCOUNTER — Ambulatory Visit: Payer: Medicare Other

## 2013-03-23 ENCOUNTER — Ambulatory Visit (HOSPITAL_BASED_OUTPATIENT_CLINIC_OR_DEPARTMENT_OTHER): Payer: Medicare Other

## 2013-03-23 ENCOUNTER — Other Ambulatory Visit (HOSPITAL_BASED_OUTPATIENT_CLINIC_OR_DEPARTMENT_OTHER): Payer: Medicare Other

## 2013-03-23 VITALS — BP 196/89 | HR 90 | Temp 97.3°F

## 2013-03-23 DIAGNOSIS — D649 Anemia, unspecified: Secondary | ICD-10-CM

## 2013-03-23 DIAGNOSIS — D631 Anemia in chronic kidney disease: Secondary | ICD-10-CM

## 2013-03-23 DIAGNOSIS — N189 Chronic kidney disease, unspecified: Secondary | ICD-10-CM

## 2013-03-23 DIAGNOSIS — D638 Anemia in other chronic diseases classified elsewhere: Secondary | ICD-10-CM

## 2013-03-23 LAB — CBC WITH DIFFERENTIAL/PLATELET
BASO%: 0.7 % (ref 0.0–2.0)
Basophils Absolute: 0.1 10*3/uL (ref 0.0–0.1)
EOS%: 1.8 % (ref 0.0–7.0)
Eosinophils Absolute: 0.1 10*3/uL (ref 0.0–0.5)
HGB: 10.4 g/dL — ABNORMAL LOW (ref 13.0–17.1)
LYMPH%: 16.8 % (ref 14.0–49.0)
MCH: 25.4 pg — ABNORMAL LOW (ref 27.2–33.4)
MCHC: 31.9 g/dL — ABNORMAL LOW (ref 32.0–36.0)
MCV: 79.6 fL (ref 79.3–98.0)
MONO%: 9 % (ref 0.0–14.0)
NEUT%: 71.7 % (ref 39.0–75.0)
Platelets: 262 10*3/uL (ref 140–400)
RDW: 19.6 % — ABNORMAL HIGH (ref 11.0–14.6)
lymph#: 1.3 10*3/uL (ref 0.9–3.3)

## 2013-03-23 MED ORDER — DARBEPOETIN ALFA-POLYSORBATE 300 MCG/0.6ML IJ SOLN
300.0000 ug | Freq: Once | INTRAMUSCULAR | Status: AC
  Administered 2013-03-23: 300 ug via SUBCUTANEOUS
  Filled 2013-03-23: qty 0.6

## 2013-03-23 NOTE — Progress Notes (Signed)
BP 196/89-90.  Did not taken BP medicine this morning.  Discussed with Dr Arbutus Ped.  Instructed patient to take medicine and monitor his BP and if BP doesn't get better to contact PCP.

## 2013-03-27 ENCOUNTER — Other Ambulatory Visit: Payer: Medicare Other

## 2013-04-08 ENCOUNTER — Ambulatory Visit: Payer: Medicare Other

## 2013-04-08 ENCOUNTER — Other Ambulatory Visit: Payer: Medicare Other | Admitting: Lab

## 2013-04-13 ENCOUNTER — Other Ambulatory Visit (HOSPITAL_BASED_OUTPATIENT_CLINIC_OR_DEPARTMENT_OTHER): Payer: Medicare Other

## 2013-04-13 ENCOUNTER — Ambulatory Visit: Payer: Medicare Other

## 2013-04-13 DIAGNOSIS — D649 Anemia, unspecified: Secondary | ICD-10-CM

## 2013-04-13 LAB — CBC WITH DIFFERENTIAL/PLATELET
BASO%: 0.4 % (ref 0.0–2.0)
Basophils Absolute: 0 10*3/uL (ref 0.0–0.1)
EOS%: 1 % (ref 0.0–7.0)
LYMPH%: 10.7 % — ABNORMAL LOW (ref 14.0–49.0)
MCH: 26 pg — ABNORMAL LOW (ref 27.2–33.4)
MCV: 80.9 fL (ref 79.3–98.0)
MONO%: 6.9 % (ref 0.0–14.0)
NEUT#: 9.1 10*3/uL — ABNORMAL HIGH (ref 1.5–6.5)
Platelets: 262 10*3/uL (ref 140–400)
RBC: 4.53 10*6/uL (ref 4.20–5.82)
RDW: 19.2 % — ABNORMAL HIGH (ref 11.0–14.6)

## 2013-04-13 MED ORDER — DARBEPOETIN ALFA-POLYSORBATE 300 MCG/0.6ML IJ SOLN: 300.0000 ug | Freq: Once | INTRAMUSCULAR | Status: DC

## 2013-05-01 ENCOUNTER — Telehealth: Payer: Self-pay | Admitting: Internal Medicine

## 2013-05-01 NOTE — Telephone Encounter (Signed)
s.w. pt wife and r/s 1.12.14 appt to thrusday per pt request

## 2013-05-04 ENCOUNTER — Ambulatory Visit: Payer: Medicare Other

## 2013-05-04 ENCOUNTER — Other Ambulatory Visit: Payer: Medicare Other

## 2013-05-07 ENCOUNTER — Ambulatory Visit: Payer: Medicare Other

## 2013-05-07 ENCOUNTER — Other Ambulatory Visit (HOSPITAL_BASED_OUTPATIENT_CLINIC_OR_DEPARTMENT_OTHER): Payer: Medicare Other

## 2013-05-07 DIAGNOSIS — D649 Anemia, unspecified: Secondary | ICD-10-CM

## 2013-05-07 LAB — CBC WITH DIFFERENTIAL/PLATELET
BASO%: 0.4 % (ref 0.0–2.0)
BASOS ABS: 0 10*3/uL (ref 0.0–0.1)
EOS%: 0.9 % (ref 0.0–7.0)
Eosinophils Absolute: 0.1 10*3/uL (ref 0.0–0.5)
HEMATOCRIT: 35.2 % — AB (ref 38.4–49.9)
HEMOGLOBIN: 11.5 g/dL — AB (ref 13.0–17.1)
LYMPH%: 11.9 % — ABNORMAL LOW (ref 14.0–49.0)
MCH: 26.3 pg — AB (ref 27.2–33.4)
MCHC: 32.8 g/dL (ref 32.0–36.0)
MCV: 80.1 fL (ref 79.3–98.0)
MONO#: 0.8 10*3/uL (ref 0.1–0.9)
MONO%: 8.8 % (ref 0.0–14.0)
NEUT#: 7.2 10*3/uL — ABNORMAL HIGH (ref 1.5–6.5)
NEUT%: 78 % — AB (ref 39.0–75.0)
PLATELETS: 179 10*3/uL (ref 140–400)
RBC: 4.39 10*6/uL (ref 4.20–5.82)
RDW: 18.8 % — ABNORMAL HIGH (ref 11.0–14.6)
WBC: 9.2 10*3/uL (ref 4.0–10.3)
lymph#: 1.1 10*3/uL (ref 0.9–3.3)

## 2013-05-07 MED ORDER — DARBEPOETIN ALFA-POLYSORBATE 300 MCG/0.6ML IJ SOLN: 300.0000 ug | Freq: Once | INTRAMUSCULAR | Status: DC

## 2013-05-08 ENCOUNTER — Other Ambulatory Visit: Payer: Medicare Other | Admitting: Lab

## 2013-05-08 ENCOUNTER — Ambulatory Visit: Payer: Medicare Other

## 2013-05-27 ENCOUNTER — Other Ambulatory Visit: Payer: Self-pay | Admitting: Internal Medicine

## 2013-05-28 ENCOUNTER — Other Ambulatory Visit (HOSPITAL_BASED_OUTPATIENT_CLINIC_OR_DEPARTMENT_OTHER): Payer: Medicare Other

## 2013-05-28 ENCOUNTER — Ambulatory Visit: Payer: Medicare Other

## 2013-05-28 DIAGNOSIS — D649 Anemia, unspecified: Secondary | ICD-10-CM

## 2013-05-28 DIAGNOSIS — N189 Chronic kidney disease, unspecified: Secondary | ICD-10-CM

## 2013-05-28 DIAGNOSIS — D638 Anemia in other chronic diseases classified elsewhere: Secondary | ICD-10-CM

## 2013-05-28 LAB — CBC WITH DIFFERENTIAL/PLATELET
BASO%: 0.4 % (ref 0.0–2.0)
BASOS ABS: 0 10*3/uL (ref 0.0–0.1)
EOS%: 1.1 % (ref 0.0–7.0)
Eosinophils Absolute: 0.1 10*3/uL (ref 0.0–0.5)
HEMATOCRIT: 34.1 % — AB (ref 38.4–49.9)
HEMOGLOBIN: 11.3 g/dL — AB (ref 13.0–17.1)
LYMPH#: 0.9 10*3/uL (ref 0.9–3.3)
LYMPH%: 11.5 % — AB (ref 14.0–49.0)
MCH: 25.9 pg — AB (ref 27.2–33.4)
MCHC: 33.1 g/dL (ref 32.0–36.0)
MCV: 78.3 fL — ABNORMAL LOW (ref 79.3–98.0)
MONO#: 0.6 10*3/uL (ref 0.1–0.9)
MONO%: 8 % (ref 0.0–14.0)
NEUT#: 6.4 10*3/uL (ref 1.5–6.5)
NEUT%: 79 % — ABNORMAL HIGH (ref 39.0–75.0)
PLATELETS: 247 10*3/uL (ref 140–400)
RBC: 4.36 10*6/uL (ref 4.20–5.82)
RDW: 18.5 % — ABNORMAL HIGH (ref 11.0–14.6)
WBC: 8.1 10*3/uL (ref 4.0–10.3)

## 2013-05-28 LAB — LACTATE DEHYDROGENASE (CC13): LDH: 144 U/L (ref 125–245)

## 2013-05-28 LAB — COMPREHENSIVE METABOLIC PANEL (CC13)
ALT: 10 U/L (ref 0–55)
ANION GAP: 10 meq/L (ref 3–11)
AST: 10 U/L (ref 5–34)
Albumin: 3 g/dL — ABNORMAL LOW (ref 3.5–5.0)
Alkaline Phosphatase: 100 U/L (ref 40–150)
BILIRUBIN TOTAL: 0.32 mg/dL (ref 0.20–1.20)
BUN: 33.2 mg/dL — ABNORMAL HIGH (ref 7.0–26.0)
CALCIUM: 9.2 mg/dL (ref 8.4–10.4)
CHLORIDE: 106 meq/L (ref 98–109)
CO2: 23 meq/L (ref 22–29)
CREATININE: 1.9 mg/dL — AB (ref 0.7–1.3)
Glucose: 218 mg/dl — ABNORMAL HIGH (ref 70–140)
Potassium: 3.7 mEq/L (ref 3.5–5.1)
Sodium: 138 mEq/L (ref 136–145)
Total Protein: 6.6 g/dL (ref 6.4–8.3)

## 2013-05-28 LAB — IRON AND TIBC CHCC
%SAT: 23 % (ref 20–55)
Iron: 48 ug/dL (ref 42–163)
TIBC: 211 ug/dL (ref 202–409)
UIBC: 163 ug/dL (ref 117–376)

## 2013-05-28 LAB — FERRITIN CHCC

## 2013-05-28 MED ORDER — DARBEPOETIN ALFA-POLYSORBATE 300 MCG/0.6ML IJ SOLN: 300.0000 ug | Freq: Once | INTRAMUSCULAR | Status: DC

## 2013-05-29 LAB — KAPPA/LAMBDA LIGHT CHAINS
KAPPA FREE LGHT CHN: 3.83 mg/dL — AB (ref 0.33–1.94)
Kappa:Lambda Ratio: 0.99 (ref 0.26–1.65)
Lambda Free Lght Chn: 3.86 mg/dL — ABNORMAL HIGH (ref 0.57–2.63)

## 2013-05-29 LAB — BETA 2 MICROGLOBULIN, SERUM: Beta-2 Microglobulin: 3.87 mg/L — ABNORMAL HIGH (ref 1.01–1.73)

## 2013-05-29 LAB — IGG, IGA, IGM
IgA: 205 mg/dL (ref 68–379)
IgG (Immunoglobin G), Serum: 965 mg/dL (ref 650–1600)
IgM, Serum: 183 mg/dL (ref 41–251)

## 2013-06-03 ENCOUNTER — Ambulatory Visit (HOSPITAL_BASED_OUTPATIENT_CLINIC_OR_DEPARTMENT_OTHER): Payer: Medicare Other | Admitting: Internal Medicine

## 2013-06-03 ENCOUNTER — Telehealth: Payer: Self-pay | Admitting: Internal Medicine

## 2013-06-03 ENCOUNTER — Encounter: Payer: Self-pay | Admitting: Internal Medicine

## 2013-06-03 VITALS — BP 185/88 | HR 86 | Temp 97.6°F | Resp 19 | Ht 71.0 in | Wt 252.7 lb

## 2013-06-03 DIAGNOSIS — D638 Anemia in other chronic diseases classified elsewhere: Secondary | ICD-10-CM

## 2013-06-03 DIAGNOSIS — D649 Anemia, unspecified: Secondary | ICD-10-CM

## 2013-06-03 DIAGNOSIS — D472 Monoclonal gammopathy: Secondary | ICD-10-CM

## 2013-06-03 DIAGNOSIS — R5381 Other malaise: Secondary | ICD-10-CM

## 2013-06-03 DIAGNOSIS — R5383 Other fatigue: Secondary | ICD-10-CM

## 2013-06-03 NOTE — Progress Notes (Signed)
[place] [phone]   [phone] OFFICE PROGRESS NOTE   DIAGNOSIS:  1) Anemia of chronic disease.  2) Monoclonal gammopathy of undetermined significance. 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 Janice Norrie.   PRIOR THERAPY: Feraheme Infusion last dose was given 12/05/2012   CURRENT THERAPY: Aranesp 300 mcg subcutaneously every 3 weeks.  INTERVAL HISTORY: Roger Wood 73 y.o. male returns to the clinic today for followup visit accompanied his wife. The patient is feeling fine today with no specific complaints except for mild fatigue. He denied having any chest pain, shortness of breath, cough or hemoptysis. He is tolerating his treatment with Aranesp 300 mcg subcutaneously every 3 weeks fairly well. He denied having any significant weight loss or night sweats. He denied having any nausea or vomiting or rectal bleeding. He has repeat myeloma panel in addition to iron study and ferritin performed recently and he is here for evaluation and discussion of his lab results.  MEDICAL HISTORY: Past Medical History  Diagnosis Date  . Hypertension   . Gout   . Arthritis   . Diabetes mellitus     adult onset   . History of radiation therapy 01/27/11-03/26/11    prostate  . Refusal of blood transfusions as patient is Jehovah's Witness   . Cataract     b/l catartact surgery  . Anemia     takes iron supplements  . Type II diabetes mellitus with nephropathy   . Prostate cancer 07/14/10    dx    ALLERGIES:  has No Known Allergies.  MEDICATIONS:  Current Outpatient Prescriptions  Medication Sig Dispense Refill  . amLODipine (NORVASC) 10 MG tablet Take 10 mg by mouth every morning.      . furosemide (LASIX) 40 MG tablet Take 40 mg by mouth daily as needed (For leg swelling.).      Marland Kitchen potassium chloride (K-DUR,KLOR-CON) 10 MEQ tablet Take 20 mEq by mouth daily as  needed (For potassium loss.).       Marland Kitchen thiamine 100 MG tablet Take 1 tablet (100 mg total) by mouth daily.  30 tablet  1   No current facility-administered medications for this visit.    REVIEW OF SYSTEMS:  Constitutional: positive for fatigue Eyes: negative Ears, nose, mouth, throat, and face: negative Respiratory: positive for dyspnea on exertion Cardiovascular: negative Gastrointestinal: negative Genitourinary:negative Integument/breast: negative Hematologic/lymphatic: negative Musculoskeletal:negative Neurological: negative Behavioral/Psych: negative Endocrine: negative Allergic/Immunologic: negative   PHYSICAL EXAMINATION: General appearance: alert, cooperative, fatigued and no distress Head: Normocephalic, without obvious abnormality, atraumatic Neck: no adenopathy, no JVD, supple, symmetrical, trachea midline and thyroid not enlarged, symmetric, no tenderness/mass/nodules Lymph nodes: Cervical, supraclavicular, and axillary nodes normal. Resp: clear to auscultation bilaterally Back: symmetric, no curvature. ROM normal. No CVA tenderness. Cardio: regular rate and rhythm, S1, S2 normal, no murmur, click, rub or gallop GI: soft, non-tender; bowel sounds normal; no masses,  no organomegaly Extremities: extremities normal, atraumatic, no cyanosis or edema  ECOG PERFORMANCE STATUS: 2 - Symptomatic, <50% confined to bed  Blood pressure 185/88, pulse 86, temperature 97.6 F (36.4 C), temperature source Oral, resp. rate 19, height 5\' 11"  (1.803 m), weight 252 lb 11.2 oz (114.624 kg), SpO2 100.00%.  LABORATORY DATA: Lab Results  Component Value Date   WBC 8.1 05/28/2013   HGB 11.3* 05/28/2013   HCT 34.1* 05/28/2013   MCV 78.3* 05/28/2013   PLT 247  05/28/2013      Chemistry      Component Value Date/Time   NA 138 05/28/2013 1109   NA 135 12/18/2012 0333   K 3.7 05/28/2013 1109   K 3.4* 12/18/2012 0333   CL 102 12/18/2012 0333   CL 106 01/29/2012 1520   CO2 23 05/28/2013 1109   CO2 21  12/18/2012 0333   BUN 33.2* 05/28/2013 1109   BUN 52* 12/18/2012 0333   CREATININE 1.9* 05/28/2013 1109   CREATININE 1.96* 12/18/2012 0333      Component Value Date/Time   CALCIUM 9.2 05/28/2013 1109   CALCIUM 8.7 12/18/2012 0333   ALKPHOS 100 05/28/2013 1109   ALKPHOS 141* 12/15/2012 0400   AST 10 05/28/2013 1109   AST 31 12/15/2012 0400   ALT 10 05/28/2013 1109   ALT 63* 12/15/2012 0400   BILITOT 0.32 05/28/2013 1109   BILITOT 0.2* 12/15/2012 0400       RADIOGRAPHIC STUDIES:   ASSESSMENT AND PLAN: This is a very pleasant 73 years old [place] American male with persistent anemia most likely secondary to anemia of chronic disease secondary to chronic renal insufficiency.   His myeloma panel and iron study performed recently were unremarkable. The patient continues to have persistent anemia and he is Jehovah's Witness. I recommended for him to continue treatment with Aranesp 300 mcg subcutaneously every 3 weeks for the anemia of chronic disease.  I would see him back for followup visit in 3 months with repeat CBC and iron study in addition to myeloma panel. He was advised to call immediately if he has any concerning symptoms in the interval. The patient voices understanding of current disease status and treatment options and is in agreement with the current care plan.  All questions were answered. The patient knows to call the clinic with any problems, questions or concerns. We can certainly see the patient much sooner if necessary.

## 2013-06-03 NOTE — Telephone Encounter (Signed)
Gave pt appt for for lab,md and injection un til May 2015

## 2013-06-24 ENCOUNTER — Other Ambulatory Visit (HOSPITAL_BASED_OUTPATIENT_CLINIC_OR_DEPARTMENT_OTHER): Payer: Medicare Other

## 2013-06-24 ENCOUNTER — Ambulatory Visit: Payer: Medicare Other

## 2013-06-24 DIAGNOSIS — N189 Chronic kidney disease, unspecified: Secondary | ICD-10-CM

## 2013-06-24 DIAGNOSIS — D638 Anemia in other chronic diseases classified elsewhere: Secondary | ICD-10-CM

## 2013-06-24 DIAGNOSIS — D649 Anemia, unspecified: Secondary | ICD-10-CM

## 2013-06-24 LAB — CBC WITH DIFFERENTIAL/PLATELET
BASO%: 0.4 % (ref 0.0–2.0)
Basophils Absolute: 0 10*3/uL (ref 0.0–0.1)
EOS%: 1.1 % (ref 0.0–7.0)
Eosinophils Absolute: 0.1 10*3/uL (ref 0.0–0.5)
HEMATOCRIT: 34.1 % — AB (ref 38.4–49.9)
HEMOGLOBIN: 11.3 g/dL — AB (ref 13.0–17.1)
LYMPH#: 1.2 10*3/uL (ref 0.9–3.3)
LYMPH%: 15.9 % (ref 14.0–49.0)
MCH: 26 pg — ABNORMAL LOW (ref 27.2–33.4)
MCHC: 33 g/dL (ref 32.0–36.0)
MCV: 78.6 fL — ABNORMAL LOW (ref 79.3–98.0)
MONO#: 0.8 10*3/uL (ref 0.1–0.9)
MONO%: 10.7 % (ref 0.0–14.0)
NEUT#: 5.3 10*3/uL (ref 1.5–6.5)
NEUT%: 71.9 % (ref 39.0–75.0)
Platelets: 201 10*3/uL (ref 140–400)
RBC: 4.34 10*6/uL (ref 4.20–5.82)
RDW: 18.5 % — ABNORMAL HIGH (ref 11.0–14.6)
WBC: 7.4 10*3/uL (ref 4.0–10.3)

## 2013-06-24 MED ORDER — DARBEPOETIN ALFA-POLYSORBATE 300 MCG/0.6ML IJ SOLN: 300.0000 ug | Freq: Once | INTRAMUSCULAR | Status: DC

## 2013-07-15 ENCOUNTER — Other Ambulatory Visit: Payer: Medicare Other

## 2013-07-15 ENCOUNTER — Ambulatory Visit: Payer: Medicare Other

## 2013-08-05 ENCOUNTER — Ambulatory Visit: Payer: Medicare Other

## 2013-08-05 ENCOUNTER — Other Ambulatory Visit (HOSPITAL_BASED_OUTPATIENT_CLINIC_OR_DEPARTMENT_OTHER): Payer: Medicare Other

## 2013-08-05 VITALS — BP 199/81 | HR 70 | Temp 97.7°F

## 2013-08-05 DIAGNOSIS — D649 Anemia, unspecified: Secondary | ICD-10-CM

## 2013-08-05 DIAGNOSIS — N189 Chronic kidney disease, unspecified: Secondary | ICD-10-CM

## 2013-08-05 LAB — CBC WITH DIFFERENTIAL/PLATELET
BASO%: 0.4 % (ref 0.0–2.0)
BASOS ABS: 0 10*3/uL (ref 0.0–0.1)
EOS ABS: 0.2 10*3/uL (ref 0.0–0.5)
EOS%: 1.9 % (ref 0.0–7.0)
HCT: 30.9 % — ABNORMAL LOW (ref 38.4–49.9)
HEMOGLOBIN: 10 g/dL — AB (ref 13.0–17.1)
LYMPH#: 1.2 10*3/uL (ref 0.9–3.3)
LYMPH%: 15 % (ref 14.0–49.0)
MCH: 25.9 pg — AB (ref 27.2–33.4)
MCHC: 32.4 g/dL (ref 32.0–36.0)
MCV: 79.9 fL (ref 79.3–98.0)
MONO#: 0.8 10*3/uL (ref 0.1–0.9)
MONO%: 9.3 % (ref 0.0–14.0)
NEUT%: 73.4 % (ref 39.0–75.0)
NEUTROS ABS: 6.1 10*3/uL (ref 1.5–6.5)
Platelets: 245 10*3/uL (ref 140–400)
RBC: 3.87 10*6/uL — ABNORMAL LOW (ref 4.20–5.82)
RDW: 17.6 % — AB (ref 11.0–14.6)
WBC: 8.3 10*3/uL (ref 4.0–10.3)

## 2013-08-05 MED ORDER — DARBEPOETIN ALFA-POLYSORBATE 300 MCG/0.6ML IJ SOLN: 300.0000 ug | Freq: Once | INTRAMUSCULAR | Status: DC

## 2013-08-05 NOTE — Progress Notes (Signed)
Roger Wood here for lab and possible Aranesp injection.   HGB 10.0 today.  He could receive Aranesp injection except that his BP is elevated  199/81-70   Called Dr Julien Nordmann who said that we shouldn't treat him with the elevated blood pressure.  Also that he needs to see his PCP or go to Urgent Care for his BP.  Patient states that he hasn't taken his BP medicine in 2-3 weeks because of being unable to afford it.  States that he will go get his medicine today and get back on it.  He is also eating too much salt on his foods.   He is scheduled to see Dr Julien Nordmann and get labs on May 5,2015.   I encouraged him to see his PCP and reduce the salt in his diet.  He states understanding of this.

## 2013-08-25 ENCOUNTER — Other Ambulatory Visit (HOSPITAL_BASED_OUTPATIENT_CLINIC_OR_DEPARTMENT_OTHER): Payer: Medicare Other

## 2013-08-25 ENCOUNTER — Telehealth: Payer: Self-pay | Admitting: *Deleted

## 2013-08-25 ENCOUNTER — Encounter: Payer: Self-pay | Admitting: Internal Medicine

## 2013-08-25 ENCOUNTER — Ambulatory Visit (HOSPITAL_BASED_OUTPATIENT_CLINIC_OR_DEPARTMENT_OTHER): Payer: Medicare Other

## 2013-08-25 ENCOUNTER — Ambulatory Visit (HOSPITAL_BASED_OUTPATIENT_CLINIC_OR_DEPARTMENT_OTHER): Payer: Medicare Other | Admitting: Internal Medicine

## 2013-08-25 VITALS — BP 178/64 | HR 81 | Temp 97.9°F | Resp 19 | Ht 71.0 in | Wt 252.1 lb

## 2013-08-25 DIAGNOSIS — D472 Monoclonal gammopathy: Secondary | ICD-10-CM

## 2013-08-25 DIAGNOSIS — D649 Anemia, unspecified: Secondary | ICD-10-CM

## 2013-08-25 DIAGNOSIS — N039 Chronic nephritic syndrome with unspecified morphologic changes: Secondary | ICD-10-CM

## 2013-08-25 DIAGNOSIS — Z8546 Personal history of malignant neoplasm of prostate: Secondary | ICD-10-CM | POA: Insufficient documentation

## 2013-08-25 DIAGNOSIS — N289 Disorder of kidney and ureter, unspecified: Secondary | ICD-10-CM

## 2013-08-25 DIAGNOSIS — D631 Anemia in chronic kidney disease: Secondary | ICD-10-CM

## 2013-08-25 DIAGNOSIS — D638 Anemia in other chronic diseases classified elsewhere: Secondary | ICD-10-CM

## 2013-08-25 DIAGNOSIS — N189 Chronic kidney disease, unspecified: Secondary | ICD-10-CM

## 2013-08-25 DIAGNOSIS — C61 Malignant neoplasm of prostate: Secondary | ICD-10-CM

## 2013-08-25 LAB — FERRITIN CHCC: Ferritin: 3693 ng/ml — ABNORMAL HIGH (ref 22–316)

## 2013-08-25 LAB — CBC WITH DIFFERENTIAL/PLATELET
BASO%: 0.7 % (ref 0.0–2.0)
Basophils Absolute: 0.1 10*3/uL (ref 0.0–0.1)
EOS%: 2.2 % (ref 0.0–7.0)
Eosinophils Absolute: 0.2 10*3/uL (ref 0.0–0.5)
HCT: 24.9 % — ABNORMAL LOW (ref 38.4–49.9)
HEMOGLOBIN: 8.2 g/dL — AB (ref 13.0–17.1)
LYMPH%: 7.5 % — ABNORMAL LOW (ref 14.0–49.0)
MCH: 26.1 pg — ABNORMAL LOW (ref 27.2–33.4)
MCHC: 32.7 g/dL (ref 32.0–36.0)
MCV: 79.6 fL (ref 79.3–98.0)
MONO#: 0.7 10*3/uL (ref 0.1–0.9)
MONO%: 7.5 % (ref 0.0–14.0)
NEUT#: 8.2 10*3/uL — ABNORMAL HIGH (ref 1.5–6.5)
NEUT%: 82.1 % — ABNORMAL HIGH (ref 39.0–75.0)
Platelets: 459 10*3/uL — ABNORMAL HIGH (ref 140–400)
RBC: 3.13 10*6/uL — ABNORMAL LOW (ref 4.20–5.82)
RDW: 16.1 % — ABNORMAL HIGH (ref 11.0–14.6)
WBC: 9.9 10*3/uL (ref 4.0–10.3)
lymph#: 0.7 10*3/uL — ABNORMAL LOW (ref 0.9–3.3)

## 2013-08-25 LAB — COMPREHENSIVE METABOLIC PANEL (CC13)
ALK PHOS: 132 U/L (ref 40–150)
ALT: 34 U/L (ref 0–55)
ANION GAP: 15 meq/L — AB (ref 3–11)
AST: 17 U/L (ref 5–34)
Albumin: 2.2 g/dL — ABNORMAL LOW (ref 3.5–5.0)
BUN: 62.5 mg/dL — AB (ref 7.0–26.0)
CHLORIDE: 101 meq/L (ref 98–109)
CO2: 20 mEq/L — ABNORMAL LOW (ref 22–29)
Calcium: 9.7 mg/dL (ref 8.4–10.4)
Creatinine: 3.5 mg/dL (ref 0.7–1.3)
Glucose: 276 mg/dl — ABNORMAL HIGH (ref 70–140)
Potassium: 4.7 mEq/L (ref 3.5–5.1)
Sodium: 136 mEq/L (ref 136–145)
Total Bilirubin: 0.2 mg/dL (ref 0.20–1.20)
Total Protein: 7.4 g/dL (ref 6.4–8.3)

## 2013-08-25 LAB — IRON AND TIBC CHCC
%SAT: 17 % — AB (ref 20–55)
IRON: 27 ug/dL — AB (ref 42–163)
TIBC: 159 ug/dL — ABNORMAL LOW (ref 202–409)
UIBC: 131 ug/dL (ref 117–376)

## 2013-08-25 LAB — LACTATE DEHYDROGENASE (CC13): LDH: 200 U/L (ref 125–245)

## 2013-08-25 MED ORDER — DARBEPOETIN ALFA-POLYSORBATE 300 MCG/0.6ML IJ SOLN
300.0000 ug | Freq: Once | INTRAMUSCULAR | Status: AC
  Administered 2013-08-25: 300 ug via SUBCUTANEOUS
  Filled 2013-08-25: qty 0.6

## 2013-08-25 NOTE — Progress Notes (Signed)
[place] [phone]   [phone] OFFICE PROGRESS NOTE   DIAGNOSIS:  1) Anemia of chronic disease.  2) Monoclonal gammopathy of undetermined significance. 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 Janice Norrie.   PRIOR THERAPY: Feraheme Infusion last dose was given 12/05/2012   CURRENT THERAPY: Aranesp 300 mcg subcutaneously every 3 weeks.  INTERVAL HISTORY: Roger Wood 73 y.o. male returns to the clinic today for followup visit accompanied by his wife. The patient is feeling fine today with no specific complaints except for mild fatigue. He has arthritis in the upper extremities recently which is improving slowly. He denied having any chest pain, shortness of breath, cough or hemoptysis. He is tolerating his treatment with Aranesp 300 mcg subcutaneously every 3 weeks fairly well. He denied having any significant weight loss or night sweats. He denied having any nausea or vomiting or rectal bleeding. He has repeat myeloma panel in addition to iron study and ferritin performed earlier today and he is here for evaluation and discussion of his lab results.  MEDICAL HISTORY: Past Medical History  Diagnosis Date  . Hypertension   . Gout   . Arthritis   . Diabetes mellitus     adult onset   . History of radiation therapy 01/27/11-03/26/11    prostate  . Refusal of blood transfusions as patient is Jehovah's Witness   . Cataract     b/l catartact surgery  . Anemia     takes iron supplements  . Type II diabetes mellitus with nephropathy   . Prostate cancer 07/14/10    dx    ALLERGIES:  has No Known Allergies.  MEDICATIONS:  Current Outpatient Prescriptions  Medication Sig Dispense Refill  . amLODipine (NORVASC) 10 MG tablet Take 10 mg by mouth every morning.      . furosemide (LASIX) 40 MG tablet Take 40 mg by mouth daily as needed (For leg swelling.).       Marland Kitchen potassium chloride (K-DUR,KLOR-CON) 10 MEQ tablet Take 20 mEq by mouth daily as needed (For potassium loss.).       Marland Kitchen thiamine 100 MG tablet Take 1 tablet (100 mg total) by mouth daily.  30 tablet  1   No current facility-administered medications for this visit.    REVIEW OF SYSTEMS:  Constitutional: positive for fatigue Eyes: negative Ears, nose, mouth, throat, and face: negative Respiratory: positive for dyspnea on exertion Cardiovascular: negative Gastrointestinal: negative Genitourinary:negative Integument/breast: negative Hematologic/lymphatic: negative Musculoskeletal:negative Neurological: negative Behavioral/Psych: negative Endocrine: negative Allergic/Immunologic: negative   PHYSICAL EXAMINATION: General appearance: alert, cooperative, fatigued and no distress Head: Normocephalic, without obvious abnormality, atraumatic Neck: no adenopathy, no JVD, supple, symmetrical, trachea midline and thyroid not enlarged, symmetric, no tenderness/mass/nodules Lymph nodes: Cervical, supraclavicular, and axillary nodes normal. Resp: clear to auscultation bilaterally Back: symmetric, no curvature. ROM normal. No CVA tenderness. Cardio: regular rate and rhythm, S1, S2 normal, no murmur, click, rub or gallop GI: soft, non-tender; bowel sounds normal; no masses,  no organomegaly Extremities: extremities normal, atraumatic, no cyanosis or edema  ECOG PERFORMANCE STATUS: 2 - Symptomatic, <50% confined to bed  Blood pressure 178/64, pulse 81, temperature 97.9 F (36.6 C), temperature source Oral, resp. rate 19, height 5\' 11"  (1.803 m), weight 252 lb 1.6 oz (114.352 kg), SpO2 100.00%.  LABORATORY DATA: Lab Results  Component Value Date   WBC 9.9 08/25/2013   HGB 8.2* 08/25/2013  HCT 24.9* 08/25/2013   MCV 79.6 08/25/2013   PLT 459* 08/25/2013      Chemistry      Component Value Date/Time   NA 138 05/28/2013 1109   NA 135 12/18/2012 0333   K 3.7 05/28/2013 1109   K 3.4* 12/18/2012 0333    CL 102 12/18/2012 0333   CL 106 01/29/2012 1520   CO2 23 05/28/2013 1109   CO2 21 12/18/2012 0333   BUN 33.2* 05/28/2013 1109   BUN 52* 12/18/2012 0333   CREATININE 1.9* 05/28/2013 1109   CREATININE 1.96* 12/18/2012 0333      Component Value Date/Time   CALCIUM 9.2 05/28/2013 1109   CALCIUM 8.7 12/18/2012 0333   ALKPHOS 100 05/28/2013 1109   ALKPHOS 141* 12/15/2012 0400   AST 10 05/28/2013 1109   AST 31 12/15/2012 0400   ALT 10 05/28/2013 1109   ALT 63* 12/15/2012 0400   BILITOT 0.32 05/28/2013 1109   BILITOT 0.2* 12/15/2012 0400       RADIOGRAPHIC STUDIES:   ASSESSMENT AND PLAN: This is a very pleasant 73 years old [place] American male with persistent anemia most likely secondary to anemia of chronic disease secondary to chronic renal insufficiency.   His myeloma panel and iron study performed are still pending. The patient continues to have persistent anemia and he is Jehov[employer]'s Witness. I recommended for him to continue treatment with Aranesp 300 mcg subcutaneously every 3 weeks for the anemia of chronic disease.  I would see him back for followup visit in 3 months with repeat CBC and iron study in addition to myeloma panel. Comprehensive metabolic panel today showed worsening renal function. Advised the patient to see a nephrologist for evaluation. He was advised to call immediately if he has any concerning symptoms in the interval. The patient voices understanding of current disease status and treatment options and is in agreement with the current care plan.  All questions were answered. The patient knows to call the clinic with any problems, questions or concerns. We can certainly see the patient much sooner if necessary.  Disclaimer: This note was dictated with voice recognition software. Similar sounding words can inadvertently be transcribed and may not be corrected upon review.

## 2013-08-25 NOTE — Telephone Encounter (Signed)
Cr 3.5.  Per dr Vista Mink, pt needs to see a nephrologist if he is not already seeing a nephrologist.  Dr Vista Mink made referral to a nephrologist, called and left a message with home # to call us back.  Attempted to call his cell phone, unable to leave message.  SLJ

## 2013-08-26 ENCOUNTER — Ambulatory Visit: Payer: Medicare Other

## 2013-08-26 ENCOUNTER — Telehealth: Payer: Self-pay | Admitting: *Deleted

## 2013-08-26 ENCOUNTER — Other Ambulatory Visit: Payer: Medicare Other

## 2013-08-26 NOTE — Telephone Encounter (Signed)
Spoke to pt's wife, she states he does not have a kidney doctor.  She thought at one point his PCP was going to make a referral but he does not have an appt yet.  Told her our schedulers will be working in making that referral and will call them with an appt.  She verbalized understanding.  SLJ

## 2013-08-26 NOTE — Telephone Encounter (Signed)
error 

## 2013-08-27 LAB — IGG, IGA, IGM
IGA: 208 mg/dL (ref 68–379)
IgG (Immunoglobin G), Serum: 924 mg/dL (ref 650–1600)
IgM, Serum: 175 mg/dL (ref 41–251)

## 2013-08-27 LAB — KAPPA/LAMBDA LIGHT CHAINS
Kappa free light chain: 9.11 mg/dL — ABNORMAL HIGH (ref 0.33–1.94)
Kappa:Lambda Ratio: 1.46 (ref 0.26–1.65)
Lambda Free Lght Chn: 6.25 mg/dL — ABNORMAL HIGH (ref 0.57–2.63)

## 2013-08-27 LAB — BETA 2 MICROGLOBULIN, SERUM: BETA 2 MICROGLOBULIN: 10.5 mg/L — AB (ref <=2.51)

## 2013-08-28 ENCOUNTER — Telehealth: Payer: Self-pay | Admitting: Internal Medicine

## 2013-08-28 NOTE — Telephone Encounter (Signed)
s.w. pt wife and advised on May appt....pt will come get new sched at nxt visit.Marland KitchenMarland Kitchen

## 2013-08-31 ENCOUNTER — Ambulatory Visit: Payer: Medicare Other | Admitting: Internal Medicine

## 2013-09-15 ENCOUNTER — Other Ambulatory Visit: Payer: Self-pay

## 2013-09-16 ENCOUNTER — Ambulatory Visit (HOSPITAL_BASED_OUTPATIENT_CLINIC_OR_DEPARTMENT_OTHER): Payer: Medicare Other

## 2013-09-16 ENCOUNTER — Other Ambulatory Visit (HOSPITAL_BASED_OUTPATIENT_CLINIC_OR_DEPARTMENT_OTHER): Payer: Medicare Other

## 2013-09-16 VITALS — BP 161/57 | HR 75 | Temp 98.2°F

## 2013-09-16 DIAGNOSIS — D649 Anemia, unspecified: Secondary | ICD-10-CM

## 2013-09-16 DIAGNOSIS — N189 Chronic kidney disease, unspecified: Secondary | ICD-10-CM

## 2013-09-16 DIAGNOSIS — N039 Chronic nephritic syndrome with unspecified morphologic changes: Secondary | ICD-10-CM

## 2013-09-16 DIAGNOSIS — D631 Anemia in chronic kidney disease: Secondary | ICD-10-CM

## 2013-09-16 LAB — CBC WITH DIFFERENTIAL/PLATELET
BASO%: 0.5 % (ref 0.0–2.0)
Basophils Absolute: 0 10*3/uL (ref 0.0–0.1)
EOS ABS: 0.1 10*3/uL (ref 0.0–0.5)
EOS%: 1.3 % (ref 0.0–7.0)
HEMATOCRIT: 28.2 % — AB (ref 38.4–49.9)
HGB: 9 g/dL — ABNORMAL LOW (ref 13.0–17.1)
LYMPH#: 0.7 10*3/uL — AB (ref 0.9–3.3)
LYMPH%: 8.8 % — ABNORMAL LOW (ref 14.0–49.0)
MCH: 25.7 pg — ABNORMAL LOW (ref 27.2–33.4)
MCHC: 32.1 g/dL (ref 32.0–36.0)
MCV: 80.2 fL (ref 79.3–98.0)
MONO#: 0.7 10*3/uL (ref 0.1–0.9)
MONO%: 8.3 % (ref 0.0–14.0)
NEUT#: 6.7 10*3/uL — ABNORMAL HIGH (ref 1.5–6.5)
NEUT%: 81.1 % — AB (ref 39.0–75.0)
Platelets: 275 10*3/uL (ref 140–400)
RBC: 3.51 10*6/uL — AB (ref 4.20–5.82)
RDW: 16.3 % — ABNORMAL HIGH (ref 11.0–14.6)
WBC: 8.3 10*3/uL (ref 4.0–10.3)

## 2013-09-16 MED ORDER — DARBEPOETIN ALFA-POLYSORBATE 300 MCG/0.6ML IJ SOLN
300.0000 ug | Freq: Once | INTRAMUSCULAR | Status: AC
  Administered 2013-09-16: 300 ug via SUBCUTANEOUS
  Filled 2013-09-16: qty 0.6

## 2013-09-16 NOTE — Patient Instructions (Signed)
Darbepoetin Alfa injection What is this medicine? DARBEPOETIN ALFA (dar be POE e tin AL fa) helps your body make more red blood cells. It is used to treat anemia caused by chronic kidney failure and chemotherapy. This medicine may be used for other purposes; ask your health care provider or pharmacist if you have questions. COMMON BRAND NAME(S): Aranesp What should I tell my health care provider before I take this medicine? They need to know if you have any of these conditions: -blood clotting disorders or history of blood clots -cancer patient not on chemotherapy -cystic fibrosis -heart disease, such as angina, heart failure, or a history of a heart attack -hemoglobin level of 12 g/dL or greater -high blood pressure -low levels of folate, iron, or vitamin B12 -seizures -an unusual or allergic reaction to darbepoetin, erythropoietin, albumin, hamster proteins, latex, other medicines, foods, dyes, or preservatives -pregnant or trying to get pregnant -breast-feeding How should I use this medicine? This medicine is for injection into a vein or under the skin. It is usually given by a health care professional in a hospital or clinic setting. If you get this medicine at home, you will be taught how to prepare and give this medicine. Do not shake the solution before you withdraw a dose. Use exactly as directed. Take your medicine at regular intervals. Do not take your medicine more often than directed. It is important that you put your used needles and syringes in a special sharps container. Do not put them in a trash can. If you do not have a sharps container, call your pharmacist or healthcare provider to get one. Talk to your pediatrician regarding the use of this medicine in children. While this medicine may be used in children as young as 1 year for selected conditions, precautions do apply. Overdosage: If you think you have taken too much of this medicine contact a poison control center or  emergency room at once. NOTE: This medicine is only for you. Do not share this medicine with others. What if I miss a dose? If you miss a dose, take it as soon as you can. If it is almost time for your next dose, take only that dose. Do not take double or extra doses. What may interact with this medicine? Do not take this medicine with any of the following medications: -epoetin alfa This list may not describe all possible interactions. Give your health care provider a list of all the medicines, herbs, non-prescription drugs, or dietary supplements you use. Also tell them if you smoke, drink alcohol, or use illegal drugs. Some items may interact with your medicine. What should I watch for while using this medicine? Visit your prescriber or health care professional for regular checks on your progress and for the needed blood tests and blood pressure measurements. It is especially important for the doctor to make sure your hemoglobin level is in the desired range, to limit the risk of potential side effects and to give you the best benefit. Keep all appointments for any recommended tests. Check your blood pressure as directed. Ask your doctor what your blood pressure should be and when you should contact him or her. As your body makes more red blood cells, you may need to take iron, folic acid, or vitamin B supplements. Ask your doctor or health care provider which products are right for you. If you have kidney disease continue dietary restrictions, even though this medication can make you feel better. Talk with your doctor or health   care professional about the foods you eat and the vitamins that you take. What side effects may I notice from receiving this medicine? Side effects that you should report to your doctor or health care professional as soon as possible: -allergic reactions like skin rash, itching or hives, swelling of the face, lips, or tongue -breathing problems -changes in vision -chest  pain -confusion, trouble speaking or understanding -feeling faint or lightheaded, falls -high blood pressure -muscle aches or pains -pain, swelling, warmth in the leg -rapid weight gain -severe headaches -sudden numbness or weakness of the face, arm or leg -trouble walking, dizziness, loss of balance or coordination -seizures (convulsions) -swelling of the ankles, feet, hands -unusually weak or tired Side effects that usually do not require medical attention (report to your doctor or health care professional if they continue or are bothersome): -diarrhea -fever, chills (flu-like symptoms) -headaches -nausea, vomiting -redness, stinging, or swelling at site where injected This list may not describe all possible side effects. Call your doctor for medical advice about side effects. You may report side effects to FDA at 1-800-FDA-1088. Where should I keep my medicine? Keep out of the reach of children. Store in a refrigerator between 2 and 8 degrees C (36 and 46 degrees F). Do not freeze. Do not shake. Throw away any unused portion if using a single-dose vial. Throw away any unused medicine after the expiration date. NOTE: This sheet is a summary. It may not cover all possible information. If you have questions about this medicine, talk to your doctor, pharmacist, or health care provider.  2014, [employer]. (2008-03-23 10:23:57)  

## 2013-10-07 ENCOUNTER — Other Ambulatory Visit (HOSPITAL_BASED_OUTPATIENT_CLINIC_OR_DEPARTMENT_OTHER): Payer: Medicare Other

## 2013-10-07 ENCOUNTER — Ambulatory Visit (HOSPITAL_BASED_OUTPATIENT_CLINIC_OR_DEPARTMENT_OTHER): Payer: Medicare Other

## 2013-10-07 VITALS — BP 174/90 | Temp 97.5°F

## 2013-10-07 DIAGNOSIS — D631 Anemia in chronic kidney disease: Secondary | ICD-10-CM

## 2013-10-07 DIAGNOSIS — D649 Anemia, unspecified: Secondary | ICD-10-CM

## 2013-10-07 DIAGNOSIS — N189 Chronic kidney disease, unspecified: Secondary | ICD-10-CM

## 2013-10-07 DIAGNOSIS — N039 Chronic nephritic syndrome with unspecified morphologic changes: Secondary | ICD-10-CM

## 2013-10-07 LAB — CBC WITH DIFFERENTIAL/PLATELET
BASO%: 0.9 % (ref 0.0–2.0)
BASOS ABS: 0.1 10*3/uL (ref 0.0–0.1)
EOS%: 1.1 % (ref 0.0–7.0)
Eosinophils Absolute: 0.1 10*3/uL (ref 0.0–0.5)
HEMATOCRIT: 29.5 % — AB (ref 38.4–49.9)
HEMOGLOBIN: 9.6 g/dL — AB (ref 13.0–17.1)
LYMPH%: 12.8 % — ABNORMAL LOW (ref 14.0–49.0)
MCH: 25.4 pg — ABNORMAL LOW (ref 27.2–33.4)
MCHC: 32.5 g/dL (ref 32.0–36.0)
MCV: 78.1 fL — ABNORMAL LOW (ref 79.3–98.0)
MONO#: 0.5 10*3/uL (ref 0.1–0.9)
MONO%: 6.5 % (ref 0.0–14.0)
NEUT#: 6.2 10*3/uL (ref 1.5–6.5)
NEUT%: 78.7 % — AB (ref 39.0–75.0)
Platelets: 408 10*3/uL — ABNORMAL HIGH (ref 140–400)
RBC: 3.78 10*6/uL — ABNORMAL LOW (ref 4.20–5.82)
RDW: 16.9 % — ABNORMAL HIGH (ref 11.0–14.6)
WBC: 7.9 10*3/uL (ref 4.0–10.3)
lymph#: 1 10*3/uL (ref 0.9–3.3)

## 2013-10-07 MED ORDER — DARBEPOETIN ALFA-POLYSORBATE 300 MCG/0.6ML IJ SOLN
300.0000 ug | Freq: Once | INTRAMUSCULAR | Status: AC
  Administered 2013-10-07: 300 ug via SUBCUTANEOUS
  Filled 2013-10-07: qty 0.6

## 2013-10-07 NOTE — Patient Instructions (Signed)
Darbepoetin Alfa injection What is this medicine? DARBEPOETIN ALFA (dar be POE e tin AL fa) helps your body make more red blood cells. It is used to treat anemia caused by chronic kidney failure and chemotherapy. This medicine may be used for other purposes; ask your health care Carrigan Delafuente or pharmacist if you have questions. COMMON BRAND NAME(S): Aranesp What should I tell my health care Evian Derringer before I take this medicine? They need to know if you have any of these conditions: -blood clotting disorders or history of blood clots -cancer patient not on chemotherapy -cystic fibrosis -heart disease, such as angina, heart failure, or a history of a heart attack -hemoglobin level of 12 g/dL or greater -high blood pressure -low levels of folate, iron, or vitamin B12 -seizures -an unusual or allergic reaction to darbepoetin, erythropoietin, albumin, hamster proteins, latex, other medicines, foods, dyes, or preservatives -pregnant or trying to get pregnant -breast-feeding How should I use this medicine? This medicine is for injection into a vein or under the skin. It is usually given by a health care professional in a hospital or clinic setting. If you get this medicine at home, you will be taught how to prepare and give this medicine. Do not shake the solution before you withdraw a dose. Use exactly as directed. Take your medicine at regular intervals. Do not take your medicine more often than directed. It is important that you put your used needles and syringes in a special sharps container. Do not put them in a trash can. If you do not have a sharps container, call your pharmacist or healthcare Valeska Haislip to get one. Talk to your pediatrician regarding the use of this medicine in children. While this medicine may be used in children as young as 1 year for selected conditions, precautions do apply. Overdosage: If you think you have taken too much of this medicine contact a poison control center or  emergency room at once. NOTE: This medicine is only for you. Do not share this medicine with others. What if I miss a dose? If you miss a dose, take it as soon as you can. If it is almost time for your next dose, take only that dose. Do not take double or extra doses. What may interact with this medicine? Do not take this medicine with any of the following medications: -epoetin alfa This list may not describe all possible interactions. Give your health care Llewellyn Schoenberger a list of all the medicines, herbs, non-prescription drugs, or dietary supplements you use. Also tell them if you smoke, drink alcohol, or use illegal drugs. Some items may interact with your medicine. What should I watch for while using this medicine? Visit your prescriber or health care professional for regular checks on your progress and for the needed blood tests and blood pressure measurements. It is especially important for the doctor to make sure your hemoglobin level is in the desired range, to limit the risk of potential side effects and to give you the best benefit. Keep all appointments for any recommended tests. Check your blood pressure as directed. Ask your doctor what your blood pressure should be and when you should contact him or her. As your body makes more red blood cells, you may need to take iron, folic acid, or vitamin B supplements. Ask your doctor or health care Darrel Baroni which products are right for you. If you have kidney disease continue dietary restrictions, even though this medication can make you feel better. Talk with your doctor or health   care professional about the foods you eat and the vitamins that you take. What side effects may I notice from receiving this medicine? Side effects that you should report to your doctor or health care professional as soon as possible: -allergic reactions like skin rash, itching or hives, swelling of the face, lips, or tongue -breathing problems -changes in vision -chest  pain -confusion, trouble speaking or understanding -feeling faint or lightheaded, falls -high blood pressure -muscle aches or pains -pain, swelling, warmth in the leg -rapid weight gain -severe headaches -sudden numbness or weakness of the face, arm or leg -trouble walking, dizziness, loss of balance or coordination -seizures (convulsions) -swelling of the ankles, feet, hands -unusually weak or tired Side effects that usually do not require medical attention (report to your doctor or health care professional if they continue or are bothersome): -diarrhea -fever, chills (flu-like symptoms) -headaches -nausea, vomiting -redness, stinging, or swelling at site where injected This list may not describe all possible side effects. Call your doctor for medical advice about side effects. You may report side effects to FDA at 1-800-FDA-1088. Where should I keep my medicine? Keep out of the reach of children. Store in a refrigerator between 2 and 8 degrees C (36 and 46 degrees F). Do not freeze. Do not shake. Throw away any unused portion if using a single-dose vial. Throw away any unused medicine after the expiration date. NOTE: This sheet is a summary. It may not cover all possible information. If you have questions about this medicine, talk to your doctor, pharmacist, or health care Gabrielly Mccrystal.  2014, Elsevier/Gold Standard. (2008-03-23 10:23:57)  

## 2013-10-28 ENCOUNTER — Ambulatory Visit (HOSPITAL_BASED_OUTPATIENT_CLINIC_OR_DEPARTMENT_OTHER): Payer: Medicare Other

## 2013-10-28 ENCOUNTER — Other Ambulatory Visit (HOSPITAL_BASED_OUTPATIENT_CLINIC_OR_DEPARTMENT_OTHER): Payer: Medicare Other

## 2013-10-28 VITALS — BP 179/68 | HR 85 | Temp 98.0°F

## 2013-10-28 DIAGNOSIS — N183 Chronic kidney disease, stage 3 (moderate): Secondary | ICD-10-CM

## 2013-10-28 DIAGNOSIS — N179 Acute kidney failure, unspecified: Secondary | ICD-10-CM

## 2013-10-28 DIAGNOSIS — D649 Anemia, unspecified: Secondary | ICD-10-CM

## 2013-10-28 DIAGNOSIS — N039 Chronic nephritic syndrome with unspecified morphologic changes: Secondary | ICD-10-CM

## 2013-10-28 DIAGNOSIS — N189 Chronic kidney disease, unspecified: Secondary | ICD-10-CM

## 2013-10-28 DIAGNOSIS — D631 Anemia in chronic kidney disease: Secondary | ICD-10-CM

## 2013-10-28 DIAGNOSIS — D638 Anemia in other chronic diseases classified elsewhere: Secondary | ICD-10-CM

## 2013-10-28 LAB — CBC WITH DIFFERENTIAL/PLATELET
BASO%: 0.6 % (ref 0.0–2.0)
BASOS ABS: 0 10*3/uL (ref 0.0–0.1)
EOS%: 1.6 % (ref 0.0–7.0)
Eosinophils Absolute: 0.1 10*3/uL (ref 0.0–0.5)
HEMATOCRIT: 32.3 % — AB (ref 38.4–49.9)
HEMOGLOBIN: 10.4 g/dL — AB (ref 13.0–17.1)
LYMPH%: 13.4 % — ABNORMAL LOW (ref 14.0–49.0)
MCH: 25.2 pg — AB (ref 27.2–33.4)
MCHC: 32.2 g/dL (ref 32.0–36.0)
MCV: 78.2 fL — AB (ref 79.3–98.0)
MONO#: 0.5 10*3/uL (ref 0.1–0.9)
MONO%: 6.5 % (ref 0.0–14.0)
NEUT#: 5.5 10*3/uL (ref 1.5–6.5)
NEUT%: 77.9 % — AB (ref 39.0–75.0)
PLATELETS: 283 10*3/uL (ref 140–400)
RBC: 4.13 10*6/uL — ABNORMAL LOW (ref 4.20–5.82)
RDW: 18.4 % — ABNORMAL HIGH (ref 11.0–14.6)
WBC: 7.1 10*3/uL (ref 4.0–10.3)
lymph#: 0.9 10*3/uL (ref 0.9–3.3)

## 2013-10-28 MED ORDER — DARBEPOETIN ALFA-POLYSORBATE 300 MCG/0.6ML IJ SOLN
300.0000 ug | Freq: Once | INTRAMUSCULAR | Status: AC
  Administered 2013-10-28: 300 ug via SUBCUTANEOUS
  Filled 2013-10-28: qty 0.6

## 2013-11-18 ENCOUNTER — Ambulatory Visit: Payer: Medicare Other

## 2013-11-18 ENCOUNTER — Telehealth: Payer: Self-pay | Admitting: *Deleted

## 2013-11-18 ENCOUNTER — Other Ambulatory Visit: Payer: Self-pay | Admitting: *Deleted

## 2013-11-18 ENCOUNTER — Other Ambulatory Visit: Payer: Medicare Other

## 2013-11-18 NOTE — Telephone Encounter (Signed)
Called patient and spoke with wife about missed lab and injection appointment.  Patient not feeling well and forgot appt.  Rescheduled appt for nest week 11/25/13 .

## 2013-11-23 ENCOUNTER — Other Ambulatory Visit: Payer: Self-pay

## 2013-11-25 ENCOUNTER — Other Ambulatory Visit (HOSPITAL_BASED_OUTPATIENT_CLINIC_OR_DEPARTMENT_OTHER): Payer: Medicare Other

## 2013-11-25 ENCOUNTER — Ambulatory Visit (HOSPITAL_BASED_OUTPATIENT_CLINIC_OR_DEPARTMENT_OTHER): Payer: Medicare Other

## 2013-11-25 VITALS — BP 179/61 | HR 63 | Temp 97.3°F

## 2013-11-25 DIAGNOSIS — N039 Chronic nephritic syndrome with unspecified morphologic changes: Secondary | ICD-10-CM

## 2013-11-25 DIAGNOSIS — D631 Anemia in chronic kidney disease: Secondary | ICD-10-CM

## 2013-11-25 DIAGNOSIS — D472 Monoclonal gammopathy: Secondary | ICD-10-CM

## 2013-11-25 DIAGNOSIS — N189 Chronic kidney disease, unspecified: Secondary | ICD-10-CM

## 2013-11-25 DIAGNOSIS — Z8546 Personal history of malignant neoplasm of prostate: Secondary | ICD-10-CM

## 2013-11-25 DIAGNOSIS — D649 Anemia, unspecified: Secondary | ICD-10-CM

## 2013-11-25 LAB — CBC WITH DIFFERENTIAL/PLATELET
BASO%: 0.6 % (ref 0.0–2.0)
BASOS ABS: 0.1 10*3/uL (ref 0.0–0.1)
EOS ABS: 0.2 10*3/uL (ref 0.0–0.5)
EOS%: 1.6 % (ref 0.0–7.0)
HCT: 32 % — ABNORMAL LOW (ref 38.4–49.9)
HEMOGLOBIN: 10 g/dL — AB (ref 13.0–17.1)
LYMPH#: 1.1 10*3/uL (ref 0.9–3.3)
LYMPH%: 11.6 % — ABNORMAL LOW (ref 14.0–49.0)
MCH: 23.8 pg — ABNORMAL LOW (ref 27.2–33.4)
MCHC: 31.2 g/dL — ABNORMAL LOW (ref 32.0–36.0)
MCV: 76.4 fL — ABNORMAL LOW (ref 79.3–98.0)
MONO#: 0.6 10*3/uL (ref 0.1–0.9)
MONO%: 6.4 % (ref 0.0–14.0)
NEUT#: 7.6 10*3/uL — ABNORMAL HIGH (ref 1.5–6.5)
NEUT%: 79.8 % — ABNORMAL HIGH (ref 39.0–75.0)
Platelets: 467 10*3/uL — ABNORMAL HIGH (ref 140–400)
RBC: 4.19 10*6/uL — AB (ref 4.20–5.82)
RDW: 19.4 % — AB (ref 11.0–14.6)
WBC: 9.5 10*3/uL (ref 4.0–10.3)

## 2013-11-25 LAB — COMPREHENSIVE METABOLIC PANEL (CC13)
ALT: 24 U/L (ref 0–55)
ANION GAP: 8 meq/L (ref 3–11)
AST: 14 U/L (ref 5–34)
Albumin: 3 g/dL — ABNORMAL LOW (ref 3.5–5.0)
Alkaline Phosphatase: 122 U/L (ref 40–150)
BILIRUBIN TOTAL: 0.3 mg/dL (ref 0.20–1.20)
BUN: 48.2 mg/dL — ABNORMAL HIGH (ref 7.0–26.0)
CALCIUM: 9.1 mg/dL (ref 8.4–10.4)
CO2: 18 mEq/L — ABNORMAL LOW (ref 22–29)
Chloride: 112 mEq/L — ABNORMAL HIGH (ref 98–109)
Creatinine: 2.4 mg/dL — ABNORMAL HIGH (ref 0.7–1.3)
Glucose: 148 mg/dl — ABNORMAL HIGH (ref 70–140)
Potassium: 5.5 mEq/L — ABNORMAL HIGH (ref 3.5–5.1)
Sodium: 137 mEq/L (ref 136–145)
Total Protein: 7.3 g/dL (ref 6.4–8.3)

## 2013-11-25 LAB — FERRITIN CHCC: Ferritin: 997 ng/ml — ABNORMAL HIGH (ref 22–316)

## 2013-11-25 LAB — LACTATE DEHYDROGENASE (CC13): LDH: 136 U/L (ref 125–245)

## 2013-11-25 LAB — IRON AND TIBC CHCC
%SAT: 33 % (ref 20–55)
Iron: 65 ug/dL (ref 42–163)
TIBC: 194 ug/dL — AB (ref 202–409)
UIBC: 129 ug/dL (ref 117–376)

## 2013-11-25 MED ORDER — DARBEPOETIN ALFA-POLYSORBATE 300 MCG/0.6ML IJ SOLN
300.0000 ug | Freq: Once | INTRAMUSCULAR | Status: AC
  Administered 2013-11-25: 300 ug via SUBCUTANEOUS
  Filled 2013-11-25: qty 0.6

## 2013-11-26 LAB — KAPPA/LAMBDA LIGHT CHAINS
KAPPA LAMBDA RATIO: 1.35 (ref 0.26–1.65)
Kappa free light chain: 7.33 mg/dL — ABNORMAL HIGH (ref 0.33–1.94)
LAMBDA FREE LGHT CHN: 5.41 mg/dL — AB (ref 0.57–2.63)

## 2013-11-26 LAB — IGG, IGA, IGM
IGA: 254 mg/dL (ref 68–379)
IGM, SERUM: 187 mg/dL (ref 41–251)
IgG (Immunoglobin G), Serum: 1270 mg/dL (ref 650–1600)

## 2013-12-09 ENCOUNTER — Ambulatory Visit: Payer: Medicare Other | Admitting: Internal Medicine

## 2013-12-09 ENCOUNTER — Other Ambulatory Visit: Payer: Medicare Other

## 2013-12-09 ENCOUNTER — Ambulatory Visit: Payer: Medicare Other

## 2013-12-21 ENCOUNTER — Telehealth: Payer: Self-pay | Admitting: Internal Medicine

## 2013-12-21 NOTE — Telephone Encounter (Signed)
s.w. pt and r.s missed appt advised on Sept appt....ok adn aware

## 2013-12-22 ENCOUNTER — Telehealth: Payer: Self-pay | Admitting: Internal Medicine

## 2013-12-22 ENCOUNTER — Other Ambulatory Visit: Payer: Self-pay | Admitting: Physician Assistant

## 2013-12-22 NOTE — Telephone Encounter (Signed)
s.w. pt and advised on appt time change per Aj request...ok adn aware

## 2013-12-23 ENCOUNTER — Other Ambulatory Visit (HOSPITAL_BASED_OUTPATIENT_CLINIC_OR_DEPARTMENT_OTHER): Payer: Medicare Other

## 2013-12-23 ENCOUNTER — Other Ambulatory Visit: Payer: Medicare Other

## 2013-12-23 ENCOUNTER — Ambulatory Visit: Payer: Medicare Other | Admitting: Physician Assistant

## 2013-12-23 ENCOUNTER — Telehealth: Payer: Self-pay | Admitting: Physician Assistant

## 2013-12-23 ENCOUNTER — Ambulatory Visit (HOSPITAL_BASED_OUTPATIENT_CLINIC_OR_DEPARTMENT_OTHER): Payer: Medicare Other

## 2013-12-23 ENCOUNTER — Encounter: Payer: Self-pay | Admitting: Physician Assistant

## 2013-12-23 ENCOUNTER — Ambulatory Visit: Payer: Medicare Other

## 2013-12-23 ENCOUNTER — Ambulatory Visit (HOSPITAL_BASED_OUTPATIENT_CLINIC_OR_DEPARTMENT_OTHER): Payer: Medicare Other | Admitting: Physician Assistant

## 2013-12-23 VITALS — BP 190/81 | HR 87 | Temp 98.3°F | Resp 18 | Ht 71.0 in | Wt 246.3 lb

## 2013-12-23 DIAGNOSIS — I10 Essential (primary) hypertension: Secondary | ICD-10-CM

## 2013-12-23 DIAGNOSIS — D472 Monoclonal gammopathy: Secondary | ICD-10-CM

## 2013-12-23 DIAGNOSIS — N189 Chronic kidney disease, unspecified: Secondary | ICD-10-CM

## 2013-12-23 DIAGNOSIS — D631 Anemia in chronic kidney disease: Secondary | ICD-10-CM

## 2013-12-23 DIAGNOSIS — D649 Anemia, unspecified: Secondary | ICD-10-CM

## 2013-12-23 DIAGNOSIS — D638 Anemia in other chronic diseases classified elsewhere: Secondary | ICD-10-CM

## 2013-12-23 DIAGNOSIS — N039 Chronic nephritic syndrome with unspecified morphologic changes: Secondary | ICD-10-CM

## 2013-12-23 DIAGNOSIS — Z23 Encounter for immunization: Secondary | ICD-10-CM

## 2013-12-23 DIAGNOSIS — I159 Secondary hypertension, unspecified: Secondary | ICD-10-CM

## 2013-12-23 DIAGNOSIS — N183 Chronic kidney disease, stage 3 (moderate): Secondary | ICD-10-CM

## 2013-12-23 LAB — CBC WITH DIFFERENTIAL/PLATELET
BASO%: 0.7 % (ref 0.0–2.0)
BASOS ABS: 0.1 10*3/uL (ref 0.0–0.1)
EOS ABS: 0.1 10*3/uL (ref 0.0–0.5)
EOS%: 1.3 % (ref 0.0–7.0)
HEMATOCRIT: 28.7 % — AB (ref 38.4–49.9)
HEMOGLOBIN: 9.1 g/dL — AB (ref 13.0–17.1)
LYMPH%: 11.7 % — AB (ref 14.0–49.0)
MCH: 23.9 pg — ABNORMAL LOW (ref 27.2–33.4)
MCHC: 31.9 g/dL — ABNORMAL LOW (ref 32.0–36.0)
MCV: 75.1 fL — AB (ref 79.3–98.0)
MONO#: 0.7 10*3/uL (ref 0.1–0.9)
MONO%: 7.7 % (ref 0.0–14.0)
NEUT%: 78.6 % — AB (ref 39.0–75.0)
NEUTROS ABS: 6.9 10*3/uL — AB (ref 1.5–6.5)
PLATELETS: 361 10*3/uL (ref 140–400)
RBC: 3.82 10*6/uL — ABNORMAL LOW (ref 4.20–5.82)
RDW: 20.3 % — ABNORMAL HIGH (ref 11.0–14.6)
WBC: 8.8 10*3/uL (ref 4.0–10.3)
lymph#: 1 10*3/uL (ref 0.9–3.3)

## 2013-12-23 MED ORDER — INFLUENZA VAC SPLIT QUAD 0.5 ML IM SUSY
0.5000 mL | PREFILLED_SYRINGE | Freq: Once | INTRAMUSCULAR | Status: DC
  Filled 2013-12-23: qty 0.5

## 2013-12-23 MED ORDER — DARBEPOETIN ALFA-POLYSORBATE 300 MCG/0.6ML IJ SOLN
300.0000 ug | Freq: Once | INTRAMUSCULAR | Status: AC
  Administered 2013-12-23: 300 ug via SUBCUTANEOUS
  Filled 2013-12-23: qty 0.6

## 2013-12-23 NOTE — Assessment & Plan Note (Signed)
His recent protein studies revealed relatively stable disease. We'll plan to continue him on observation and have him return in 3 months with repeat protein studies to reevaluate his disease.

## 2013-12-23 NOTE — Telephone Encounter (Signed)
Pt confirmed labs/ov/inj per 09/02 POF, sent neurophathy referral to [facility], gave pt AVS....KJ

## 2013-12-23 NOTE — Assessment & Plan Note (Addendum)
His anemia is relatively stable we'll plan to continue Aranesp 300 mcg subcutaneously every 3 weeks as indicated. We will repeat ferritin and iron studies when he returns in 3 months

## 2013-12-23 NOTE — Patient Instructions (Signed)
Your anemia and your monoclonal gammopathy of unknown significance are both relatively stable. Continue with your Aranesp injections every 3 weeks as indicated Followup in 3 months with repeat protein studies and iron studies Take your blood pressure medications as prescribed and followup with your primary care physician as scheduled

## 2013-12-23 NOTE — Progress Notes (Addendum)
[place] Telephone:([phone]   Fax:([phone]) [phone]  SHARED VISIT PROGRESS NOTE   DIAGNOSIS:  1) Anemia of chronic disease.  2) Monoclonal gammopathy of undetermined significance. 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 Janice Norrie.   PRIOR THERAPY: Feraheme Infusion last dose was given 12/05/2012   CURRENT THERAPY: Aranesp 300 mcg subcutaneously every 3 weeks.  INTERVAL HISTORY: Roger Wood 73 y.o. male returns to the clinic today for followup visit accompanied by his wife. The patient is feeling fine today with no specific complaints except for mild fatigue. He has not had any bleeding or bruising. He denied having any chest pain, shortness of breath, cough or hemoptysis. He is tolerating his treatment with Aranesp 300 mcg subcutaneously every 3 weeks fairly well. He denied having any significant weight loss or night sweats. He denied having any nausea or vomiting or rectal bleeding. He has repeat myeloma panel in addition to iron study and ferritin performed earlier today and he is here for evaluation and discussion of his lab results. His systolic blood pressure is elevated today at 786 with a diastolic blood pressure 78. He's had some elevated readings in our office in the past. He admits to not taking his blood pressure medications on a regular basis. His primary care physician prescribes his blood pressure medications. He is due for followup with his primary care physician next week.  MEDICAL HISTORY: Past Medical History  Diagnosis Date  . Hypertension   . Gout   . Arthritis   . Diabetes mellitus     adult onset   . History of radiation therapy 01/27/11-03/26/11    prostate  . Refusal of blood transfusions as patient is Jehovah's Witness   . Cataract     b/l catartact surgery  . Anemia     takes iron supplements  . Type II diabetes mellitus with nephropathy   .  Prostate cancer 07/14/10    dx    ALLERGIES:  has No Known Allergies.  MEDICATIONS:  Current Outpatient Prescriptions  Medication Sig Dispense Refill  . amLODipine (NORVASC) 10 MG tablet Take 10 mg by mouth every morning.      . furosemide (LASIX) 40 MG tablet Take 40 mg by mouth daily as needed (For leg swelling.).      Marland Kitchen pravastatin (PRAVACHOL) 40 MG tablet Take 40 mg by mouth.      . potassium chloride (K-DUR,KLOR-CON) 10 MEQ tablet Take 20 mEq by mouth daily as needed (For potassium loss.).       Marland Kitchen thiamine 100 MG tablet Take 1 tablet (100 mg total) by mouth daily.  30 tablet  1   No current facility-administered medications for this visit.   Facility-Administered Medications Ordered in Other Visits  Medication Dose Route Frequency Provider Last Rate Last Dose  . Influenza vac split quadrivalent PF (FLUARIX) injection 0.5 mL  0.5 mL Intramuscular Once Curt Bears, MD        REVIEW OF SYSTEMS:  Constitutional: positive for fatigue Eyes: negative Ears, nose, mouth, throat, and face: negative Respiratory: positive for dyspnea on exertion Cardiovascular: negative Gastrointestinal: negative Genitourinary:negative Integument/breast: negative Hematologic/lymphatic: negative Musculoskeletal:negative Neurological: negative Behavioral/Psych: negative Endocrine: negative Allergic/Immunologic: negative   PHYSICAL EXAMINATION: General appearance: alert, cooperative, fatigued and no distress Head: Normocephalic, without obvious abnormality, atraumatic Neck: no adenopathy, no JVD, supple, symmetrical, trachea midline and thyroid not enlarged, symmetric, no tenderness/mass/nodules Lymph  nodes: Cervical, supraclavicular, and axillary nodes normal. Resp: clear to auscultation bilaterally Back: symmetric, no curvature. ROM normal. No CVA tenderness. Cardio: regular rate and rhythm, S1, S2 normal, no murmur, click, rub or gallop GI: soft, non-tender; bowel sounds normal; no masses,  no  organomegaly Extremities: extremities normal, atraumatic, no cyanosis or edema  ECOG PERFORMANCE STATUS: 2 - Symptomatic, <50% confined to bed  Blood pressure 190/81, pulse 87, temperature 98.3 F (36.8 C), temperature source Oral, resp. rate 18, height 5\' 11"  (1.803 m), weight 246 lb 4.8 oz (111.721 kg).  LABORATORY DATA: Lab Results  Component Value Date   WBC 8.8 12/23/2013   HGB 9.1* 12/23/2013   HCT 28.7* 12/23/2013   MCV 75.1* 12/23/2013   PLT 361 12/23/2013      Chemistry      Component Value Date/Time   NA 137 11/25/2013 1316   NA 135 12/18/2012 0333   K 5.5* 11/25/2013 1316   K 3.4* 12/18/2012 0333   CL 102 12/18/2012 0333   CL 106 01/29/2012 1520   CO2 18* 11/25/2013 1316   CO2 21 12/18/2012 0333   BUN 48.2* 11/25/2013 1316   BUN 52* 12/18/2012 0333   CREATININE 2.4* 11/25/2013 1316   CREATININE 1.96* 12/18/2012 0333      Component Value Date/Time   CALCIUM 9.1 11/25/2013 1316   CALCIUM 8.7 12/18/2012 0333   ALKPHOS 122 11/25/2013 1316   ALKPHOS 141* 12/15/2012 0400   AST 14 11/25/2013 1316   AST 31 12/15/2012 0400   ALT 24 11/25/2013 1316   ALT 63* 12/15/2012 0400   BILITOT 0.30 11/25/2013 1316   BILITOT 0.2* 12/15/2012 0400       RADIOGRAPHIC STUDIES:   ASSESSMENT AND PLAN: This is a very pleasant 73 years old [place] American male with persistent anemia most likely secondary to anemia of chronic disease secondary to chronic renal insufficiency.   His myeloma panel and iron study performed reveal stable disease. Patient was discussed with also seen by Dr. Julien Nordmann.  The patient continues to have persistent anemia and he is Jehovah's Witness. He will continue treatment with Aranesp 300 mcg subcutaneously every 3 weeks for the anemia of chronic disease.  He'll followup in in 3 months with repeat CBC and iron study in addition to myeloma panel. Patient was allowed [place] to take his blood pressure medications as prescribed. He was educated that uncontrolled hypertension and could worsen his kidney  function as well as potentially cause heart attacks and or strokes. Patient voiced understanding and will resume his blood pressure medications as well as followup with his primary care physician as scheduled for reevaluation of his blood pressure management and a blood pressure medications. He was advised to call immediately if he has any concerning symptoms in the interval. The patient voices understanding of current disease status and treatment options and is in agreement with the current care plan.  All questions were answered. The patient knows to call the clinic with any problems, questions or concerns. We can certainly see the patient much sooner if necessary.  Anemia of other chronic disease His anemia is relatively stable we'll plan to continue Aranesp 300 mcg subcutaneously every 3 weeks as indicated. We will repeat ferritin and iron studies when he returns in 3 months  Hypertension A systolic readings are significantly elevated at 193 and had been in this range over the past few months. His diastolic reading was 78 today Patient admits to not taking his blood pressure medication on a regular  basis. He was advised to take his medication as prescribed and followup with his primary care physician regarding his blood pressure control and blood pressure medications as scheduled. Patient was cautioned that uncontrolled hypertension could lead to worsening of his kidney disease as well as heart attacks and/or strokes. Patient and his wife report that he has a followup appointment next week.  IgG monoclonal gammopathy His recent protein studies revealed relatively stable disease. We'll plan to continue him on observation and have him return in 3 months with repeat protein studies to reevaluate his disease.   HEMAN, QUE E 12/23/2013  ADDENDUM: Hematology/Oncology Attending: I had a face to face encounter with the patient today. I recommended his care plan. This is a very pleasant 73 years  old [place] American male with anemia of chronic disease secondary to renal insufficiency as well as monoclonal gammopathy. He is currently on treatment with Aranesp 300 mcg subcutaneously every 3 weeks. He is tolerating his treatment well with no significant adverse effects. His anemia parameter still showing low hemoglobin and hematocrit but his iron study is unremarkable. Myeloma panel today showed no significant progression of his disease. I recommended for the patient to continue his current treatment with Aranesp every 3 weeks. He would come back for followup visit in 3 months with repeat CBC, iron study as well as myeloma panel. He was advised to call immediately if he has any concerning symptoms in the interval.  Disclaimer: This note was dictated with voice recognition software. Similar sounding words can inadvertently be transcribed and may not be corrected upon review. Eilleen Kempf., MD 12/23/2013

## 2013-12-23 NOTE — Assessment & Plan Note (Signed)
A systolic readings are significantly elevated at 193 and had been in this range over the past few months. His diastolic reading was 78 today Patient admits to not taking his blood pressure medication on a regular basis. He was advised to take his medication as prescribed and followup with his primary care physician regarding his blood pressure control and blood pressure medications as scheduled. Patient was cautioned that uncontrolled hypertension could lead to worsening of his kidney disease as well as heart attacks and/or strokes. Patient and his wife report that he has a followup appointment next week.

## 2013-12-30 ENCOUNTER — Telehealth: Payer: Self-pay | Admitting: Internal Medicine

## 2013-12-30 NOTE — Telephone Encounter (Signed)
Faxed medical records to [facility] @ [phone] pages)

## 2014-01-13 ENCOUNTER — Other Ambulatory Visit (HOSPITAL_BASED_OUTPATIENT_CLINIC_OR_DEPARTMENT_OTHER): Payer: Medicare Other

## 2014-01-13 ENCOUNTER — Ambulatory Visit: Payer: Medicare Other

## 2014-01-13 VITALS — BP 184/74 | HR 75 | Temp 97.8°F

## 2014-01-13 DIAGNOSIS — D638 Anemia in other chronic diseases classified elsewhere: Secondary | ICD-10-CM

## 2014-01-13 DIAGNOSIS — D649 Anemia, unspecified: Secondary | ICD-10-CM

## 2014-01-13 DIAGNOSIS — N183 Chronic kidney disease, stage 3 (moderate): Secondary | ICD-10-CM

## 2014-01-13 DIAGNOSIS — N039 Chronic nephritic syndrome with unspecified morphologic changes: Secondary | ICD-10-CM

## 2014-01-13 DIAGNOSIS — D631 Anemia in chronic kidney disease: Secondary | ICD-10-CM

## 2014-01-13 DIAGNOSIS — N189 Chronic kidney disease, unspecified: Secondary | ICD-10-CM

## 2014-01-13 LAB — CBC WITH DIFFERENTIAL/PLATELET
BASO%: 0.7 % (ref 0.0–2.0)
BASOS ABS: 0.1 10*3/uL (ref 0.0–0.1)
EOS%: 2.4 % (ref 0.0–7.0)
Eosinophils Absolute: 0.2 10*3/uL (ref 0.0–0.5)
HEMATOCRIT: 32.2 % — AB (ref 38.4–49.9)
HEMOGLOBIN: 10.2 g/dL — AB (ref 13.0–17.1)
LYMPH%: 16.3 % (ref 14.0–49.0)
MCH: 24.2 pg — AB (ref 27.2–33.4)
MCHC: 31.7 g/dL — ABNORMAL LOW (ref 32.0–36.0)
MCV: 76.4 fL — ABNORMAL LOW (ref 79.3–98.0)
MONO#: 0.6 10*3/uL (ref 0.1–0.9)
MONO%: 7.5 % (ref 0.0–14.0)
NEUT#: 5.5 10*3/uL (ref 1.5–6.5)
NEUT%: 73.1 % (ref 39.0–75.0)
Platelets: 273 10*3/uL (ref 140–400)
RBC: 4.22 10*6/uL (ref 4.20–5.82)
RDW: 21 % — ABNORMAL HIGH (ref 11.0–14.6)
WBC: 7.5 10*3/uL (ref 4.0–10.3)
lymph#: 1.2 10*3/uL (ref 0.9–3.3)

## 2014-01-13 MED ORDER — DARBEPOETIN ALFA-POLYSORBATE 300 MCG/0.6ML IJ SOLN
300.0000 ug | Freq: Once | INTRAMUSCULAR | Status: DC
  Filled 2014-01-13: qty 0.6

## 2014-01-13 NOTE — Progress Notes (Signed)
Roger Wood here for Aranesp injection.  BP 184/74 checked twice.  Notified Dr Julien Nordmann of this.  Instructed to not give injection and Roger Wood needs to go to his primary MD to get his blood pressure under control.   Patient and his wife aware of this and state understanding.

## 2014-02-03 ENCOUNTER — Other Ambulatory Visit (HOSPITAL_BASED_OUTPATIENT_CLINIC_OR_DEPARTMENT_OTHER): Payer: Medicare Other

## 2014-02-03 ENCOUNTER — Ambulatory Visit (HOSPITAL_BASED_OUTPATIENT_CLINIC_OR_DEPARTMENT_OTHER): Payer: Medicare Other

## 2014-02-03 VITALS — BP 168/75 | HR 84 | Temp 98.1°F

## 2014-02-03 DIAGNOSIS — D649 Anemia, unspecified: Secondary | ICD-10-CM

## 2014-02-03 DIAGNOSIS — D631 Anemia in chronic kidney disease: Secondary | ICD-10-CM

## 2014-02-03 DIAGNOSIS — N189 Chronic kidney disease, unspecified: Secondary | ICD-10-CM

## 2014-02-03 LAB — CBC WITH DIFFERENTIAL/PLATELET
BASO%: 0.5 % (ref 0.0–2.0)
Basophils Absolute: 0 10*3/uL (ref 0.0–0.1)
EOS%: 1.4 % (ref 0.0–7.0)
Eosinophils Absolute: 0.1 10*3/uL (ref 0.0–0.5)
HEMATOCRIT: 31.1 % — AB (ref 38.4–49.9)
HGB: 10 g/dL — ABNORMAL LOW (ref 13.0–17.1)
LYMPH%: 12.6 % — AB (ref 14.0–49.0)
MCH: 24.2 pg — AB (ref 27.2–33.4)
MCHC: 32 g/dL (ref 32.0–36.0)
MCV: 75.7 fL — AB (ref 79.3–98.0)
MONO#: 0.7 10*3/uL (ref 0.1–0.9)
MONO%: 8.2 % (ref 0.0–14.0)
NEUT#: 6.8 10*3/uL — ABNORMAL HIGH (ref 1.5–6.5)
NEUT%: 77.3 % — AB (ref 39.0–75.0)
PLATELETS: 252 10*3/uL (ref 140–400)
RBC: 4.11 10*6/uL — AB (ref 4.20–5.82)
RDW: 20.5 % — ABNORMAL HIGH (ref 11.0–14.6)
WBC: 8.8 10*3/uL (ref 4.0–10.3)
lymph#: 1.1 10*3/uL (ref 0.9–3.3)

## 2014-02-03 MED ORDER — DARBEPOETIN ALFA-POLYSORBATE 300 MCG/0.6ML IJ SOLN
300.0000 ug | Freq: Once | INTRAMUSCULAR | Status: AC
  Administered 2014-02-03: 300 ug via SUBCUTANEOUS
  Filled 2014-02-03: qty 0.6

## 2014-02-03 NOTE — Patient Instructions (Signed)
Darbepoetin Alfa injection What is this medicine? DARBEPOETIN ALFA (dar be POE e tin AL fa) helps your body make more red blood cells. It is used to treat anemia caused by chronic kidney failure and chemotherapy. This medicine may be used for other purposes; ask your health care provider or pharmacist if you have questions. COMMON BRAND NAME(S): Aranesp What should I tell my health care provider before I take this medicine? They need to know if you have any of these conditions: -blood clotting disorders or history of blood clots -cancer patient not on chemotherapy -cystic fibrosis -heart disease, such as angina, heart failure, or a history of a heart attack -hemoglobin level of 12 g/dL or greater -high blood pressure -low levels of folate, iron, or vitamin B12 -seizures -an unusual or allergic reaction to darbepoetin, erythropoietin, albumin, hamster proteins, latex, other medicines, foods, dyes, or preservatives -pregnant or trying to get pregnant -breast-feeding How should I use this medicine? This medicine is for injection into a vein or under the skin. It is usually given by a health care professional in a hospital or clinic setting. If you get this medicine at home, you will be taught how to prepare and give this medicine. Do not shake the solution before you withdraw a dose. Use exactly as directed. Take your medicine at regular intervals. Do not take your medicine more often than directed. It is important that you put your used needles and syringes in a special sharps container. Do not put them in a trash can. If you do not have a sharps container, call your pharmacist or healthcare provider to get one. Talk to your pediatrician regarding the use of this medicine in children. While this medicine may be used in children as young as 1 year for selected conditions, precautions do apply. Overdosage: If you think you have taken too much of this medicine contact a poison control center or  emergency room at once. NOTE: This medicine is only for you. Do not share this medicine with others. What if I miss a dose? If you miss a dose, take it as soon as you can. If it is almost time for your next dose, take only that dose. Do not take double or extra doses. What may interact with this medicine? Do not take this medicine with any of the following medications: -epoetin alfa This list may not describe all possible interactions. Give your health care provider a list of all the medicines, herbs, non-prescription drugs, or dietary supplements you use. Also tell them if you smoke, drink alcohol, or use illegal drugs. Some items may interact with your medicine. What should I watch for while using this medicine? Visit your prescriber or health care professional for regular checks on your progress and for the needed blood tests and blood pressure measurements. It is especially important for the doctor to make sure your hemoglobin level is in the desired range, to limit the risk of potential side effects and to give you the best benefit. Keep all appointments for any recommended tests. Check your blood pressure as directed. Ask your doctor what your blood pressure should be and when you should contact him or her. As your body makes more red blood cells, you may need to take iron, folic acid, or vitamin B supplements. Ask your doctor or health care provider which products are right for you. If you have kidney disease continue dietary restrictions, even though this medication can make you feel better. Talk with your doctor or health   care professional about the foods you eat and the vitamins that you take. What side effects may I notice from receiving this medicine? Side effects that you should report to your doctor or health care professional as soon as possible: -allergic reactions like skin rash, itching or hives, swelling of the face, lips, or tongue -breathing problems -changes in vision -chest  pain -confusion, trouble speaking or understanding -feeling faint or lightheaded, falls -high blood pressure -muscle aches or pains -pain, swelling, warmth in the leg -rapid weight gain -severe headaches -sudden numbness or weakness of the face, arm or leg -trouble walking, dizziness, loss of balance or coordination -seizures (convulsions) -swelling of the ankles, feet, hands -unusually weak or tired Side effects that usually do not require medical attention (report to your doctor or health care professional if they continue or are bothersome): -diarrhea -fever, chills (flu-like symptoms) -headaches -nausea, vomiting -redness, stinging, or swelling at site where injected This list may not describe all possible side effects. Call your doctor for medical advice about side effects. You may report side effects to FDA at 1-800-FDA-1088. Where should I keep my medicine? Keep out of the reach of children. Store in a refrigerator between 2 and 8 degrees C (36 and 46 degrees F). Do not freeze. Do not shake. Throw away any unused portion if using a single-dose vial. Throw away any unused medicine after the expiration date. NOTE: This sheet is a summary. It may not cover all possible information. If you have questions about this medicine, talk to your doctor, pharmacist, or health care provider.  2015, Elsevier/[employer] Standard. (2008-03-23 10:23:57)  

## 2014-02-24 ENCOUNTER — Other Ambulatory Visit (HOSPITAL_BASED_OUTPATIENT_CLINIC_OR_DEPARTMENT_OTHER): Payer: Medicare Other

## 2014-02-24 ENCOUNTER — Ambulatory Visit: Payer: Medicare Other

## 2014-02-24 DIAGNOSIS — N189 Chronic kidney disease, unspecified: Secondary | ICD-10-CM

## 2014-02-24 DIAGNOSIS — D649 Anemia, unspecified: Secondary | ICD-10-CM

## 2014-02-24 LAB — CBC WITH DIFFERENTIAL/PLATELET
BASO%: 0.6 % (ref 0.0–2.0)
Basophils Absolute: 0 10*3/uL (ref 0.0–0.1)
EOS%: 1.6 % (ref 0.0–7.0)
Eosinophils Absolute: 0.1 10*3/uL (ref 0.0–0.5)
HEMATOCRIT: 31.9 % — AB (ref 38.4–49.9)
HGB: 10.1 g/dL — ABNORMAL LOW (ref 13.0–17.1)
LYMPH%: 16.3 % (ref 14.0–49.0)
MCH: 24.6 pg — AB (ref 27.2–33.4)
MCHC: 31.6 g/dL — AB (ref 32.0–36.0)
MCV: 77.8 fL — ABNORMAL LOW (ref 79.3–98.0)
MONO#: 0.7 10*3/uL (ref 0.1–0.9)
MONO%: 9.2 % (ref 0.0–14.0)
NEUT#: 5.4 10*3/uL (ref 1.5–6.5)
NEUT%: 72.3 % (ref 39.0–75.0)
Platelets: 301 10*3/uL (ref 140–400)
RBC: 4.09 10*6/uL — ABNORMAL LOW (ref 4.20–5.82)
RDW: 20.2 % — ABNORMAL HIGH (ref 11.0–14.6)
WBC: 7.5 10*3/uL (ref 4.0–10.3)
lymph#: 1.2 10*3/uL (ref 0.9–3.3)

## 2014-02-24 MED ORDER — DARBEPOETIN ALFA 300 MCG/0.6ML IJ SOSY
300.0000 ug | PREFILLED_SYRINGE | Freq: Once | INTRAMUSCULAR | Status: DC
  Filled 2014-02-24: qty 0.6

## 2014-02-24 NOTE — Progress Notes (Signed)
Per Dr Julien Nordmann, no aranesp given today due to elevated BP.

## 2014-03-17 ENCOUNTER — Other Ambulatory Visit (HOSPITAL_BASED_OUTPATIENT_CLINIC_OR_DEPARTMENT_OTHER): Payer: Medicare Other

## 2014-03-17 ENCOUNTER — Other Ambulatory Visit: Payer: Self-pay | Admitting: Internal Medicine

## 2014-03-17 ENCOUNTER — Ambulatory Visit (HOSPITAL_BASED_OUTPATIENT_CLINIC_OR_DEPARTMENT_OTHER): Payer: Medicare Other

## 2014-03-17 DIAGNOSIS — N189 Chronic kidney disease, unspecified: Secondary | ICD-10-CM

## 2014-03-17 DIAGNOSIS — D472 Monoclonal gammopathy: Secondary | ICD-10-CM

## 2014-03-17 DIAGNOSIS — Z8546 Personal history of malignant neoplasm of prostate: Secondary | ICD-10-CM

## 2014-03-17 DIAGNOSIS — D638 Anemia in other chronic diseases classified elsewhere: Secondary | ICD-10-CM

## 2014-03-17 DIAGNOSIS — Z531 Procedure and treatment not carried out because of patient's decision for reasons of belief and group pressure: Secondary | ICD-10-CM

## 2014-03-17 DIAGNOSIS — R630 Anorexia: Secondary | ICD-10-CM

## 2014-03-17 DIAGNOSIS — D649 Anemia, unspecified: Secondary | ICD-10-CM

## 2014-03-17 DIAGNOSIS — D631 Anemia in chronic kidney disease: Secondary | ICD-10-CM

## 2014-03-17 DIAGNOSIS — E1121 Type 2 diabetes mellitus with diabetic nephropathy: Secondary | ICD-10-CM

## 2014-03-17 DIAGNOSIS — I159 Secondary hypertension, unspecified: Secondary | ICD-10-CM

## 2014-03-17 LAB — IRON AND TIBC CHCC
%SAT: 26 % (ref 20–55)
Iron: 55 ug/dL (ref 42–163)
TIBC: 209 ug/dL (ref 202–409)
UIBC: 154 ug/dL (ref 117–376)

## 2014-03-17 LAB — COMPREHENSIVE METABOLIC PANEL (CC13)
ALBUMIN: 3.2 g/dL — AB (ref 3.5–5.0)
ALT: 11 U/L (ref 0–55)
AST: 9 U/L (ref 5–34)
Alkaline Phosphatase: 112 U/L (ref 40–150)
Anion Gap: 10 mEq/L (ref 3–11)
BUN: 64.4 mg/dL — ABNORMAL HIGH (ref 7.0–26.0)
CHLORIDE: 104 meq/L (ref 98–109)
CO2: 21 mEq/L — ABNORMAL LOW (ref 22–29)
Calcium: 9.2 mg/dL (ref 8.4–10.4)
Creatinine: 2.9 mg/dL — ABNORMAL HIGH (ref 0.7–1.3)
Glucose: 287 mg/dl — ABNORMAL HIGH (ref 70–140)
POTASSIUM: 4.3 meq/L (ref 3.5–5.1)
Sodium: 134 mEq/L — ABNORMAL LOW (ref 136–145)
TOTAL PROTEIN: 7 g/dL (ref 6.4–8.3)
Total Bilirubin: 0.31 mg/dL (ref 0.20–1.20)

## 2014-03-17 LAB — CBC WITH DIFFERENTIAL/PLATELET
BASO%: 0.4 % (ref 0.0–2.0)
Basophils Absolute: 0 10*3/uL (ref 0.0–0.1)
EOS%: 1.2 % (ref 0.0–7.0)
Eosinophils Absolute: 0.1 10*3/uL (ref 0.0–0.5)
HCT: 30.8 % — ABNORMAL LOW (ref 38.4–49.9)
HEMOGLOBIN: 9.9 g/dL — AB (ref 13.0–17.1)
LYMPH#: 1 10*3/uL (ref 0.9–3.3)
LYMPH%: 12.6 % — ABNORMAL LOW (ref 14.0–49.0)
MCH: 24.9 pg — ABNORMAL LOW (ref 27.2–33.4)
MCHC: 32.1 g/dL (ref 32.0–36.0)
MCV: 77.6 fL — ABNORMAL LOW (ref 79.3–98.0)
MONO#: 0.6 10*3/uL (ref 0.1–0.9)
MONO%: 7 % (ref 0.0–14.0)
NEUT#: 6.4 10*3/uL (ref 1.5–6.5)
NEUT%: 78.8 % — ABNORMAL HIGH (ref 39.0–75.0)
Platelets: 250 10*3/uL (ref 140–400)
RBC: 3.96 10*6/uL — ABNORMAL LOW (ref 4.20–5.82)
RDW: 18.9 % — ABNORMAL HIGH (ref 11.0–14.6)
WBC: 8.1 10*3/uL (ref 4.0–10.3)

## 2014-03-17 LAB — LACTATE DEHYDROGENASE (CC13): LDH: 136 U/L (ref 125–245)

## 2014-03-17 LAB — FERRITIN CHCC: Ferritin: 785 ng/ml — ABNORMAL HIGH (ref 22–316)

## 2014-03-17 MED ORDER — DARBEPOETIN ALFA 300 MCG/0.6ML IJ SOSY
300.0000 ug | PREFILLED_SYRINGE | Freq: Once | INTRAMUSCULAR | Status: AC
  Administered 2014-03-17: 300 ug via SUBCUTANEOUS
  Filled 2014-03-17: qty 0.6

## 2014-03-17 NOTE — Patient Instructions (Signed)
Darbepoetin Alfa injection What is this medicine? DARBEPOETIN ALFA (dar be POE e tin AL fa) helps your body make more red blood cells. It is used to treat anemia caused by chronic kidney failure and chemotherapy. This medicine may be used for other purposes; ask your health care provider or pharmacist if you have questions. COMMON BRAND NAME(S): Aranesp What should I tell my health care provider before I take this medicine? They need to know if you have any of these conditions: -blood clotting disorders or history of blood clots -cancer patient not on chemotherapy -cystic fibrosis -heart disease, such as angina, heart failure, or a history of a heart attack -hemoglobin level of 12 g/dL or greater -high blood pressure -low levels of folate, iron, or vitamin B12 -seizures -an unusual or allergic reaction to darbepoetin, erythropoietin, albumin, hamster proteins, latex, other medicines, foods, dyes, or preservatives -pregnant or trying to get pregnant -breast-feeding How should I use this medicine? This medicine is for injection into a vein or under the skin. It is usually given by a health care professional in a hospital or clinic setting. If you get this medicine at home, you will be taught how to prepare and give this medicine. Do not shake the solution before you withdraw a dose. Use exactly as directed. Take your medicine at regular intervals. Do not take your medicine more often than directed. It is important that you put your used needles and syringes in a special sharps container. Do not put them in a trash can. If you do not have a sharps container, call your pharmacist or healthcare provider to get one. Talk to your pediatrician regarding the use of this medicine in children. While this medicine may be used in children as young as 1 year for selected conditions, precautions do apply. Overdosage: If you think you have taken too much of this medicine contact a poison control center or  emergency room at once. NOTE: This medicine is only for you. Do not share this medicine with others. What if I miss a dose? If you miss a dose, take it as soon as you can. If it is almost time for your next dose, take only that dose. Do not take double or extra doses. What may interact with this medicine? Do not take this medicine with any of the following medications: -epoetin alfa This list may not describe all possible interactions. Give your health care provider a list of all the medicines, herbs, non-prescription drugs, or dietary supplements you use. Also tell them if you smoke, drink alcohol, or use illegal drugs. Some items may interact with your medicine. What should I watch for while using this medicine? Visit your prescriber or health care professional for regular checks on your progress and for the needed blood tests and blood pressure measurements. It is especially important for the doctor to make sure your hemoglobin level is in the desired range, to limit the risk of potential side effects and to give you the best benefit. Keep all appointments for any recommended tests. Check your blood pressure as directed. Ask your doctor what your blood pressure should be and when you should contact him or her. As your body makes more red blood cells, you may need to take iron, folic acid, or vitamin B supplements. Ask your doctor or health care provider which products are right for you. If you have kidney disease continue dietary restrictions, even though this medication can make you feel better. Talk with your doctor or health   care professional about the foods you eat and the vitamins that you take. What side effects may I notice from receiving this medicine? Side effects that you should report to your doctor or health care professional as soon as possible: -allergic reactions like skin rash, itching or hives, swelling of the face, lips, or tongue -breathing problems -changes in vision -chest  pain -confusion, trouble speaking or understanding -feeling faint or lightheaded, falls -high blood pressure -muscle aches or pains -pain, swelling, warmth in the leg -rapid weight gain -severe headaches -sudden numbness or weakness of the face, arm or leg -trouble walking, dizziness, loss of balance or coordination -seizures (convulsions) -swelling of the ankles, feet, hands -unusually weak or tired Side effects that usually do not require medical attention (report to your doctor or health care professional if they continue or are bothersome): -diarrhea -fever, chills (flu-like symptoms) -headaches -nausea, vomiting -redness, stinging, or swelling at site where injected This list may not describe all possible side effects. Call your doctor for medical advice about side effects. You may report side effects to FDA at 1-800-FDA-1088. Where should I keep my medicine? Keep out of the reach of children. Store in a refrigerator between 2 and 8 degrees C (36 and 46 degrees F). Do not freeze. Do not shake. Throw away any unused portion if using a single-dose vial. Throw away any unused medicine after the expiration date. NOTE: This sheet is a summary. It may not cover all possible information. If you have questions about this medicine, talk to your doctor, pharmacist, or health care provider.  2015, Elsevier/[employer] Standard. (2008-03-23 10:23:57)  

## 2014-03-22 LAB — KAPPA/LAMBDA LIGHT CHAINS
Kappa free light chain: 5.41 mg/dL — ABNORMAL HIGH (ref 0.33–1.94)
Kappa:Lambda Ratio: 1.27 (ref 0.26–1.65)
Lambda Free Lght Chn: 4.27 mg/dL — ABNORMAL HIGH (ref 0.57–2.63)

## 2014-03-22 LAB — IGG, IGA, IGM
IGG (IMMUNOGLOBIN G), SERUM: 1090 mg/dL (ref 650–1600)
IgA: 218 mg/dL (ref 68–379)
IgM, Serum: 175 mg/dL (ref 41–251)

## 2014-03-22 LAB — BETA 2 MICROGLOBULIN, SERUM: BETA 2 MICROGLOBULIN: 6.61 mg/L — AB (ref <=2.51)

## 2014-03-31 ENCOUNTER — Other Ambulatory Visit (HOSPITAL_BASED_OUTPATIENT_CLINIC_OR_DEPARTMENT_OTHER): Payer: Medicare Other

## 2014-03-31 ENCOUNTER — Telehealth: Payer: Self-pay | Admitting: Internal Medicine

## 2014-03-31 ENCOUNTER — Ambulatory Visit (HOSPITAL_BASED_OUTPATIENT_CLINIC_OR_DEPARTMENT_OTHER): Payer: Medicare Other | Admitting: Internal Medicine

## 2014-03-31 ENCOUNTER — Other Ambulatory Visit: Payer: Self-pay | Admitting: Medical Oncology

## 2014-03-31 ENCOUNTER — Encounter: Payer: Self-pay | Admitting: Internal Medicine

## 2014-03-31 ENCOUNTER — Ambulatory Visit (HOSPITAL_BASED_OUTPATIENT_CLINIC_OR_DEPARTMENT_OTHER): Payer: Medicare Other

## 2014-03-31 VITALS — BP 160/90 | HR 82 | Temp 97.0°F | Resp 20 | Ht 71.0 in | Wt 264.0 lb

## 2014-03-31 DIAGNOSIS — N189 Chronic kidney disease, unspecified: Secondary | ICD-10-CM

## 2014-03-31 DIAGNOSIS — D638 Anemia in other chronic diseases classified elsewhere: Secondary | ICD-10-CM

## 2014-03-31 DIAGNOSIS — I10 Essential (primary) hypertension: Secondary | ICD-10-CM

## 2014-03-31 DIAGNOSIS — D631 Anemia in chronic kidney disease: Secondary | ICD-10-CM

## 2014-03-31 DIAGNOSIS — D649 Anemia, unspecified: Secondary | ICD-10-CM

## 2014-03-31 DIAGNOSIS — D472 Monoclonal gammopathy: Secondary | ICD-10-CM

## 2014-03-31 LAB — CBC WITH DIFFERENTIAL/PLATELET
BASO%: 0.6 % (ref 0.0–2.0)
BASOS ABS: 0 10*3/uL (ref 0.0–0.1)
EOS ABS: 0.1 10*3/uL (ref 0.0–0.5)
EOS%: 1.6 % (ref 0.0–7.0)
HEMATOCRIT: 31 % — AB (ref 38.4–49.9)
HEMOGLOBIN: 9.7 g/dL — AB (ref 13.0–17.1)
LYMPH#: 0.9 10*3/uL (ref 0.9–3.3)
LYMPH%: 12.8 % — AB (ref 14.0–49.0)
MCH: 24.6 pg — ABNORMAL LOW (ref 27.2–33.4)
MCHC: 31.3 g/dL — ABNORMAL LOW (ref 32.0–36.0)
MCV: 78.6 fL — ABNORMAL LOW (ref 79.3–98.0)
MONO#: 0.6 10*3/uL (ref 0.1–0.9)
MONO%: 7.9 % (ref 0.0–14.0)
NEUT#: 5.5 10*3/uL (ref 1.5–6.5)
NEUT%: 77.1 % — ABNORMAL HIGH (ref 39.0–75.0)
Platelets: 232 10*3/uL (ref 140–400)
RBC: 3.95 10*6/uL — ABNORMAL LOW (ref 4.20–5.82)
RDW: 18.9 % — ABNORMAL HIGH (ref 11.0–14.6)
WBC: 7.1 10*3/uL (ref 4.0–10.3)

## 2014-03-31 MED ORDER — CLONIDINE HCL 0.1 MG PO TABS
0.2000 mg | ORAL_TABLET | ORAL | Status: AC
  Administered 2014-03-31: 0.2 mg via ORAL

## 2014-03-31 MED ORDER — DARBEPOETIN ALFA 300 MCG/0.6ML IJ SOSY
300.0000 ug | PREFILLED_SYRINGE | Freq: Once | INTRAMUSCULAR | Status: AC
  Administered 2014-03-31: 300 ug via SUBCUTANEOUS
  Filled 2014-03-31: qty 0.6

## 2014-03-31 NOTE — Telephone Encounter (Signed)
Gave avs & cal for Dec thru March. °

## 2014-03-31 NOTE — Progress Notes (Signed)
[place] [phone]   [phone] OFFICE PROGRESS NOTE   DIAGNOSIS:  1) Anemia of chronic disease.  2) Monoclonal gammopathy of undetermined significance. 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 Janice Norrie.   PRIOR THERAPY: Feraheme Infusion last dose was given 12/05/2012   CURRENT THERAPY: Aranesp 300 mcg subcutaneously every 3 weeks.  INTERVAL HISTORY: Roger Wood 73 y.o. male returns to the clinic today for followup visit accompanied by his wife. The patient is feeling fine today with no specific complaints except for mild fatigue. He denied having any chest pain, shortness of breath, cough or hemoptysis. He is tolerating his treatment with Aranesp 300 mcg subcutaneously every 3 weeks fairly well but he missed a few injections because of the severe hypertension. He denied having any significant weight loss or night sweats. He denied having any nausea or vomiting or rectal bleeding. He has repeat myeloma panel in addition to iron study and ferritin performed earlier today and he is here for evaluation and discussion of his lab results.  MEDICAL HISTORY: Past Medical History  Diagnosis Date  . Hypertension   . Gout   . Arthritis   . Diabetes mellitus     adult onset   . History of radiation therapy 01/27/11-03/26/11    prostate  . Refusal of blood transfusions as patient is Jehovah's Witness   . Cataract     b/l catartact surgery  . Anemia     takes iron supplements  . Type II diabetes mellitus with nephropathy   . Prostate cancer 07/14/10    dx    ALLERGIES:  has No Known Allergies.  MEDICATIONS:  Current Outpatient Prescriptions  Medication Sig Dispense Refill  . amLODipine (NORVASC) 10 MG tablet Take 10 mg by mouth every morning.    . furosemide (LASIX) 40 MG tablet Take 40 mg by mouth daily as needed (For leg swelling.).    Marland Kitchen potassium  chloride (K-DUR,KLOR-CON) 10 MEQ tablet Take 20 mEq by mouth daily as needed (For potassium loss.).     Marland Kitchen pravastatin (PRAVACHOL) 40 MG tablet Take 40 mg by mouth.    . thiamine 100 MG tablet Take 1 tablet (100 mg total) by mouth daily. 30 tablet 1   No current facility-administered medications for this visit.   Facility-Administered Medications Ordered in Other Visits  Medication Dose Route Frequency Provider Last Rate Last Dose  . Darbepoetin Alfa (ARANESP) injection 300 mcg  300 mcg Subcutaneous Once Curt Bears, MD        REVIEW OF SYSTEMS:  Constitutional: positive for fatigue Eyes: negative Ears, nose, mouth, throat, and face: negative Respiratory: positive for dyspnea on exertion Cardiovascular: negative Gastrointestinal: negative Genitourinary:negative Integument/breast: negative Hematologic/lymphatic: negative Musculoskeletal:negative Neurological: negative Behavioral/Psych: negative Endocrine: negative Allergic/Immunologic: negative   PHYSICAL EXAMINATION: General appearance: alert, cooperative, fatigued and no distress Head: Normocephalic, without obvious abnormality, atraumatic Neck: no adenopathy, no JVD, supple, symmetrical, trachea midline and thyroid not enlarged, symmetric, no tenderness/mass/nodules Lymph nodes: Cervical, supraclavicular, and axillary nodes normal. Resp: clear to auscultation bilaterally Back: symmetric, no curvature. ROM normal. No CVA tenderness. Cardio: regular rate and rhythm, S1, S2 normal, no murmur, click, rub or gallop GI: soft, non-tender; bowel sounds normal; no masses,  no organomegaly Extremities: extremities normal, atraumatic, no cyanosis or edema  ECOG PERFORMANCE STATUS: 2 - Symptomatic, <50% confined to bed  Blood pressure 180/71, pulse 82, temperature  28 F (36.1 C), temperature source Oral, resp. rate 20, height 5\' 11"  (1.803 m), weight 264 lb (119.75 kg).  LABORATORY DATA: Lab Results  Component Value Date   WBC  7.1 03/31/2014   HGB 9.7* 03/31/2014   HCT 31.0* 03/31/2014   MCV 78.6* 03/31/2014   PLT 232 03/31/2014      Chemistry      Component Value Date/Time   NA 134* 03/17/2014 1238   NA 135 12/18/2012 0333   K 4.3 03/17/2014 1238   K 3.4* 12/18/2012 0333   CL 102 12/18/2012 0333   CL 106 01/29/2012 1520   CO2 21* 03/17/2014 1238   CO2 21 12/18/2012 0333   BUN 64.4* 03/17/2014 1238   BUN 52* 12/18/2012 0333   CREATININE 2.9* 03/17/2014 1238   CREATININE 1.96* 12/18/2012 0333      Component Value Date/Time   CALCIUM 9.2 03/17/2014 1238   CALCIUM 8.7 12/18/2012 0333   ALKPHOS 112 03/17/2014 1238   ALKPHOS 141* 12/15/2012 0400   AST 9 03/17/2014 1238   AST 31 12/15/2012 0400   ALT 11 03/17/2014 1238   ALT 63* 12/15/2012 0400   BILITOT 0.31 03/17/2014 1238   BILITOT 0.2* 12/15/2012 0400       RADIOGRAPHIC STUDIES:   ASSESSMENT AND PLAN: This is a very pleasant 73 years old [place] American male with persistent anemia most likely secondary to anemia of chronic disease secondary to chronic renal insufficiency.   His myeloma panel and iron study showed no significant disease progression or iron deficiency. The patient continues to have persistent anemia and he is Jehovah's Witness. I recommended for him to continue treatment with Aranesp 300 mcg subcutaneously every 3 weeks for the anemia of chronic disease. Unfortunately he missed a few injections because of the severe hypertension. His blood pressure was also elevated today and I gave the patient a dose of clonidine 0.2 mg by mouth 1. I would see him back for followup visit in 3 months with repeat CBC and iron study. He was advised to call immediately if he has any concerning symptoms in the interval. The patient voices understanding of current disease status and treatment options and is in agreement with the current care plan.  All questions were answered. The patient knows to call the clinic with any problems, questions or  concerns. We can certainly see the patient much sooner if necessary.  Disclaimer: This note was dictated with voice recognition software. Similar sounding words can inadvertently be transcribed and may not be corrected upon review.

## 2014-04-08 ENCOUNTER — Ambulatory Visit: Payer: Medicare Other

## 2014-04-08 ENCOUNTER — Other Ambulatory Visit: Payer: Medicare Other | Admitting: Lab

## 2014-04-09 ENCOUNTER — Observation Stay (HOSPITAL_COMMUNITY): Payer: Medicare Other

## 2014-04-09 ENCOUNTER — Encounter (HOSPITAL_COMMUNITY): Payer: Self-pay

## 2014-04-09 ENCOUNTER — Inpatient Hospital Stay (HOSPITAL_COMMUNITY)
Admission: EM | Admit: 2014-04-09 | Discharge: 2014-04-12 | DRG: 069 | Disposition: A | Discharge status: Home / Self Care | Payer: Medicare Other | Attending: Internal Medicine | Admitting: Internal Medicine

## 2014-04-09 ENCOUNTER — Other Ambulatory Visit: Payer: Self-pay

## 2014-04-09 DIAGNOSIS — Z6835 Body mass index (BMI) 35.0-35.9, adult: Secondary | ICD-10-CM

## 2014-04-09 DIAGNOSIS — G8191 Hemiplegia, unspecified affecting right dominant side: Secondary | ICD-10-CM | POA: Diagnosis present

## 2014-04-09 DIAGNOSIS — M1990 Unspecified osteoarthritis, unspecified site: Secondary | ICD-10-CM | POA: Diagnosis present

## 2014-04-09 DIAGNOSIS — Z8546 Personal history of malignant neoplasm of prostate: Secondary | ICD-10-CM

## 2014-04-09 DIAGNOSIS — N189 Chronic kidney disease, unspecified: Secondary | ICD-10-CM | POA: Diagnosis present

## 2014-04-09 DIAGNOSIS — Z79899 Other long term (current) drug therapy: Secondary | ICD-10-CM

## 2014-04-09 DIAGNOSIS — I10 Essential (primary) hypertension: Secondary | ICD-10-CM | POA: Diagnosis present

## 2014-04-09 DIAGNOSIS — Z833 Family history of diabetes mellitus: Secondary | ICD-10-CM

## 2014-04-09 DIAGNOSIS — G459 Transient cerebral ischemic attack, unspecified: Secondary | ICD-10-CM | POA: Diagnosis not present

## 2014-04-09 DIAGNOSIS — C61 Malignant neoplasm of prostate: Secondary | ICD-10-CM | POA: Diagnosis present

## 2014-04-09 DIAGNOSIS — Z823 Family history of stroke: Secondary | ICD-10-CM

## 2014-04-09 DIAGNOSIS — I5032 Chronic diastolic (congestive) heart failure: Secondary | ICD-10-CM | POA: Diagnosis present

## 2014-04-09 DIAGNOSIS — G452 Multiple and bilateral precerebral artery syndromes: Secondary | ICD-10-CM

## 2014-04-09 DIAGNOSIS — E1121 Type 2 diabetes mellitus with diabetic nephropathy: Secondary | ICD-10-CM

## 2014-04-09 DIAGNOSIS — E785 Hyperlipidemia, unspecified: Secondary | ICD-10-CM | POA: Diagnosis present

## 2014-04-09 DIAGNOSIS — R9431 Abnormal electrocardiogram [ECG] [EKG]: Secondary | ICD-10-CM

## 2014-04-09 DIAGNOSIS — D638 Anemia in other chronic diseases classified elsewhere: Secondary | ICD-10-CM | POA: Diagnosis present

## 2014-04-09 DIAGNOSIS — Z8249 Family history of ischemic heart disease and other diseases of the circulatory system: Secondary | ICD-10-CM

## 2014-04-09 DIAGNOSIS — I509 Heart failure, unspecified: Secondary | ICD-10-CM

## 2014-04-09 DIAGNOSIS — M6289 Other specified disorders of muscle: Secondary | ICD-10-CM

## 2014-04-09 DIAGNOSIS — I129 Hypertensive chronic kidney disease with stage 1 through stage 4 chronic kidney disease, or unspecified chronic kidney disease: Secondary | ICD-10-CM | POA: Diagnosis present

## 2014-04-09 DIAGNOSIS — E669 Obesity, unspecified: Secondary | ICD-10-CM | POA: Diagnosis present

## 2014-04-09 DIAGNOSIS — R531 Weakness: Secondary | ICD-10-CM | POA: Diagnosis present

## 2014-04-09 DIAGNOSIS — Z87891 Personal history of nicotine dependence: Secondary | ICD-10-CM

## 2014-04-09 DIAGNOSIS — D472 Monoclonal gammopathy: Secondary | ICD-10-CM | POA: Diagnosis present

## 2014-04-09 DIAGNOSIS — Z9119 Patient's noncompliance with other medical treatment and regimen: Secondary | ICD-10-CM | POA: Diagnosis present

## 2014-04-09 DIAGNOSIS — Z923 Personal history of irradiation: Secondary | ICD-10-CM

## 2014-04-09 DIAGNOSIS — M109 Gout, unspecified: Secondary | ICD-10-CM | POA: Diagnosis present

## 2014-04-09 DIAGNOSIS — Z531 Procedure and treatment not carried out because of patient's decision for reasons of belief and group pressure: Secondary | ICD-10-CM | POA: Diagnosis present

## 2014-04-09 LAB — I-STAT TROPONIN, ED: Troponin i, poc: 0.04 ng/mL (ref 0.00–0.08)

## 2014-04-09 LAB — COMPREHENSIVE METABOLIC PANEL
ALK PHOS: 96 U/L (ref 39–117)
ALT: 11 U/L (ref 0–53)
AST: 11 U/L (ref 0–37)
Albumin: 3.1 g/dL — ABNORMAL LOW (ref 3.5–5.2)
Anion gap: 16 — ABNORMAL HIGH (ref 5–15)
BUN: 51 mg/dL — ABNORMAL HIGH (ref 6–23)
CHLORIDE: 100 meq/L (ref 96–112)
CO2: 18 mEq/L — ABNORMAL LOW (ref 19–32)
CREATININE: 2.85 mg/dL — AB (ref 0.50–1.35)
Calcium: 8.7 mg/dL (ref 8.4–10.5)
GFR calc Af Amer: 23 mL/min — ABNORMAL LOW (ref >90)
GFR, EST NON AFRICAN AMERICAN: 20 mL/min — AB (ref >90)
Glucose, Bld: 199 mg/dL — ABNORMAL HIGH (ref 70–99)
Potassium: 4.3 mEq/L (ref 3.7–5.3)
Sodium: 134 mEq/L — ABNORMAL LOW (ref 137–147)
Total Bilirubin: 0.4 mg/dL (ref 0.3–1.2)
Total Protein: 7.2 g/dL (ref 6.0–8.3)

## 2014-04-09 LAB — CBC WITH DIFFERENTIAL/PLATELET
Basophils Absolute: 0 10*3/uL (ref 0.0–0.1)
Basophils Relative: 0 % (ref 0–1)
Eosinophils Absolute: 0.1 10*3/uL (ref 0.0–0.7)
Eosinophils Relative: 1 % (ref 0–5)
HEMATOCRIT: 30.7 % — AB (ref 39.0–52.0)
HEMOGLOBIN: 9.8 g/dL — AB (ref 13.0–17.0)
Lymphocytes Relative: 13 % (ref 12–46)
Lymphs Abs: 1 10*3/uL (ref 0.7–4.0)
MCH: 24.9 pg — ABNORMAL LOW (ref 26.0–34.0)
MCHC: 31.9 g/dL (ref 30.0–36.0)
MCV: 77.9 fL — ABNORMAL LOW (ref 78.0–100.0)
MONO ABS: 0.7 10*3/uL (ref 0.1–1.0)
Monocytes Relative: 9 % (ref 3–12)
Neutro Abs: 6 10*3/uL (ref 1.7–7.7)
Neutrophils Relative %: 77 % (ref 43–77)
Platelets: 319 10*3/uL (ref 150–400)
RBC: 3.94 MIL/uL — ABNORMAL LOW (ref 4.22–5.81)
RDW: 17.6 % — AB (ref 11.5–15.5)
WBC: 7.9 10*3/uL (ref 4.0–10.5)

## 2014-04-09 LAB — CBC
HCT: 30.9 % — ABNORMAL LOW (ref 39.0–52.0)
Hemoglobin: 10 g/dL — ABNORMAL LOW (ref 13.0–17.0)
MCH: 24.6 pg — ABNORMAL LOW (ref 26.0–34.0)
MCHC: 32.4 g/dL (ref 30.0–36.0)
MCV: 76.1 fL — AB (ref 78.0–100.0)
PLATELETS: 291 10*3/uL (ref 150–400)
RBC: 4.06 MIL/uL — ABNORMAL LOW (ref 4.22–5.81)
RDW: 17.8 % — ABNORMAL HIGH (ref 11.5–15.5)
WBC: 7.4 10*3/uL (ref 4.0–10.5)

## 2014-04-09 LAB — TROPONIN I

## 2014-04-09 LAB — GLUCOSE, CAPILLARY: GLUCOSE-CAPILLARY: 255 mg/dL — AB (ref 70–99)

## 2014-04-09 LAB — RAPID URINE DRUG SCREEN, HOSP PERFORMED
AMPHETAMINES: NOT DETECTED
BARBITURATES: NOT DETECTED
BENZODIAZEPINES: NOT DETECTED
COCAINE: NOT DETECTED
Opiates: NOT DETECTED
TETRAHYDROCANNABINOL: NOT DETECTED

## 2014-04-09 LAB — PRO B NATRIURETIC PEPTIDE: Pro B Natriuretic peptide (BNP): 537.6 pg/mL — ABNORMAL HIGH (ref 0–450)

## 2014-04-09 LAB — CREATININE, SERUM
Creatinine, Ser: 2.65 mg/dL — ABNORMAL HIGH (ref 0.50–1.35)
GFR calc non Af Amer: 22 mL/min — ABNORMAL LOW (ref >90)
GFR, EST AFRICAN AMERICAN: 26 mL/min — AB (ref >90)

## 2014-04-09 MED ORDER — ACETAMINOPHEN 325 MG PO TABS: 650.0000 mg | ORAL_TABLET | ORAL | Status: DC | PRN

## 2014-04-09 MED ORDER — PRAVASTATIN SODIUM 40 MG PO TABS
40.0000 mg | ORAL_TABLET | Freq: Every day | ORAL | Status: DC
  Administered 2014-04-09: 40 mg via ORAL
  Filled 2014-04-09 (×2): qty 1

## 2014-04-09 MED ORDER — PANTOPRAZOLE SODIUM 40 MG PO TBEC
40.0000 mg | DELAYED_RELEASE_TABLET | Freq: Every day | ORAL | Status: DC
  Administered 2014-04-10 – 2014-04-12 (×3): 40 mg via ORAL
  Filled 2014-04-09 (×4): qty 1

## 2014-04-09 MED ORDER — ONDANSETRON HCL 4 MG/2ML IJ SOLN: 4.0000 mg | Freq: Four times a day (QID) | INTRAMUSCULAR | Status: DC | PRN

## 2014-04-09 MED ORDER — SODIUM CHLORIDE 0.9 % IV SOLN
INTRAVENOUS | Status: AC
  Administered 2014-04-09: 17:00:00 via INTRAVENOUS

## 2014-04-09 MED ORDER — SENNOSIDES-DOCUSATE SODIUM 8.6-50 MG PO TABS: 1.0000 | ORAL_TABLET | Freq: Every evening | ORAL | Status: DC | PRN

## 2014-04-09 MED ORDER — HEPARIN SODIUM (PORCINE) 5000 UNIT/ML IJ SOLN
5000.0000 [IU] | Freq: Three times a day (TID) | INTRAMUSCULAR | Status: DC
  Administered 2014-04-09 – 2014-04-12 (×8): 5000 [IU] via SUBCUTANEOUS
  Filled 2014-04-09 (×8): qty 1

## 2014-04-09 MED ORDER — ASPIRIN 325 MG PO TABS
325.0000 mg | ORAL_TABLET | Freq: Every day | ORAL | Status: DC
  Administered 2014-04-09 – 2014-04-12 (×4): 325 mg via ORAL
  Filled 2014-04-09 (×5): qty 1

## 2014-04-09 MED ORDER — ASPIRIN 300 MG RE SUPP: 300.0000 mg | Freq: Every day | RECTAL | Status: DC

## 2014-04-09 MED ORDER — VITAMIN B-1 100 MG PO TABS: 100.0000 mg | ORAL_TABLET | Freq: Every day | ORAL | Status: DC

## 2014-04-09 MED ORDER — ACETAMINOPHEN 650 MG RE SUPP: 650.0000 mg | RECTAL | Status: DC | PRN

## 2014-04-09 MED ORDER — AMLODIPINE BESYLATE 10 MG PO TABS
10.0000 mg | ORAL_TABLET | Freq: Every morning | ORAL | Status: DC
  Administered 2014-04-10 – 2014-04-12 (×3): 10 mg via ORAL
  Filled 2014-04-09 (×4): qty 1

## 2014-04-09 MED ORDER — INSULIN ASPART 100 UNIT/ML ~~LOC~~ SOLN
0.0000 [IU] | Freq: Three times a day (TID) | SUBCUTANEOUS | Status: DC
  Administered 2014-04-09: 2 [IU] via SUBCUTANEOUS
  Administered 2014-04-10 – 2014-04-11 (×6): 3 [IU] via SUBCUTANEOUS
  Administered 2014-04-12 (×2): 2 [IU] via SUBCUTANEOUS

## 2014-04-09 MED ORDER — HYDRALAZINE HCL 20 MG/ML IJ SOLN
10.0000 mg | Freq: Four times a day (QID) | INTRAMUSCULAR | Status: DC | PRN
  Administered 2014-04-09 – 2014-04-11 (×3): 10 mg via INTRAVENOUS
  Filled 2014-04-09 (×4): qty 1

## 2014-04-09 MED ORDER — IRBESARTAN 300 MG PO TABS
300.0000 mg | ORAL_TABLET | Freq: Every day | ORAL | Status: DC
  Administered 2014-04-09 – 2014-04-12 (×4): 300 mg via ORAL
  Filled 2014-04-09 (×5): qty 1

## 2014-04-09 MED ORDER — STROKE: EARLY STAGES OF RECOVERY BOOK
Freq: Once | Status: AC
  Administered 2014-04-09: 17:00:00
  Filled 2014-04-09: qty 1

## 2014-04-09 NOTE — ED Notes (Signed)
Transport team loading patient onto stretcher at this time

## 2014-04-09 NOTE — ED Notes (Signed)
Hospitalist at bedside 

## 2014-04-09 NOTE — Consult Note (Addendum)
Referring Physician: Grandville Silos    Chief Complaint: Right sided weakness  HPI: Roger Wood is an 73 y.o. male with multiple medical problems who reports that early this morning while up watching television had the acute onset of right sided weakness.  He reports being able to work himself to the bedroom where he went to sleep.  When he awakened his symptoms had resolved but he decided to present for evaluation.   The patient also reports having an episode while driving about a week ago when he had blurry vision.  Onset was acute.  When he closed one eye the vision was normal.  He drove the entire way home with just one eye open.  He then went to sleep and when he awakened his vision had returned to normal.    Date last known well: Date: 04/09/2014 Time last known well: Time: 04:00 tPA Given: No: Resolution of symptoms  Past Medical History  Diagnosis Date  . Hypertension   . Gout   . Arthritis   . History of radiation therapy 01/27/11-03/26/11    prostate  . Refusal of blood transfusions as patient is Jehovah's Witness   . Cataract     b/l catartact surgery  . Anemia     takes iron supplements  . Prostate cancer 07/14/10    dx  . Diabetes mellitus     adult onset   . Type II diabetes mellitus with nephropathy     Patient denies    History reviewed. No pertinent past surgical history.  Family History  Problem Relation Age of Onset  . Breast cancer Sister   . Breast cancer Paternal Aunt   . Colon cancer Neg Hx   . Diabetes Mother   . Diabetes Sister   . Diabetes Paternal Aunt   . Heart disease Father   . Stroke Paternal Uncle   . Cancer Paternal Uncle     back   Social History:  reports that he quit smoking about 46 years ago. His smoking use included Cigarettes. He smoked 0.00 packs per day. He has never used smokeless tobacco. He reports that he drinks alcohol. He reports that he does not use illicit drugs.  Allergies: No Known Allergies  Medications:  I have reviewed  the patient's current medications. Prior to Admission:  Prescriptions prior to admission  Medication Sig Dispense Refill Last Dose  . amLODipine (NORVASC) 10 MG tablet Take 10 mg by mouth every morning.   04/08/2014 at Unknown time  . Azilsartan Medoxomil (EDARBI) 80 MG TABS Take 80 mg by mouth daily.   04/08/2014 at Unknown time  . furosemide (LASIX) 40 MG tablet Take 40 mg by mouth daily as needed (For leg swelling.).   04/08/2014 at Unknown time  . pravastatin (PRAVACHOL) 40 MG tablet Take 40 mg by mouth.   04/08/2014 at Unknown time  . potassium chloride (K-DUR,KLOR-CON) 10 MEQ tablet Take 20 mEq by mouth daily as needed (For potassium loss.).    Not Taking  . thiamine 100 MG tablet Take 1 tablet (100 mg total) by mouth daily. (Patient not taking: Reported on 04/09/2014) 30 tablet 1 Not Taking   Scheduled: . [START ON 04/10/2014] amLODipine  10 mg Oral q morning - 10a  . aspirin  300 mg Rectal Daily   Or  . aspirin  325 mg Oral Daily  . heparin  5,000 Units Subcutaneous 3 times per day  . insulin aspart  0-15 Units Subcutaneous TID WC  . irbesartan  300  mg Oral Daily  . [START ON 04/10/2014] pantoprazole  40 mg Oral Q0600  . pravastatin  40 mg Oral Q2200    ROS: History obtained from the patient  General ROS: negative for - chills, fatigue, fever, night sweats, weight gain or weight loss Psychological ROS: negative for - behavioral disorder, hallucinations, memory difficulties, mood swings or suicidal ideation Ophthalmic ROS: negative for - blurry vision, double vision, eye pain or loss of vision ENT ROS: negative for - epistaxis, nasal discharge, oral lesions, sore throat, tinnitus or vertigo Allergy and Immunology ROS: negative for - hives or itchy/watery eyes Hematological and Lymphatic ROS: negative for - bleeding problems, bruising or swollen lymph nodes Endocrine ROS: negative for - galactorrhea, hair pattern changes, polydipsia/polyuria or temperature  intolerance Respiratory ROS: negative for - cough, hemoptysis, shortness of breath or wheezing Cardiovascular ROS: bilateral lower extremity edema Gastrointestinal ROS: negative for - abdominal pain, diarrhea, hematemesis, nausea/vomiting or stool incontinence Genito-Urinary ROS: negative for - dysuria, hematuria, incontinence or urinary frequency/urgency Musculoskeletal ROS: negative for - joint swelling or muscular weakness Neurological ROS: as noted in HPI Dermatological ROS: negative for rash and skin lesion changes  Physical Examination: Blood pressure 193/67, pulse 71, temperature 97.6 F (36.4 C), temperature source Oral, resp. rate 18, height 5\' 11"  (1.803 m), weight 115 kg (253 lb 8.5 oz), SpO2 98 %.  HEENT-  Normocephalic, no lesions, without obvious abnormality.  Normal external eye and conjunctiva.  Normal TM's bilaterally.  Normal auditory canals and external ears. Normal external nose, mucus membranes and septum.  Normal pharynx. Cardiovascular- S1, S2 normal, pulses palpable throughout   Lungs- chest clear, no wheezing, rales, normal symmetric air entry Abdomen- soft, non-tender; bowel sounds normal; no masses,  no organomegaly Extremities- 1+ edema Lymph-no adenopathy palpable Musculoskeletal-no joint tenderness, deformity or swelling Skin-warm and dry, no hyperpigmentation, vitiligo, or suspicious lesions  Neurological Examination Mental Status: Alert, oriented, thought content appropriate.  Speech fluent without evidence of aphasia.  Able to follow 3 step commands without difficulty. Cranial Nerves: II: Discs flat bilaterally; Visual fields grossly normal, pupils equal, round, reactive to light and accommodation III,IV, VI: ptosis not present, extra-ocular motions intact bilaterally V,VII: smile symmetric, facial light touch sensation normal bilaterally VIII: hearing normal bilaterally IX,X: gag reflex present XI: bilateral shoulder shrug XII: midline tongue  extension Motor: Right : Upper extremity   5/5    Left:     Upper extremity   5/5  Lower extremity   5/5     Lower extremity   5/5 Tone and bulk:normal tone throughout; no atrophy noted Sensory: Pinprick and light touch intact throughout, bilaterally Deep Tendon Reflexes: 2+ and symmetric throughout with absent AJ's bilaterally Plantars: Right: mute   Left: mute Cerebellar: normal finger-to-nose and normal heel-to-shin testing bilaterally   Laboratory Studies:  Basic Metabolic Panel:  Recent Labs Lab 04/09/14 1212  NA 134*  K 4.3  CL 100  CO2 18*  GLUCOSE 199*  BUN 51*  CREATININE 2.85*  CALCIUM 8.7    Liver Function Tests:  Recent Labs Lab 04/09/14 1212  AST 11  ALT 11  ALKPHOS 96  BILITOT 0.4  PROT 7.2  ALBUMIN 3.1*   No results for input(s): LIPASE, AMYLASE in the last 168 hours. No results for input(s): AMMONIA in the last 168 hours.  CBC:  Recent Labs Lab 04/09/14 1212  WBC 7.9  NEUTROABS 6.0  HGB 9.8*  HCT 30.7*  MCV 77.9*  PLT 319    Cardiac Enzymes: No results  for input(s): CKTOTAL, CKMB, CKMBINDEX, TROPONINI in the last 168 hours.  BNP: Invalid input(s): POCBNP  CBG: No results for input(s): GLUCAP in the last 168 hours.  Microbiology: Results for orders placed or performed during the hospital encounter of 12/14/12  Body fluid culture     Status: None   Collection Time: 12/15/12 11:31 AM  Result Value Ref Range Status   Specimen Description KNEE RIGHT  Final   Special Requests Normal  Final   Gram Stain   Final    RARE WBC PRESENT, PREDOMINANTLY PMN NO ORGANISMS SEEN Performed at [facility]   Culture   Final    NO GROWTH 3 DAYS Performed at [facility]   Report Status 12/18/2012 FINAL  Final    Coagulation Studies: No results for input(s): LABPROT, INR in the last 72 hours.  Urinalysis: No results for input(s): COLORURINE, LABSPEC, PHURINE, GLUCOSEU, HGBUR, BILIRUBINUR, KETONESUR, PROTEINUR,  UROBILINOGEN, NITRITE, LEUKOCYTESUR in the last 168 hours.  Invalid input(s): APPERANCEUR  Lipid Panel: No results found for: CHOL, TRIG, HDL, CHOLHDL, VLDL, LDLCALC  HgbA1C:  Lab Results  Component Value Date   HGBA1C 8.9* 12/14/2012    Urine Drug Screen:  No results found for: LABOPIA, COCAINSCRNUR, LABBENZ, AMPHETMU, THCU, LABBARB  Alcohol Level: No results for input(s): ETH in the last 168 hours.  Other results: EKG: sinus rhythm at 75 bpm.  Imaging: Ct Head Wo Contrast  04/09/2014   CLINICAL DATA:  Episode of blurred vision 1 week ago. Recurrent episode today with right arm and right leg weakness.  EXAM: CT HEAD WITHOUT CONTRAST  TECHNIQUE: Contiguous axial images were obtained from the base of the skull through the vertex without intravenous contrast.  COMPARISON:  None.  FINDINGS: No evidence of an acute infarct, acute hemorrhage, mass lesion, mass effect or hydrocephalus. 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 Mild atrophy. Mild periventricular low attenuation. No air-fluid levels in the paranasal sinuses or mastoid air cells.  IMPRESSION: 1. No acute intracranial abnormality. 2. Mild atrophy and minimal chronic microvascular white matter ischemic changes.   Electronically Signed   By: Lorin Picket M.D.   On: 04/09/2014 15:16    Assessment: 73 y.o. male with multiple stroke risk factors admitted after two transient events, one of right sided weakness and one of blurred vision.  Concern is for TIA.  Head CT personally reviewed and shows no acute changes.  Neurological examination currently is nonfocal.  Patient on no antiplatelet therapy at home.  Stroke Risk Factors - diabetes mellitus and hypertension  Plan: 1. HgbA1c, fasting lipid panel 2. MRI, MRA  of the brain without contrast 3. PT consult, OT consult, Speech consult 4. Echocardiogram 5. Carotid dopplers 6. Prophylactic therapy-Antiplatelet med: Aspirin - dose 325mg   daily 7. NPO until RN stroke swallow screen 8. Telemetry monitoring 9. Frequent neuro checks 10. BP control  Alexis Goodell, MD [facility] [phone] 04/09/2014, 5:51 PM

## 2014-04-09 NOTE — H&P (Signed)
[facility] History and Physical  Roger Wood KDT:[id number] DOB: 16-Jul-1940 DOA: 04/09/2014  Referring physician: Dr. Mingo Amber PCP: Gearlean Alf., PA-C   Chief Complaint: Visual changes/right-sided weakness  HPI: Roger Wood is a 73 y.o. 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 Janice Norrie who presents to the ED with visual changes and right-sided weakness. Patient states he was watching television around 4:56 AM he noticed right upper extremity weakness as well as right lower extremity weakness. Patient stated subsequently went to bed woke up around 8:30 in the morning and his symptoms had resolved. Patient also noticed that one week prior to admission he had similar symptoms with some associated visual changes. Patient describes a blurriness which also resolved. Patient denies any slurred speech, no facial droop, no tingling, no numbness, no fever, no chills, no chest pain, no shortness of breath, no abdominal pain, no dysuria, no melena, no hematemesis, no hematochezia. Patient does endorse some bouts of nausea and emesis over the past 3 days. Patient was seen in the ED at which time his symptoms had resolved. [facility] were consulted to admit the patient for further evaluation and management.   Review of Systems: As per history of present illness otherwise negative. Constitutional:  No weight loss, night sweats, Fevers, chills, fatigue.  HEENT:  No headaches, Difficulty swallowing,Tooth/dental problems,Sore throat,  No sneezing, itching, ear ache, nasal congestion, post nasal drip,  Cardio-vascular:  No chest pain, Orthopnea, PND, swelling in lower extremities, anasarca, dizziness, palpitations  GI:  No heartburn, indigestion, abdominal pain, nausea, vomiting, diarrhea,  change in bowel habits, loss of appetite  Resp:  No shortness of breath with exertion or at rest. No excess mucus, no productive cough, No non-productive cough, No coughing up of blood.No change in color of mucus.No wheezing.No chest wall deformity  Skin:  no rash or lesions.  GU:  no dysuria, change in color of urine, no urgency or frequency. No flank pain.  Musculoskeletal:  No joint pain or swelling. No decreased range of motion. No back pain.  Psych:  No change in mood or affect. No depression or anxiety. No memory loss.   Past Medical History  Diagnosis Date  . Hypertension   . Gout   . Arthritis   . History of radiation therapy 01/27/11-03/26/11    prostate  . Refusal of blood transfusions as patient is Jehovah's Witness   . Cataract     b/l catartact surgery  . Anemia     takes iron supplements  . Prostate cancer 07/14/10    dx  . Diabetes mellitus     adult onset   . Type II diabetes mellitus with nephropathy     Patient denies   History reviewed. No pertinent past surgical history. Social History:  reports that he quit smoking about 46 years ago. His smoking use included Cigarettes. He smoked 0.00 packs per day. He has never used smokeless tobacco. He reports that he drinks alcohol. He reports that he does not use illicit drugs.  No Known Allergies  Family History  Problem Relation Age of Onset  . Breast cancer Sister   . Breast cancer Paternal Aunt   . Colon cancer Neg Hx   . Diabetes Mother   . Diabetes Sister   . Diabetes Paternal Aunt   . Heart disease Father   .  Stroke Paternal Uncle   . Cancer Paternal Uncle     back     Prior to Admission medications   Medication Sig Start Date End Date Taking? Authorizing Provider  amLODipine (NORVASC) 10 MG tablet Take 10 mg by mouth every morning.   Yes Historical Provider, MD  Azilsartan Medoxomil (EDARBI) 80 MG TABS Take 80 mg by mouth daily.   Yes Historical Provider, MD  furosemide (LASIX) 40 MG tablet Take 40  mg by mouth daily as needed (For leg swelling.).   Yes Historical Provider, MD  pravastatin (PRAVACHOL) 40 MG tablet Take 40 mg by mouth. 04/13/13 04/13/17 Yes Historical Provider, MD  potassium chloride (K-DUR,KLOR-CON) 10 MEQ tablet Take 20 mEq by mouth daily as needed (For potassium loss.).     Historical Provider, MD  thiamine 100 MG tablet Take 1 tablet (100 mg total) by mouth daily. Patient not taking: Reported on 04/09/2014 12/17/12   Bonnielee Haff, MD   Physical Exam: Filed Vitals:   04/09/14 1141 04/09/14 1503  BP: 200/81 197/80  Pulse: 76 66  Temp: 97.7 F (36.5 C)   TempSrc: Oral   Resp: 18 20  SpO2: 98% 98%    Wt Readings from Last 3 Encounters:  04/09/14 119.75 kg (264 lb)  03/31/14 119.75 kg (264 lb)  12/23/13 111.721 kg (246 lb 4.8 oz)    General:  Elderly gentleman lying on gurney no acute cardiopulmonary distress. Speaking in full sentences.  Eyes: PERRLA, EOMI, normal lids, irises & conjunctiva ENT: grossly normal hearing, lips & tongue Neck: no LAD, masses or thyromegaly Cardiovascular: RRR, no m/r/g. 1+ bilateral LE edema left greater than right. Respiratory: CTA bilaterally, no w/r/r. Normal respiratory effort. Abdomen: soft, ntnd, positive bowel sounds, no rebound, no guarding Skin: no rash or induration seen on limited exam Musculoskeletal: grossly normal tone BUE/BLE Psychiatric: grossly normal mood and affect, speech fluent and appropriate Neurologic: Alert and oriented 3. Cranial nerves II through XII are grossly intact. Sensation is intact. Visual fields are intact. Finger-to-nose is intact. Unable to illicit reflexes symmetrically and diffusely. Gait not tested secondary to safety.           Labs on Admission:  Basic Metabolic Panel:  Recent Labs Lab 04/09/14 1212  NA 134*  K 4.3  CL 100  CO2 18*  GLUCOSE 199*  BUN 51*  CREATININE 2.85*  CALCIUM 8.7   Liver Function Tests:  Recent Labs Lab 04/09/14 1212  AST 11  ALT 11  ALKPHOS  96  BILITOT 0.4  PROT 7.2  ALBUMIN 3.1*   No results for input(s): LIPASE, AMYLASE in the last 168 hours. No results for input(s): AMMONIA in the last 168 hours. CBC:  Recent Labs Lab 04/09/14 1212  WBC 7.9  NEUTROABS 6.0  HGB 9.8*  HCT 30.7*  MCV 77.9*  PLT 319   Cardiac Enzymes: No results for input(s): CKTOTAL, CKMB, CKMBINDEX, TROPONINI in the last 168 hours.  BNP (last 3 results)  Recent Labs  04/09/14 1212  PROBNP 537.6*   CBG: No results for input(s): GLUCAP in the last 168 hours.  Radiological Exams on Admission: Ct Head Wo Contrast  04/09/2014   CLINICAL DATA:  Episode of blurred vision 1 week ago. Recurrent episode today with right arm and right leg weakness.  EXAM: CT HEAD WITHOUT CONTRAST  TECHNIQUE: Contiguous axial images were obtained from the base of the skull through the vertex without intravenous contrast.  COMPARISON:  None.  FINDINGS: No evidence of an acute infarct,  acute hemorrhage, mass lesion, mass effect or hydrocephalus. 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 Mild atrophy. Mild periventricular low attenuation. No air-fluid levels in the paranasal sinuses or mastoid air cells.  IMPRESSION: 1. No acute intracranial abnormality. 2. Mild atrophy and minimal chronic microvascular white matter ischemic changes.   Electronically Signed   By: Lorin Picket M.D.   On: 04/09/2014 15:16    EKG: Independently reviewed. Normal sinus rhythm with T-wave inversion in leads V5 through V6  Assessment/Plan Principal Problem:   TIA (transient ischemic attack) Active Problems:   Refusal of blood transfusions as patient is Jehovah's Witness   IgG monoclonal gammopathy   Hypertension   Type II diabetes mellitus with nephropathy   CKD (chronic kidney disease)   History of prostate cancer   Acute right-sided weakness   Right sided weakness   Abnormal EKG   #1 right-sided weakness/rule out TIA Patient is presenting  with right-sided weakness that resolved on the day of admission with some associated visual changes. This is secondary episode per family. Patient had a similar episode one week prior to admission. Patient does have a history of hypertension, probable type 2 diabetes although he denies this. Will admit the patient to telemetry and undergo a stroke workup. Check a head CT. Check MRI/MRA of the head. Check carotid Dopplers, 2-D echo. We will resume patient's home blood pressure medications. Check a fasting lipid panel. Check a hemoglobin A1c. We'll place patient on aspirin for secondary stroke prophylaxis. Consult with neurology for further evaluation and management.  #2 abnormal EKG Patient denies any chest pain or shortness of breath. Patient does have T-wave inversions in leads V5 through V6. We'll cycle cardiac enzymes every 6 hours 3. Check a 2-D echo. Check a fasting lipid panel. Continue home regimen of Norvasc, ARB, Pravachol. If enzymes are elevated a 2-D echo is abnormal may need a cardiology consultation.  #3 hypertension Patient noted to have a systolic blood pressure in the 200s. Due to concern for possible TIA would not aggressively decrease this over the next 24 hours. May decrease blood pressure by 25%. Will resume patient's home regimen of Norvasc and ARB. Hydralazine as needed.  #4 type 2 diabetes mellitus Patient denies any prior history of diabetes. Check a hemoglobin A1c. Patient had a hemoglobin A1c done on 12/14/2012 that was elevated at 8.9. Will place patient on sliding scale insulin. Check CBGs before meals and at bedtime. Consult with diabetic coordinator. Follow.  #5 history of prostate cancer Outpatient follow-up with urology.  #6 chronic kidney disease Stable. Follow.  #7 problem hyperlipidemia Check a fasting lipid panel. Continue home regimen of Pravachol.  #8 MGUS Outpatient follow-up with hematology.  #9 prophylaxis Protonix for GI prophylaxis. Heparin for DVT  prophylaxis.  Code Status: Full DVT Prophylaxis: Heparin Family Communication: Updated patient, wife, daughters at bedside. Disposition Plan: Admit to telemetry  Time spent: 65 minutes  [facility] MD [facility] Pager [phone]

## 2014-04-09 NOTE — Progress Notes (Signed)
Patient arrived from [facility] ED, report received from [place], [place]. At [facility] ED, pt's with elevated b/p and per Dirk Dress RN, MD is not treating this. Patient denied pain and appears in no distress at this time. Safety precautions and orders reviewed with patient/family. Patient verbalized to RN that he is unable to read but with help, he is able to repeats words without slurring. Bedside swallow eval completed without any difficulty. Diet ordered. B/P sremain elevated, Hydralazine administered per MAR. Will continue to monitor.   Ave Filter, RN

## 2014-04-09 NOTE — ED Provider Notes (Signed)
CSN: [id number]     Arrival date & time 04/09/14  1128 History   First MD Initiated Contact with Patient 04/09/14 1204     Chief Complaint  Patient presents with  . Weakness  . Blurred Vision     (Consider location/radiation/quality/duration/timing/severity/associated sxs/prior Treatment) HPI Comments: Intermittent episodes of blurry vision, R arm/leg weakness/heaviness over past week. Had blurry vision and R sided heaviness all at once today.  Episodes would all resolve after patient would sleep for a little bit. No symptoms at this time.  Patient is a 73 y.o. male presenting with neurologic complaint. The history is provided by the patient.  Neurologic Problem This is a recurrent problem. The current episode started more than 2 days ago. The problem occurs every several days. The problem has been gradually worsening. Pertinent negatives include no abdominal pain and no shortness of breath. Nothing aggravates the symptoms. Nothing relieves the symptoms.    Past Medical History  Diagnosis Date  . Hypertension   . Gout   . Arthritis   . Diabetes mellitus     adult onset   . History of radiation therapy 01/27/11-03/26/11    prostate  . Refusal of blood transfusions as patient is Jehovah's Witness   . Cataract     b/l catartact surgery  . Anemia     takes iron supplements  . Type II diabetes mellitus with nephropathy   . Prostate cancer 07/14/10    dx   History reviewed. No pertinent past surgical history. Family History  Problem Relation Age of Onset  . Breast cancer Sister   . Breast cancer Paternal Aunt   . Colon cancer Neg Hx   . Diabetes Mother   . Diabetes Sister   . Diabetes Paternal Aunt   . Heart disease Father   . Stroke Paternal Uncle   . Cancer Paternal Uncle     back   History  Substance Use Topics  . Smoking status: Former Smoker    Types: Cigarettes    Quit date: 04/24/1967  . Smokeless tobacco: Never Used  . Alcohol Use: 0.0 oz/week    4-5 Cans of  beer per week    Review of Systems  Constitutional: Negative for fever.  Eyes: Positive for visual disturbance.  Respiratory: Negative for cough and shortness of breath.   Gastrointestinal: Negative for vomiting and abdominal pain.  Neurological: Positive for weakness.  All other systems reviewed and are negative.     Allergies  Review of patient's allergies indicates no known allergies.  Home Medications   Prior to Admission medications   Medication Sig Start Date End Date Taking? Authorizing Provider  amLODipine (NORVASC) 10 MG tablet Take 10 mg by mouth every morning.   Yes Historical Provider, MD  Azilsartan Medoxomil (EDARBI) 80 MG TABS Take 80 mg by mouth daily.   Yes Historical Provider, MD  furosemide (LASIX) 40 MG tablet Take 40 mg by mouth daily as needed (For leg swelling.).   Yes Historical Provider, MD  pravastatin (PRAVACHOL) 40 MG tablet Take 40 mg by mouth. 04/13/13 04/13/17 Yes Historical Provider, MD  potassium chloride (K-DUR,KLOR-CON) 10 MEQ tablet Take 20 mEq by mouth daily as needed (For potassium loss.).     Historical Provider, MD  thiamine 100 MG tablet Take 1 tablet (100 mg total) by mouth daily. Patient not taking: Reported on 04/09/2014 12/17/12   Bonnielee Haff, MD   BP 200/81 mmHg  Pulse 76  Temp(Src) 97.7 F (36.5 C) (Oral)  Resp 18  SpO2 98% Physical Exam  Constitutional: He is oriented to person, place, and time. He appears well-developed and well-nourished. No distress.  HENT:  Head: Normocephalic and atraumatic.  Mouth/Throat: No oropharyngeal exudate.  Eyes: EOM are normal. Pupils are equal, round, and reactive to light.  Neck: Normal range of motion. Neck supple.  Cardiovascular: Normal rate and regular rhythm.  Exam reveals no friction rub.   No murmur heard. Pulmonary/Chest: Effort normal and breath sounds normal. No respiratory distress. He has no wheezes. He has no rales.  Abdominal: Soft. He exhibits no distension. There is no  tenderness. There is no rebound.  Musculoskeletal: Normal range of motion. He exhibits no edema.  Neurological: He is alert and oriented to person, place, and time. No cranial nerve deficit. He exhibits normal muscle tone. Coordination normal.  Skin: No rash noted. He is not diaphoretic.  Nursing note and vitals reviewed.   ED Course  Procedures (including critical care time) Labs Review Labs Reviewed  CBC WITH DIFFERENTIAL - Abnormal; Notable for the following:    RBC 3.94 (*)    Hemoglobin 9.8 (*)    HCT 30.7 (*)    MCV 77.9 (*)    MCH 24.9 (*)    RDW 17.6 (*)    All other components within normal limits  COMPREHENSIVE METABOLIC PANEL  PRO B NATRIURETIC PEPTIDE  URINE RAPID DRUG SCREEN (HOSP PERFORMED)  I-STAT TROPOININ, ED    Imaging Review No results found.   EKG Interpretation None      MDM   Final diagnoses:  Right sided weakness  Transient cerebral ischemia, unspecified transient cerebral ischemia type    73 year old male here with symptoms concerning for TIA. He's had multiple blurry vision episodes and last night had an episode with right arm and right leg heaviness. Blurry vision is binocular. All symptoms resolved now. Symptoms never lasted over one hour. History of high blood pressure, high cholesterol. Neuro exam nonfocal at this time. Here vitals stable. Normal neurologic exam. Neuro agreed to see the patient at [facility]. Labs show mildly elevated BNP. Creatinine 2.85, similar to baseline. Dr. Grandville Silos admitting. Patient transferred to [facility].   Evelina Bucy, MD 04/09/14 [phone]

## 2014-04-09 NOTE — ED Notes (Signed)
MD aware of pt blood pressure

## 2014-04-09 NOTE — ED Notes (Signed)
Pt had blurred vision episode 1 week ago.  Vision came back.  Today started having same blurred vision with right arm and right leg weakness.  Occurred at 3 am for about an hour.  Vision is normal again also.  Weakness is normal again.  Pt is on bp meds and cholesterol meds.  No change in speech or any confusion present during event

## 2014-04-09 NOTE — ED Notes (Signed)
Bed: WA21 Expected date:  Expected time:  Means of arrival:  Comments: For [place], triage 6

## 2014-04-10 ENCOUNTER — Observation Stay (HOSPITAL_COMMUNITY): Payer: Medicare Other

## 2014-04-10 DIAGNOSIS — D638 Anemia in other chronic diseases classified elsewhere: Secondary | ICD-10-CM | POA: Diagnosis present

## 2014-04-10 DIAGNOSIS — Z8546 Personal history of malignant neoplasm of prostate: Secondary | ICD-10-CM

## 2014-04-10 DIAGNOSIS — I5032 Chronic diastolic (congestive) heart failure: Secondary | ICD-10-CM | POA: Diagnosis present

## 2014-04-10 DIAGNOSIS — Z79899 Other long term (current) drug therapy: Secondary | ICD-10-CM | POA: Diagnosis not present

## 2014-04-10 DIAGNOSIS — Z6835 Body mass index (BMI) 35.0-35.9, adult: Secondary | ICD-10-CM | POA: Diagnosis not present

## 2014-04-10 DIAGNOSIS — D472 Monoclonal gammopathy: Secondary | ICD-10-CM

## 2014-04-10 DIAGNOSIS — R531 Weakness: Secondary | ICD-10-CM | POA: Diagnosis present

## 2014-04-10 DIAGNOSIS — I059 Rheumatic mitral valve disease, unspecified: Secondary | ICD-10-CM

## 2014-04-10 DIAGNOSIS — I639 Cerebral infarction, unspecified: Secondary | ICD-10-CM

## 2014-04-10 DIAGNOSIS — N189 Chronic kidney disease, unspecified: Secondary | ICD-10-CM | POA: Diagnosis present

## 2014-04-10 DIAGNOSIS — Z823 Family history of stroke: Secondary | ICD-10-CM | POA: Diagnosis not present

## 2014-04-10 DIAGNOSIS — Z531 Procedure and treatment not carried out because of patient's decision for reasons of belief and group pressure: Secondary | ICD-10-CM | POA: Diagnosis present

## 2014-04-10 DIAGNOSIS — I129 Hypertensive chronic kidney disease with stage 1 through stage 4 chronic kidney disease, or unspecified chronic kidney disease: Secondary | ICD-10-CM | POA: Diagnosis present

## 2014-04-10 DIAGNOSIS — Z87891 Personal history of nicotine dependence: Secondary | ICD-10-CM | POA: Diagnosis not present

## 2014-04-10 DIAGNOSIS — Z9119 Patient's noncompliance with other medical treatment and regimen: Secondary | ICD-10-CM | POA: Diagnosis present

## 2014-04-10 DIAGNOSIS — G459 Transient cerebral ischemic attack, unspecified: Secondary | ICD-10-CM | POA: Diagnosis present

## 2014-04-10 DIAGNOSIS — R9431 Abnormal electrocardiogram [ECG] [EKG]: Secondary | ICD-10-CM | POA: Diagnosis present

## 2014-04-10 DIAGNOSIS — M109 Gout, unspecified: Secondary | ICD-10-CM | POA: Diagnosis present

## 2014-04-10 DIAGNOSIS — E669 Obesity, unspecified: Secondary | ICD-10-CM | POA: Diagnosis present

## 2014-04-10 DIAGNOSIS — Z8249 Family history of ischemic heart disease and other diseases of the circulatory system: Secondary | ICD-10-CM | POA: Diagnosis not present

## 2014-04-10 DIAGNOSIS — E785 Hyperlipidemia, unspecified: Secondary | ICD-10-CM | POA: Diagnosis present

## 2014-04-10 DIAGNOSIS — E1121 Type 2 diabetes mellitus with diabetic nephropathy: Secondary | ICD-10-CM | POA: Diagnosis present

## 2014-04-10 DIAGNOSIS — Z833 Family history of diabetes mellitus: Secondary | ICD-10-CM | POA: Diagnosis not present

## 2014-04-10 DIAGNOSIS — G8191 Hemiplegia, unspecified affecting right dominant side: Secondary | ICD-10-CM | POA: Diagnosis present

## 2014-04-10 DIAGNOSIS — Z923 Personal history of irradiation: Secondary | ICD-10-CM | POA: Diagnosis not present

## 2014-04-10 DIAGNOSIS — M1990 Unspecified osteoarthritis, unspecified site: Secondary | ICD-10-CM | POA: Diagnosis present

## 2014-04-10 DIAGNOSIS — C61 Malignant neoplasm of prostate: Secondary | ICD-10-CM | POA: Diagnosis present

## 2014-04-10 LAB — GLUCOSE, CAPILLARY
Glucose-Capillary: 141 mg/dL — ABNORMAL HIGH (ref 70–99)
Glucose-Capillary: 169 mg/dL — ABNORMAL HIGH (ref 70–99)
Glucose-Capillary: 160 mg/dL — ABNORMAL HIGH (ref 70–99)
Glucose-Capillary: 187 mg/dL — ABNORMAL HIGH (ref 70–99)
Glucose-Capillary: 152 mg/dL — ABNORMAL HIGH (ref 70–99)

## 2014-04-10 LAB — BASIC METABOLIC PANEL
ANION GAP: 16 — AB (ref 5–15)
BUN: 50 mg/dL — ABNORMAL HIGH (ref 6–23)
CALCIUM: 8.3 mg/dL — AB (ref 8.4–10.5)
CO2: 18 meq/L — AB (ref 19–32)
Chloride: 102 mEq/L (ref 96–112)
Creatinine, Ser: 2.59 mg/dL — ABNORMAL HIGH (ref 0.50–1.35)
GFR, EST AFRICAN AMERICAN: 26 mL/min — AB (ref >90)
GFR, EST NON AFRICAN AMERICAN: 23 mL/min — AB (ref >90)
Glucose, Bld: 154 mg/dL — ABNORMAL HIGH (ref 70–99)
Potassium: 4.2 mEq/L (ref 3.7–5.3)
SODIUM: 136 meq/L — AB (ref 137–147)

## 2014-04-10 LAB — LIPID PANEL
CHOL/HDL RATIO: 5 ratio
Cholesterol: 191 mg/dL (ref 0–200)
HDL: 38 mg/dL — AB (ref >39)
LDL CALC: 122 mg/dL — AB (ref 0–99)
Triglycerides: 157 mg/dL — ABNORMAL HIGH (ref <150)
VLDL: 31 mg/dL (ref 0–40)

## 2014-04-10 LAB — HEMOGLOBIN A1C
Hgb A1c MFr Bld: 9.3 % — ABNORMAL HIGH (ref <5.7)
Mean Plasma Glucose: 220 mg/dL — ABNORMAL HIGH (ref <117)

## 2014-04-10 LAB — TROPONIN I
Troponin I: <0.3 ng/mL (ref <0.30)
Troponin I: <0.3 ng/mL (ref <0.30)

## 2014-04-10 LAB — CBC
HCT: 29.2 % — ABNORMAL LOW (ref 39.0–52.0)
Hemoglobin: 9.6 g/dL — ABNORMAL LOW (ref 13.0–17.0)
MCH: 25.5 pg — ABNORMAL LOW (ref 26.0–34.0)
MCHC: 32.9 g/dL (ref 30.0–36.0)
MCV: 77.7 fL — ABNORMAL LOW (ref 78.0–100.0)
Platelets: 273 10*3/uL (ref 150–400)
RBC: 3.76 MIL/uL — ABNORMAL LOW (ref 4.22–5.81)
RDW: 17.7 % — AB (ref 11.5–15.5)
WBC: 8.3 10*3/uL (ref 4.0–10.5)

## 2014-04-10 MED ORDER — ATORVASTATIN CALCIUM 40 MG PO TABS
40.0000 mg | ORAL_TABLET | Freq: Every day | ORAL | Status: DC
  Administered 2014-04-11: 40 mg via ORAL
  Filled 2014-04-10 (×2): qty 1

## 2014-04-10 MED ORDER — HYDRALAZINE HCL 20 MG/ML IJ SOLN
10.0000 mg | Freq: Once | INTRAMUSCULAR | Status: AC
  Administered 2014-04-10: 10 mg via INTRAVENOUS

## 2014-04-10 NOTE — Progress Notes (Signed)
Occupational Therapy Evaluation Patient Details Name: Roger Wood MRN: [id number] DOB: [birth year] Today's Date: 04/10/2014    History of Present Illness Patient is a 73 y/o male admitted after two transient events, one of right sided weakness and one of blurred vision. CT (-). 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    Clinical Impression   PTA, pt mod i with mobility and ADL. Pt appears to be functioning at his baseline level at this time. Educated pt/family on signs/symptoms of CVA. Pt verbalized understanding. No further OT indicated at this time. Pt safe to D/C home with family when medically stable.     Follow Up Recommendations  No OT follow up;Supervision - Intermittent    Equipment Recommendations  None recommended by OT    Recommendations for Other Services       Precautions / Restrictions Precautions Precautions: None Restrictions Weight Bearing Restrictions: No      Mobility  Transfers Overall transfer level: Modified independent              Balance Overall balance assessment: No apparent balance deficits (not formally assessed) Sitting-balance support: Feet supported;No upper extremity supported Sitting balance-Leahy Scale: Good     Standing balance support: During functional activity Standing balance-Leahy Scale: Fair                             ADL Overall ADL's : At baseline                                             Vision Eye Alignment: Within Functional Limits Alignment/Gaze Preference: Within Defined Limits Ocular Range of Motion: Within Functional Limits Tracking/Visual Pursuits: Able to  track stimulus in all quads without difficulty Saccades: Within functional limits Convergence: Within functional limits         Perception     Praxis Praxis Praxis tested?: Within functional limits    Pertinent Vitals/Pain Pain Assessment: No/denies pain     Hand Dominance Right   Extremity/Trunk Assessment Upper Extremity Assessment Upper Extremity Assessment: Overall WFL for tasks assessed   Lower Extremity Assessment Lower Extremity Assessment: Defer to PT evaluation LLE Deficits / Details: Not able to obtain full knee extension passively or actively - premorbid.    Cervical / Trunk Assessment Cervical / Trunk Assessment: Normal   Communication Communication Communication: No difficulties   Cognition Arousal/Alertness: Awake/alert Behavior During Therapy: WFL for tasks assessed/performed Overall Cognitive Status: Within Functional Limits for tasks assessed                     General Comments   Educated on CVA warning signs using "FAST"    Exercises       Shoulder Instructions      Home Living Family/patient expects to be discharged to:: Private residence Living Arrangements: Spouse/significant other Available Help at Discharge: Family;Available 24 hours/day Type of Home: House Home Access: Stairs to enter [employer] of Steps: 2 Entrance Stairs-Rails: Right Home Layout: One level     Bathroom Shower/Tub: Tub/shower unit Shower/tub characteristics: Architectural technologist:  Handicapped height Bathroom Accessibility: Yes How Accessible: Accessible via walker Home Equipment: Crutches;Walker - 2 wheels;Bedside commode;Wheelchair - manual      Lives With: Spouse    Prior Functioning/Environment Level of Independence: Independent with assistive device(s)        Comments: Furniture walker for household distances and uses crutches for community ambulation. No falls reported. Cares for wife- Mod I for ADls and IADLs - cooks, cleans  and drives.     OT Diagnosis:     OT Problem List:     OT Treatment/Interventions:      OT Goals(Current goals can be found in the care plan section) Acute Rehab OT Goals Patient Stated Goal: to go home OT Goal Formulation:  (eval only)  OT Frequency:     Barriers to D/C:            Co-evaluation              End of Session Nurse Communication: Mobility status  Activity Tolerance: Patient tolerated treatment well Patient left: in chair;with call bell/phone within reach;with family/visitor present   Time: 8657-8469 OT Time Calculation (min): 15 min Charges:  OT General Charges $OT Visit: 1 Procedure OT Evaluation $Initial OT Evaluation Tier I: 1 Procedure OT Treatments $Self Care/Home Management : 8-22 mins G-Codes:    Danell Vazquez,HILLARY 04-13-14, 3:36 PM   [facility], OTR/L  [phone] 04/13/14

## 2014-04-10 NOTE — Progress Notes (Signed)
MD made aware of high BP, new orders given. Will continue to monitor.   Fredrich Romans, RN 04/10/2014 3:09 AM

## 2014-04-10 NOTE — Evaluation (Signed)
Speech Language Pathology Evaluation Patient Details Name: Roger Wood MRN: [id number] DOB: [birth year] Today's Date: 04/10/2014 Time: 9166-0600 SLP Time Calculation (min) (ACUTE ONLY): 24 min  Problem List:  Patient Active Problem List   Diagnosis Date Noted  . TIA (transient ischemic attack) 04/09/2014  . Acute right-sided weakness 04/09/2014  . Right sided weakness 04/09/2014  . Abnormal EKG 04/09/2014  . History of prostate cancer 08/25/2013  . Anemia of other chronic disease 02/25/2013  . CKD (chronic kidney disease) 12/18/2012  . Acute renal failure 12/15/2012  . Gout attack 12/15/2012  . Diabetes 12/15/2012  . Diabetes mellitus   . Anemia   . Anemia, unspecified 12/20/2010  . Anorexia 12/20/2010  . Refusal of blood transfusions as patient is Jehovah's Witness 12/20/2010  . IgG monoclonal gammopathy 12/20/2010  . Hypertension 12/20/2010  . Type II diabetes mellitus with nephropathy 12/20/2010   Past Medical History:  Past Medical History  Diagnosis Date  . Hypertension   . Gout   . Arthritis   . History of radiation therapy 01/27/11-03/26/11    prostate  . Refusal of blood transfusions as patient is Jehovah's Witness   . Cataract     b/l catartact surgery  . Anemia     takes iron supplements  . Prostate cancer 07/14/10    dx  . Diabetes mellitus     adult onset   . Type II diabetes mellitus with nephropathy     Patient denies   Past Surgical History: History reviewed. No pertinent past surgical history. HPI:  Patient is a 73 y/o male admitted after two transient events, one of right sided weakness and one of blurred vision. CT (-). 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    Assessment / Plan / Recommendation Clinical Impression  Pt exhibits decreased selective attention and working memory, which impacts accuracy with mildly complex tasks. Per patient and family, this is baseline level of function for him and all symptoms have resolved. Given that he is at his baseline do not recommend further SLP services, however recommend 24/7 supervision upon initial return home.    SLP Assessment  Patient does not need any further Speech Lanaguage Pathology Services    Follow Up Recommendations  None;24 hour supervision/assistance    Frequency and Duration        Pertinent Vitals/Pain Pain Assessment: No/denies pain   SLP Goals  Patient/Family Stated Goal: none stated  SLP Evaluation Prior Functioning  Cognitive/Linguistic Baseline: Within functional limits Type of Home: House  Lives With: Spouse Available Help at Discharge: Family;Available 24 hours/day Vocation: Retired   Associate Professor  Overall Cognitive Status:  (at baseline per family) Arousal/Alertness: Awake/alert Orientation Level: Oriented X4 Attention: Sustained;Selective Sustained Attention: Appears intact Selective Attention: Impaired Selective Attention Impairment: Verbal basic;Verbal complex Memory: Impaired Memory Impairment: Other (comment) (working memory) Awareness: Impaired Awareness Impairment: Emergent impairment;Anticipatory impairment Problem Solving: Appears intact Safety/Judgment: Appears intact    Comprehension  Auditory Comprehension Overall Auditory Comprehension: Impaired Yes/No Questions: Impaired Complex Questions: 75-100% accurate Paragraph Comprehension (via yes/no questions): 0-25% accurate Commands: Within Functional Limits Conversation: Simple Interfering Components: Attention;Working Curator: Within [employer] Reading Comprehension Reading Status: Unable to assess (comment) (pt does not have  glasses)  Expression Expression Primary Mode of Expression: Verbal Verbal Expression Overall Verbal Expression: Appears within functional limits for tasks assessed Written Expression Dominant Hand: Right Written Expression: Not tested   Oral / Motor Motor Speech Overall Motor Speech: Appears within functional limits for tasks assessed   GO      Germain Osgood, M.A. CCC-SLP [phone]  Germain Osgood 04/10/2014, 2:32 PM

## 2014-04-10 NOTE — Progress Notes (Signed)
BP has been elevated and latest BP was 193/75. Hydralazine administered per MAR. Will continue to monitor.    Fredrich Romans, RN 04/10/2014 2:16 AM

## 2014-04-10 NOTE — Progress Notes (Signed)
[facility] PROGRESS NOTE  Roger Wood:[id number] DOB: [birth year] DOA: 04/09/2014 PCP: Gearlean Alf., PA-C  Assessment/Plan: 1. 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 2d echo pending 2. Carotid dopplers without stenosis 3. Neurology following, appreciate input 4. No PT f/u per therapy recs 2. Twave inversions of V5-6 1. Asymptomatic 2. Will repeat ekg in AM 3. 2d echo pending 3. DM2 1. On SSI coverage 2. Cont for now 4. Prostate Cancer 1. Followed by Urology 5. CKD 1. Cr appears stable and at baseline 2. Monitor closely 3. Currently Cr of 2.59 6. HLD 1. On lipitor 7. MGUS 1. Followed by Hematology 8. DVT prophylaxis  Code Status: full Family Communication: Pt in room, family at bedside (indicate person spoken with, relationship, and if by phone, the number) Disposition Plan: pending   Consultants:    Procedures:    Antibiotics:   (indicate start date, and stop date if known)  HPI/Subjective: Eager to go home. No longer symptomatic this AM  Objective: Filed Vitals:   04/10/14 0355 04/10/14 0531 04/10/14 0958 04/10/14 1502  BP: 172/55 179/67 169/57 176/67  Pulse: 85 88 88 79  Temp: 98.1 F (36.7 C) 98.2 F (36.8 C) 97.7 F (36.5 C) 97.8 F (36.6 C)  TempSrc: Oral Oral Oral Oral  Resp: 18 18 19 18   Height:      Weight:      SpO2: 100% 97% 99% 98%    Intake/Output Summary (Last 24 hours) at 04/10/14 1530 Last data filed at 04/10/14 0959  Gross per 24 hour  Intake    360 ml  Output   1900 ml  Net  -1540 ml   Filed Weights   04/09/14 1637  Weight: 115 kg (253 lb 8.5 oz)    Exam:   General:  Awake, in nad  Cardiovascular: regular, s1, s2  Respiratory: normal resp effort, no wheezing  Abdomen: soft,nondistended  Musculoskeletal: perfused, no clubbing   Data Reviewed: Basic Metabolic Panel:  Recent Labs Lab 04/09/14 1212 04/09/14 1727  04/10/14 0447  NA 134*  --  136*  K 4.3  --  4.2  CL 100  --  102  CO2 18*  --  18*  GLUCOSE 199*  --  154*  BUN 51*  --  50*  CREATININE 2.85* 2.65* 2.59*  CALCIUM 8.7  --  8.3*   Liver Function Tests:  Recent Labs Lab 04/09/14 1212  AST 11  ALT 11  ALKPHOS 96  BILITOT 0.4  PROT 7.2  ALBUMIN 3.1*   No results for input(s): LIPASE, AMYLASE in the last 168 hours. No results for input(s): AMMONIA in the last 168 hours. CBC:  Recent Labs Lab 04/09/14 1212 04/09/14 1727 04/10/14 0447  WBC 7.9 7.4 8.3  NEUTROABS 6.0  --   --   HGB 9.8* 10.0* 9.6*  HCT 30.7* 30.9* 29.2*  MCV 77.9* 76.1* 77.7*  PLT 319 291 273   Cardiac Enzymes:  Recent Labs Lab 04/09/14 1727 04/09/14 2325 04/10/14 0447  TROPONINI <0.30 <0.30 <0.30   BNP (last 3 results)  Recent Labs  04/09/14 1212  PROBNP 537.6*   CBG:  Recent Labs Lab 04/09/14 1720 04/09/14 2138 04/10/14 0645 04/10/14 1220  GLUCAP 141* 255* 169* 160*    No results found for this or any previous visit (from the past 240 hour(s)).   Studies: Dg Chest 2 View  04/10/2014   CLINICAL DATA:  Shortness of breath  and weakness. History of hypertension. Initial encounter.  EXAM: CHEST  2 VIEW  COMPARISON:  12/17/2012 chest radiograph.  FINDINGS: Cardiomegaly is redemonstrated. Mild vascular congestion without overt failure or focal infiltrates. No significant effusion or pneumothorax.  IMPRESSION: Cardiomegaly with mild vascular congestion. Slight improvement aeration compared with priors.   Electronically Signed   By: Rolla Flatten M.D.   On: 04/10/2014 01:03   Ct Head Wo Contrast  04/09/2014   CLINICAL DATA:  Episode of blurred vision 1 week ago. Recurrent episode today with right arm and right leg weakness.  EXAM: CT HEAD WITHOUT CONTRAST  TECHNIQUE: Contiguous axial images were obtained from the base of the skull through the vertex without intravenous contrast.  COMPARISON:  None.  FINDINGS: No evidence of an acute  infarct, acute hemorrhage, mass lesion, mass effect or hydrocephalus. 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 Mild atrophy. Mild periventricular low attenuation. No air-fluid levels in the paranasal sinuses or mastoid air cells.  IMPRESSION: 1. No acute intracranial abnormality. 2. Mild atrophy and minimal chronic microvascular white matter ischemic changes.   Electronically Signed   By: Lorin Picket M.D.   On: 04/09/2014 15:16   Mr Brain Wo Contrast  04/10/2014   CLINICAL DATA:  Initial evaluation for weakness, visual changes.  EXAM: MRI HEAD WITHOUT CONTRAST  MRA HEAD WITHOUT CONTRAST  TECHNIQUE: Multiplanar, multiecho pulse sequences of the brain and surrounding structures were obtained without intravenous contrast. Angiographic images of the head were obtained using MRA technique without contrast.  COMPARISON:  Prior CT from earlier the same day.  FINDINGS: MRI HEAD FINDINGS  Mild diffuse prominence of the CSF containing spaces is compatible with generalized age-related cerebral atrophy. No significant small vessel ischemic type changes identified within the cerebral white matter. No mass lesion, mass effect, or midline shift. Ventricles are normal in size without evidence of hydrocephalus. There is no extra-axial fluid collection.  Multiple small foci of abnormal restricted diffusion seen involving the bilateral cerebellar hemispheres, left greater than right. Findings are consistent with acute ischemic infarcts. For reference purposes, the largest foci within the left cerebellar hemisphere measures 8 mm. The there is a single focus within the right cerebellar hemisphere measuring 9 mm. There are additional small ischemic infarcts more cephalad within the left occipital lobe (series 4, image 17). Additional 11 mm infarct present within the left parietal lobe (series 4, image 24). Tiny cortical infarcts seen more superiorly (series 4, image 28). There is question of  a tiny subacute infarct within the deep white matter of the left frontal lobe (series 4, image 31). No associated hemorrhage or mass effect. No infarcts involving the basal ganglia or brainstem identified.  Craniocervical junction within normal limits. Pituitary gland unremarkable.  No acute abnormality seen about the orbits.  Paranasal sinuses and mastoid air cells are clear.  Visualized bone marrow signal intensity is normal. Scalp soft tissues unremarkable.  MRA HEAD FINDINGS  ANTERIOR CIRCULATION:  The visualized portions of the distal cervical segments of the internal carotid arteries are widely patent with antegrade flow. The petrous segments are widely patent. Mild multi focal atherosclerotic irregularity present within the cavernous ICAs bilaterally without significant stenosis. Supra clinoid segments widely patent.  A1 segments, anterior communicating artery, and anterior cerebral arteries are widely patent.  M1 segments patent without proximal branch occlusion. There is a focal moderate stenosis within the distal right M1 segment at/just prior to the bifurcation (Series 6, image 62). Left M1 segment widely  patent. Distal MCA branches well opacified and symmetric in appearance. No item to or M3 branch occlusion.  POSTERIOR CIRCULATION:  The left vertebral artery is dominant. The right vertebral artery is not well visualized, and appears to terminate in the right pica. The posterior inferior cerebral arteries themselves are patent. Mild multi focal atherosclerotic irregularity present within the distal left vertebral artery without significant stenosis. There is multi focal atherosclerotic irregularity with mild to moderate stenosis in the proximal basilar artery. The basilar artery itself is diminutive. There is prominent atheromatous irregularity within the superior cerebellar arteries, left slightly worse than right. There is an apparent high-grade stenosis of the left P1 segment. Small left posterior  communicating artery present. Distally, the left P2 segment demonstrates mild multi focal atherosclerotic irregularity without significant stenosis. The right P2 segment is diminutive with atherosclerotic irregularity. A right posterior communicating artery is present. Right P2 segment opacified to its distal branches.  No aneurysm or vascular malformation.  IMPRESSION: MRI HEAD IMPRESSION:  1. 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 No associated hemorrhage or significant mass effect. 2. No other acute intracranial process.  MRA HEAD IMPRESSION:  1. 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 2. Diminutive and irregular P1 segments bilaterally with associated moderate to severe stenosis. These changes are worse on the left. Small posterior communicating arteries are present bilaterally. 3. Nonvisualization of the right vertebral artery, which is suspected to terminate in PICA. 4. Moderate short-segment stenosis of the distal right M1 segment at/just prior to the MCA bifurcation.   Electronically Signed   By: Jeannine Boga M.D.   On: 04/10/2014 01:20   Mr Jodene Nam Head/brain Wo Cm  04/10/2014   CLINICAL DATA:  Initial evaluation for weakness, visual changes.  EXAM: MRI HEAD WITHOUT CONTRAST  MRA HEAD WITHOUT CONTRAST  TECHNIQUE: Multiplanar, multiecho pulse sequences of the brain and surrounding structures were obtained without intravenous contrast. Angiographic images of the head were obtained using MRA technique without contrast.  COMPARISON:  Prior CT from earlier the same day.  FINDINGS: MRI HEAD FINDINGS  Mild diffuse prominence of the CSF containing spaces is compatible with generalized age-related cerebral atrophy. No significant small vessel ischemic type changes identified within the cerebral white  matter. No mass lesion, mass effect, or midline shift. Ventricles are normal in size without evidence of hydrocephalus. There is no extra-axial fluid collection.  Multiple small foci of abnormal restricted diffusion seen involving the bilateral cerebellar hemispheres, left greater than right. Findings are consistent with acute ischemic infarcts. For reference purposes, the largest foci within the left cerebellar hemisphere measures 8 mm. The there is a single focus within the right cerebellar hemisphere measuring 9 mm. There are additional small ischemic infarcts more cephalad within the left occipital lobe (series 4, image 17). Additional 11 mm infarct present within the left parietal lobe (series 4, image 24). Tiny cortical infarcts seen more superiorly (series 4, image 28). There is question of a tiny subacute infarct within the deep white matter of the left frontal lobe (series 4, image 31). No associated hemorrhage or mass effect. No infarcts involving the basal ganglia or brainstem identified.  Craniocervical junction within normal limits. Pituitary gland unremarkable.  No acute abnormality seen about the orbits.  Paranasal sinuses and mastoid air cells are clear.  Visualized bone marrow signal intensity is normal. Scalp soft tissues unremarkable.  MRA HEAD FINDINGS  ANTERIOR CIRCULATION:  The visualized portions of the distal cervical segments of the internal carotid arteries are widely patent with antegrade flow. The petrous segments are widely patent. Mild multi focal atherosclerotic irregularity present within the cavernous ICAs bilaterally without significant stenosis. Supra clinoid segments widely patent.  A1 segments, anterior communicating artery, and anterior cerebral arteries are widely patent.  M1 segments patent without proximal branch occlusion. There is a focal moderate stenosis within the distal right M1 segment at/just prior to the bifurcation (Series 6, image 62). Left M1 segment widely  patent. Distal MCA branches well opacified and symmetric in appearance. No item to or M3 branch occlusion.  POSTERIOR CIRCULATION:  The left vertebral artery is dominant. The right vertebral artery is not well visualized, and appears to terminate in the right pica. The posterior inferior cerebral arteries themselves are patent. Mild multi focal atherosclerotic irregularity present within the distal left vertebral artery without significant stenosis. There is multi focal atherosclerotic irregularity with mild to moderate stenosis in the proximal basilar artery. The basilar artery itself is diminutive. There is prominent atheromatous irregularity within the superior cerebellar arteries, left slightly worse than right. There is an apparent high-grade stenosis of the left P1 segment. Small left posterior communicating artery present. Distally, the left P2 segment demonstrates mild multi focal atherosclerotic irregularity without significant stenosis. The right P2 segment is diminutive with atherosclerotic irregularity. A right posterior communicating artery is present. Right P2 segment opacified to its distal branches.  No aneurysm or vascular malformation.  IMPRESSION: MRI HEAD IMPRESSION:  1. 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 No associated hemorrhage or significant mass effect. 2. No other acute intracranial process.  MRA HEAD IMPRESSION:  1. 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 2. Diminutive and irregular P1 segments bilaterally with associated moderate to severe stenosis. These changes are worse on the left. Small posterior communicating arteries are present bilaterally. 3. Nonvisualization of the right vertebral artery, which is suspected to terminate in PICA. 4. Moderate short-segment stenosis of the  distal right M1 segment at/just prior to the MCA bifurcation.   Electronically Signed   By: Jeannine Boga M.D.   On: 04/10/2014 01:20    Scheduled Meds: . amLODipine  10 mg Oral q morning - 10a  . aspirin  300 mg Rectal Daily   Or  . aspirin  325 mg Oral Daily  . atorvastatin  40 mg Oral q1800  . heparin  5,000 Units Subcutaneous 3 times per day  . insulin aspart  0-15 Units Subcutaneous TID WC  . irbesartan  300 mg Oral Daily  . pantoprazole  40 mg Oral Q0600   Continuous Infusions: . sodium chloride 75 mL/hr at 04/09/14 1643    Principal Problem:   TIA (transient ischemic attack) Active Problems:   Refusal of blood transfusions as patient is Jehovah's Witness   IgG monoclonal gammopathy   Hypertension   Type II diabetes mellitus with nephropathy   CKD (chronic kidney disease)   History of prostate cancer   Acute right-sided weakness   Right sided weakness   Abnormal EKG    Time spent: 37min    Lynnix Schoneman, [facility] Pager [phone]. If 7PM-7AM, please contact night-coverage at [email or web address], password [facility] 04/10/2014, 3:30 PM  LOS: 1 day

## 2014-04-10 NOTE — Evaluation (Signed)
Physical Therapy Evaluation Patient Details Name: Roger Wood MRN: [id number] DOB: [birth year] Today's Date: 04/10/2014   History of Present Illness  Patient is a 73 y/o male admitted after two transient events, one of right sided weakness and one of blurred vision. CT (-). 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     Clinical Impression  Patient presents close to functional baseline and able to tolerate negotiating steps and ambulating community distances with supervision and use of crutches for support. Initially, required support for balance due to stiffness in left knee however with increased distance, balance and mobility improved. Pt reports no deficits and that all symptoms have resolved. Pt does not require skilled therapy services. Discharge from therapy. Encourage mobility with RN while in hospital.   Follow Up Recommendations No PT follow up;Supervision - Intermittent    Equipment Recommendations  None recommended by PT    Recommendations for Other Services       Precautions / Restrictions Precautions Precautions: None Restrictions Weight Bearing Restrictions: No      Mobility  Bed Mobility Overal bed mobility: Needs Assistance Bed Mobility: Supine to Sit     Supine to sit: Modified independent (Device/Increase time)     General bed mobility comments: HOb flat, no rails. No physical assist needed.  Transfers Overall transfer level: Needs assistance Equipment used: None Transfers: Sit to/from Stand Sit to Stand: Min guard         General transfer comment: Min guard to stand from EOB due to stiffness in left knee. Used IV pole for support.    Ambulation/Gait Ambulation/Gait assistance: Supervision Ambulation Distance (Feet): 200 Feet Assistive device: Crutches;None Gait Pattern/deviations: Step-through pattern;Decreased stride length;Decreased dorsiflexion - left;Trunk flexed   Gait velocity interpretation: Below normal speed for age/gender General Gait Details: No heel strike on left during initial contact. Ambulates on toes on left - pt reports as premorbid, improves in shoes. Supervision for balance using IV pole for support progressing to Mod I using crutches with increased distance.  Stairs Stairs: Yes Stairs assistance: Supervision Stair Management: Two rails;Step to pattern Number of Stairs: 2 General stair comments: Supervsion for safety. No LOB.  Wheelchair Mobility    Modified Rankin (Stroke Patients Only) Modified Rankin (Stroke Patients Only) Pre-Morbid Rankin Score: No significant disability Modified Rankin: No significant disability     Balance Overall balance assessment: Needs assistance Sitting-balance support: Feet supported;No upper extremity supported Sitting balance-Leahy Scale: Good     Standing balance support: During functional activity Standing balance-Leahy Scale: Fair Standing balance comment: Able to perform dynamic standing activities- washing hands and toileting without UE support.                              Pertinent Vitals/Pain Pain Assessment: No/denies pain    Home Living Family/patient expects to be discharged to:: Private residence Living Arrangements: Spouse/significant other Available Help at Discharge: Family;Available 24 hours/day Type of Home: House Home Access: Stairs to enter Entrance Stairs-Rails: Right Entrance Stairs-Number of Steps: 2 Home Layout: One level Home Equipment: Crutches;Walker - 2 wheels;Bedside commode;Wheelchair - manual      Prior Function Level of Independence: Independent with assistive device(s)  Comments:  Furniture walker for household distances and uses crutches for community ambulation. No falls reported. Cares for wife- Mod I for ADls and IADLs - cooks, cleans and drives.      Hand Dominance   Dominant Hand: Right    Extremity/Trunk Assessment   Upper Extremity Assessment: Defer to OT evaluation (Mildly weaker grip in RUE vs LUE but functional.)           Lower Extremity Assessment: LLE deficits/detail (Sensation WFL.)   LLE Deficits / Details: Not able to obtain full knee extension passively or actively - premorbid.      Communication   Communication: No difficulties  Cognition Arousal/Alertness: Awake/alert Behavior During Therapy: WFL for tasks assessed/performed Overall Cognitive Status: Within Functional Limits for tasks assessed                      General Comments      Exercises        Assessment/Plan    PT Assessment Patent does not need any further PT services  PT Diagnosis Difficulty walking   PT Problem List    PT Treatment Interventions     PT Goals (Current goals can be found in the Care Plan section) Acute Rehab PT Goals PT Goal Formulation: All assessment and education complete, DC therapy    Frequency     Barriers to discharge        Co-evaluation               End of Session Equipment Utilized During Treatment: Gait belt Activity Tolerance: Patient tolerated treatment well Patient left: in chair;with call bell/phone within reach;with family/visitor present Nurse Communication: Mobility status         Time: 1141-1210 PT Time Calculation (min) (ACUTE ONLY): 29 min   Charges:   PT Evaluation $Initial PT Evaluation Tier I: 1 Procedure PT Treatments $Gait Training: 8-22 mins   PT G CodesCandy Sledge A 04/10/2014, 1:07 PM Candy Sledge, [place], DPT [phone]

## 2014-04-10 NOTE — Progress Notes (Signed)
STROKE TEAM PROGRESS NOTE   HISTORY Roger Wood is an 73 y.o. male with multiple medical problems who reports that early this morning while up watching television had the acute onset of right sided weakness. He reports being able to work himself to the bedroom where he went to sleep. When he awakened his symptoms had resolved but he decided to present for evaluation.  The patient also reports having an episode while driving about a week ago when he had blurry vision. Onset was acute. When he closed one eye the vision was normal. He drove the entire way home with just one eye open. He then went to sleep and when he awakened his vision had returned to normal.   Date last known well: Date: 04/09/2014 Time last known well: Time: 04:00 tPA Given: No: Resolution of symptoms   SUBJECTIVE (INTERVAL HISTORY) The patient is ambulating in the hallways with his therapist. His wife reports that he has not been compliant with all of his medications secondary to financial concerns.   OBJECTIVE Temp:  [97.6 F (36.4 C)-98.7 F (37.1 C)] 98.2 F (36.8 C) (12/19 0531) Pulse Rate:  [66-89] 88 (12/19 0531) Cardiac Rhythm:  [-] Normal sinus rhythm (12/18 2000) Resp:  [16-20] 18 (12/19 0531) BP: (172-206)/(55-81) 179/67 mmHg (12/19 0531) SpO2:  [96 %-100 %] 97 % (12/19 0531) Weight:  [253 lb 8.5 oz (115 kg)-264 lb (119.75 kg)] 253 lb 8.5 oz (115 kg) (12/18 1637)   Recent Labs Lab 04/09/14 1720 04/09/14 2138 04/10/14 0645  GLUCAP 141* 255* 169*    Recent Labs Lab 04/09/14 1212 04/09/14 1727 04/10/14 0447  NA 134*  --  136*  K 4.3  --  4.2  CL 100  --  102  CO2 18*  --  18*  GLUCOSE 199*  --  154*  BUN 51*  --  50*  CREATININE 2.85* 2.65* 2.59*  CALCIUM 8.7  --  8.3*    Recent Labs Lab 04/09/14 1212  AST 11  ALT 11  ALKPHOS 96  BILITOT 0.4  PROT 7.2  ALBUMIN 3.1*    Recent Labs Lab 04/09/14 1212 04/09/14 1727 04/10/14 0447  WBC 7.9 7.4 8.3  NEUTROABS 6.0  --    --   HGB 9.8* 10.0* 9.6*  HCT 30.7* 30.9* 29.2*  MCV 77.9* 76.1* 77.7*  PLT 319 291 273    Recent Labs Lab 04/09/14 1727 04/09/14 2325 04/10/14 0447  TROPONINI <0.30 <0.30 <0.30   No results for input(s): LABPROT, INR in the last 72 hours. No results for input(s): COLORURINE, LABSPEC, [place], GLUCOSEU, HGBUR, BILIRUBINUR, KETONESUR, PROTEINUR, UROBILINOGEN, NITRITE, LEUKOCYTESUR in the last 72 hours.  Invalid input(s): APPERANCEUR     Component Value Date/Time   CHOL 191 04/10/2014 0447   TRIG 157* 04/10/2014 0447   HDL 38* 04/10/2014 0447   CHOLHDL 5.0 04/10/2014 0447   VLDL 31 04/10/2014 0447   LDLCALC 122* 04/10/2014 0447   Lab Results  Component Value Date   HGBA1C 8.9* 12/14/2012      Component Value Date/Time   LABOPIA NONE DETECTED 04/09/2014 2218   COCAINSCRNUR NONE DETECTED 04/09/2014 2218   LABBENZ NONE DETECTED 04/09/2014 2218   AMPHETMU NONE DETECTED 04/09/2014 2218   THCU NONE DETECTED 04/09/2014 2218   LABBARB NONE DETECTED 04/09/2014 2218    No results for input(s): ETH in the last 168 hours.  Dg Chest 2 View 04/10/2014    Cardiomegaly with mild vascular congestion. Slight improvement aeration compared with priors.  Ct Head Wo Contrast 04/09/2014    1. No acute intracranial abnormality.  2. Mild atrophy and minimal chronic microvascular white matter ischemic changes.      MRI / MRA  Brain Wo Contrast 04/10/2014     MRI HEAD IMPRESSION:   1. 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 No associated hemorrhage or significant mass effect.  2. No other acute intracranial process.    MRA HEAD IMPRESSION:   1. 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  2. Diminutive and irregular P1 segments bilaterally with associated moderate to severe  stenosis. These changes are worse on the left. Small posterior communicating arteries are present bilaterally.  3. Nonvisualization of the right vertebral artery, which is suspected to terminate in PICA.  4. Moderate short-segment stenosis of the distal right M1 segment at/just prior to the MCA bifurcation.        PHYSICAL EXAM Neurological Examination Mental Status: Alert, oriented, thought content appropriate. Speech fluent without evidence of aphasia. Able to follow 3 step commands without difficulty. Cranial Nerves: II: Visual fields grossly normal, pupils equal, round, reactive to light and accommodation III,IV, VI: ptosis not present, extra-ocular motions intact bilaterally V,VII: smile symmetric, facial light touch sensation normal bilaterally VIII: hearing normal bilaterally IX,X: gag reflex present XI: bilateral shoulder shrug XII: midline tongue extension Motor: Strength 5/5 throughout Sensory: Light touch intact throughout, bilaterally Cerebellar: Normal finger-to-nose testing bilaterally    ASSESSMENT/PLAN Mr. Roger Wood is a 73 y.o. male with history of diabetes mellitus, hypertension, gout, prostate cancer history status post radiation therapy, presenting with acute onset right-sided weakness. He did not receive IV t-PA due to resolution of deficits.   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 Probable embolic source.   Resultant resolution of deficits  MRI  as above  MRA  nonvisualization of the right vertebral artery.  Carotid Doppler  pending  2D Echo  pending  LDL 122  HgbA1c pending  Subcutaneous heparin for VTE prophylaxis  Diet heart healthy/carb modified with thin liquids  no antithrombotic prior to admission, now on aspirin 325 mg orally every day  Patient counseled to be compliant with his antithrombotic  medications  Ongoing aggressive stroke risk factor management  Therapy recommendations:  No physical therapy follow-up recommended.  Disposition:  Pending  Hypertension  Home meds:  Norvasc 10 mg daily and Avapro 300 mg daily.  Somewhat high Permissive hypertension <220/120 for 24-48 hours and then gradually normalize within 5-7 days   Hyperlipidemia  Home meds:  Pravachol 40 mg daily resumed in hospital  LDL from 22, goal < 70  Changed Pravachol to Lipitor  Continue statin at discharge  Diabetes  HgbA1c pending goal < 7.0  Uncontrolled  Other Stroke Risk Factors  Advanced age  ETOH use  Obesity, Body mass index is 35.38 kg/(m^2).   Family hx stroke (paternal uncle)   Other Active Problems  Chronic kidney disease  Anemia Medical noncompliance secondary to financial situation. Will request care manager consult   Other Pertinent History  Jehovah's Witness - opposed to blood transfusions  Hospital day # [place] PA-C [facility] Pager [phone] 04/10/2014, 9:52 AM     To contact Stroke Continuity provider, please refer to [email or web address]  After hours, contact General Neurology

## 2014-04-10 NOTE — Progress Notes (Signed)
VASCULAR LAB PRELIMINARY  PRELIMINARY  PRELIMINARY  PRELIMINARY  Carotid Dopplers completed.    Preliminary report:  1-39% ICA stenosis.  Vertebral artery flow is antegrade.  There are elevated velocities in the left CCA.    Anysha Frappier, RVT 04/10/2014, 3:12 PM

## 2014-04-10 NOTE — Progress Notes (Signed)
  Echocardiogram 2D Echocardiogram has been performed.  Roger Wood 04/10/2014, 4:48 PM

## 2014-04-11 ENCOUNTER — Inpatient Hospital Stay (HOSPITAL_COMMUNITY): Payer: Medicare Other

## 2014-04-11 DIAGNOSIS — N184 Chronic kidney disease, stage 4 (severe): Secondary | ICD-10-CM

## 2014-04-11 LAB — BASIC METABOLIC PANEL
ANION GAP: 14 (ref 5–15)
BUN: 51 mg/dL — ABNORMAL HIGH (ref 6–23)
CHLORIDE: 101 meq/L (ref 96–112)
CO2: 19 meq/L (ref 19–32)
CREATININE: 2.88 mg/dL — AB (ref 0.50–1.35)
Calcium: 8.3 mg/dL — ABNORMAL LOW (ref 8.4–10.5)
GFR calc Af Amer: 23 mL/min — ABNORMAL LOW (ref >90)
GFR calc non Af Amer: 20 mL/min — ABNORMAL LOW (ref >90)
Glucose, Bld: 178 mg/dL — ABNORMAL HIGH (ref 70–99)
POTASSIUM: 4.7 meq/L (ref 3.7–5.3)
Sodium: 134 mEq/L — ABNORMAL LOW (ref 137–147)

## 2014-04-11 LAB — GLUCOSE, CAPILLARY
GLUCOSE-CAPILLARY: 157 mg/dL — AB (ref 70–99)
GLUCOSE-CAPILLARY: 118 mg/dL — AB (ref 70–99)
Glucose-Capillary: 156 mg/dL — ABNORMAL HIGH (ref 70–99)
Glucose-Capillary: 121 mg/dL — ABNORMAL HIGH (ref 70–99)

## 2014-04-11 LAB — TROPONIN I: Troponin I: <0.3 ng/mL (ref <0.30)

## 2014-04-11 MED ORDER — NITROGLYCERIN 0.4 MG SL SUBL
0.4000 mg | SUBLINGUAL_TABLET | SUBLINGUAL | Status: DC | PRN
  Administered 2014-04-11 (×2): 0.4 mg via SUBLINGUAL

## 2014-04-11 MED ORDER — METOPROLOL TARTRATE 25 MG PO TABS
25.0000 mg | ORAL_TABLET | Freq: Two times a day (BID) | ORAL | Status: DC
  Administered 2014-04-11: 25 mg via ORAL
  Filled 2014-04-11: qty 1

## 2014-04-11 MED ORDER — METOPROLOL TARTRATE 50 MG PO TABS
50.0000 mg | ORAL_TABLET | Freq: Two times a day (BID) | ORAL | Status: DC
  Administered 2014-04-11 – 2014-04-12 (×2): 50 mg via ORAL
  Filled 2014-04-11 (×2): qty 1

## 2014-04-11 MED ORDER — NITROGLYCERIN 0.4 MG SL SUBL
SUBLINGUAL_TABLET | SUBLINGUAL | Status: AC
  Filled 2014-04-11: qty 1

## 2014-04-11 MED ORDER — MORPHINE SULFATE 2 MG/ML IJ SOLN
2.0000 mg | Freq: Once | INTRAMUSCULAR | Status: AC
  Administered 2014-04-11: 2 mg via INTRAVENOUS

## 2014-04-11 MED ORDER — MORPHINE SULFATE 2 MG/ML IJ SOLN
INTRAMUSCULAR | Status: AC
  Filled 2014-04-11: qty 1

## 2014-04-11 MED ORDER — LABETALOL HCL 5 MG/ML IV SOLN: 5.0000 mg | INTRAVENOUS | Status: DC | PRN

## 2014-04-11 MED ORDER — HYDRALAZINE HCL 20 MG/ML IJ SOLN: 10.0000 mg | Freq: Four times a day (QID) | INTRAMUSCULAR | Status: DC | PRN

## 2014-04-11 NOTE — Progress Notes (Signed)
STROKE TEAM PROGRESS NOTE   HISTORY Roger Wood is an 73 y.o. male with multiple medical problems who reports that early this morning while up watching television had the acute onset of right sided weakness. He reports being able to work himself to the bedroom where he went to sleep. When he awakened his symptoms had resolved but he decided to present for evaluation.  The patient also reports having an episode while driving about a week ago when he had blurry vision. Onset was acute. When he closed one eye the vision was normal. He drove the entire way home with just one eye open. He then went to sleep and when he awakened his vision had returned to normal.   Date last known well: Date: 04/09/2014 Time last known well: Time: 04:00 tPA Given: No: Resolution of symptoms   SUBJECTIVE (INTERVAL HISTORY) Multiple family members present. The patient is without complaints. We discussed the patient's hemoglobin A1c of 9.3 and the fact that the patient will need better glucose control. He is scheduled to meet with a dietitian.   OBJECTIVE Temp:  [97.5 F (36.4 C)-98.8 F (37.1 C)] 98.2 F (36.8 C) (12/20 0637) Pulse Rate:  [67-91] 87 (12/20 0637) Cardiac Rhythm:  [-] Normal sinus rhythm (12/20 0825) Resp:  [18-20] 20 (12/20 0637) BP: (169-195)/(57-70) 180/69 mmHg (12/20 0637) SpO2:  [94 %-99 %] 97 % (12/20 0637)   Recent Labs Lab 04/10/14 0645 04/10/14 1220 04/10/14 1708 04/10/14 2149 04/11/14 0640  GLUCAP 169* 160* 187* 152* 156*    Recent Labs Lab 04/09/14 1212 04/09/14 1727 04/10/14 0447 04/11/14 0331  NA 134*  --  136* 134*  K 4.3  --  4.2 4.7  CL 100  --  102 101  CO2 18*  --  18* 19  GLUCOSE 199*  --  154* 178*  BUN 51*  --  50* 51*  CREATININE 2.85* 2.65* 2.59* 2.88*  CALCIUM 8.7  --  8.3* 8.3*    Recent Labs Lab 04/09/14 1212  AST 11  ALT 11  ALKPHOS 96  BILITOT 0.4  PROT 7.2  ALBUMIN 3.1*    Recent Labs Lab 04/09/14 1212 04/09/14 1727  04/10/14 0447  WBC 7.9 7.4 8.3  NEUTROABS 6.0  --   --   HGB 9.8* 10.0* 9.6*  HCT 30.7* 30.9* 29.2*  MCV 77.9* 76.1* 77.7*  PLT 319 291 273    Recent Labs Lab 04/09/14 1727 04/09/14 2325 04/10/14 0447  TROPONINI <0.30 <0.30 <0.30   No results for input(s): LABPROT, INR in the last 72 hours. No results for input(s): COLORURINE, LABSPEC, [place], GLUCOSEU, HGBUR, BILIRUBINUR, KETONESUR, PROTEINUR, UROBILINOGEN, NITRITE, LEUKOCYTESUR in the last 72 hours.  Invalid input(s): APPERANCEUR     Component Value Date/Time   CHOL 191 04/10/2014 0447   TRIG 157* 04/10/2014 0447   HDL 38* 04/10/2014 0447   CHOLHDL 5.0 04/10/2014 0447   VLDL 31 04/10/2014 0447   LDLCALC 122* 04/10/2014 0447   Lab Results  Component Value Date   HGBA1C 9.3* 04/10/2014      Component Value Date/Time   LABOPIA NONE DETECTED 04/09/2014 2218   COCAINSCRNUR NONE DETECTED 04/09/2014 2218   LABBENZ NONE DETECTED 04/09/2014 2218   AMPHETMU NONE DETECTED 04/09/2014 2218   THCU NONE DETECTED 04/09/2014 2218   LABBARB NONE DETECTED 04/09/2014 2218    No results for input(s): ETH in the last 168 hours.  Dg Chest 2 View 04/10/2014    Cardiomegaly with mild vascular congestion. Slight improvement aeration  compared with priors.      Ct Head Wo Contrast 04/09/2014    1. No acute intracranial abnormality.  2. Mild atrophy and minimal chronic microvascular white matter ischemic changes.      MRI / MRA  Brain Wo Contrast 04/10/2014     MRI HEAD IMPRESSION:   1. 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 No associated hemorrhage or significant mass effect.  2. No other acute intracranial process.    MRA HEAD IMPRESSION:   1. 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  2. Diminutive and irregular P1  segments bilaterally with associated moderate to severe stenosis. These changes are worse on the left. Small posterior communicating arteries are present bilaterally.  3. Nonvisualization of the right vertebral artery, which is suspected to terminate in PICA.  4. Moderate short-segment stenosis of the distal right M1 segment at/just prior to the MCA bifurcation.        PHYSICAL EXAM Neurological Examination Mental Status: Alert, oriented, thought content appropriate. Speech fluent without evidence of aphasia. Able to follow 3 step commands without difficulty. Cranial Nerves: II: Visual fields grossly normal, pupils equal, round, reactive to light and accommodation III,IV, VI: ptosis not present, extra-ocular motions intact bilaterally V,VII: smile symmetric, facial light touch sensation normal bilaterally VIII: hearing normal bilaterally IX,X: gag reflex present XI: bilateral shoulder shrug XII: midline tongue extension Motor: Strength 5/5 throughout Sensory: Light touch intact throughout, bilaterally Cerebellar: Normal finger-to-nose testing bilaterally    ASSESSMENT/PLAN Mr. Roger Wood is a 73 y.o. male with history of diabetes mellitus, hypertension, gout, prostate cancer history status post radiation therapy, presenting with acute onset right-sided weakness. He did not receive IV t-PA due to resolution of deficits.   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 Probable embolic source.   Resultant resolution of deficits  MRI  as above  MRA  nonvisualization of the right vertebral artery.  Carotid Doppler - 1-39% ICA stenosis. Vertebral artery flow is antegrade. There are elevated velocities in the left CCA.   2D Echo - EF 60-65%. No cardiac source of emboli identified.  LDL 122  HgbA1c - 9.3  Subcutaneous heparin for VTE  prophylaxis  Diet heart healthy/carb modified with thin liquids  no antithrombotic prior to admission, now on aspirin 325 mg orally every day  Patient counseled to be compliant with his antithrombotic medications  Ongoing aggressive stroke risk factor management  Therapy recommendations:  No follow-up PT or OT recommended.  Disposition:  Pending  Hypertension  Home meds:  Norvasc 10 mg daily and Avapro 300 mg daily.  Somewhat high Permissive hypertension <220/120 for 24-48 hours and then gradually normalize within 5-7 days   Hyperlipidemia  Home meds:  Pravachol 40 mg daily resumed in hospital  LDL from 22, goal < 70  Changed Pravachol to Lipitor  Continue statin at discharge  Diabetes  HgbA1c pending goal < 7.0  Uncontrolled  Other Stroke Risk Factors  Advanced age  ETOH use  Obesity, Body mass index is 35.38 kg/(m^2).   Family hx stroke (paternal uncle)   Other Active Problems  Chronic kidney disease  Anemia   Medical noncompliance secondary to financial situation. Will request care manager consult. - Pending  Workup complete unless TEE and loop  are felt to be indicated.  Other Pertinent History  Jehovah's Witness - opposed to blood transfusions  Hospital day # 2  Mikey Bussing PA-C [facility] Pager [phone] 04/11/2014, 9:49 AM  Stroke work up [place] Will s/off  Please call with questions. Leotis Pain    To contact Stroke Continuity provider, please refer to [email or web address] After hours, contact General Neurology

## 2014-04-11 NOTE — Progress Notes (Signed)
Lab called for stat labs

## 2014-04-11 NOTE — Progress Notes (Signed)
Patient started having SOB when ambulating which the patient and family stated was more than his usual. BP 176/70, 73 pulse, 99% SaO2 on room air, 25 RR. MD paged. New orders placed.

## 2014-04-11 NOTE — Progress Notes (Signed)
[facility] PROGRESS NOTE  Roger Wood PFX:[id number] DOB: Sep 05, 1940 DOA: 04/09/2014 PCP: Gearlean Alf., PA-C  Assessment/Plan: 1. 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 2d echo pending 2. Carotid dopplers without stenosis 3. Neurology following, appreciate input 4. No PT f/u per therapy recs 5. Discussed with Neurology earlier today with concerns for possible embolic source as CVA is seen B.  2. Twave inversions of V5-6 1. Asymptomatic 2. Will ekg 3. 2d echo without wall motion abnormality 3. Grade 2 diastolic CHF, likely chronic 1. On ARB 2. Clinically euvolemic 3. EF of around 65% 4. DM2 1. A1c of over 9 2. On SSI coverage 3. Cont for now 5. Prostate Cancer 1. Followed by Urology 6. CKD 1. Cr appears stable and at baseline 2. Monitor closely 3. GFR stable in the low-mid 20's 4. counseled pt to be more adherent with heart healthy and diabetic diet/regimen in the future. Pt understands that he is at very high risk for developing end stage renal/possible dialysis in the future if diabetes and HTN are not well controlled 5. Consult nutrition to assist with diet education 7. HLD 1. 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 DVT prophylaxis 1. Heparin subQ 10. HTN 1. LVH noted on 2d echo, suggestive of long hx of poorly controlled HTN 2. Pt admits to some noncompliance with meds and diet 3. Cessation done at bedside 4. For now, will add metoprolol to current regimen and titrate accordingly  Code Status: full Family Communication: Pt in room, family at bedside  Disposition Plan: pending   Consultants:    Procedures:    Antibiotics:    HPI/Subjective: Eager to go home. No complaints. Reports feeling well.  Objective: Filed Vitals:   04/11/14 0137 04/11/14 0637 04/11/14 1029 04/11/14 1158  BP: 195/69 180/69 192/67 191/76  Pulse: 91 87 85   Temp: 98.8 F (37.1  C) 98.2 F (36.8 C) 97.8 F (36.6 C)   TempSrc: Oral Oral Oral   Resp: 20 20 18    Height:      Weight:      SpO2: 94% 97% 97%     Intake/Output Summary (Last 24 hours) at 04/11/14 1347 Last data filed at 04/11/14 0700  Gross per 24 hour  Intake      0 ml  Output   1500 ml  Net  -1500 ml   Filed Weights   04/09/14 1637  Weight: 115 kg (253 lb 8.5 oz)    Exam:   General:  Awake, in nad  Cardiovascular: regular, s1, s2  Respiratory: normal resp effort, no wheezing  Abdomen: soft,nondistended  Musculoskeletal: perfused, no clubbing   Data Reviewed: Basic Metabolic Panel:  Recent Labs Lab 04/09/14 1212 04/09/14 1727 04/10/14 0447 04/11/14 0331  NA 134*  --  136* 134*  K 4.3  --  4.2 4.7  CL 100  --  102 101  CO2 18*  --  18* 19  GLUCOSE 199*  --  154* 178*  BUN 51*  --  50* 51*  CREATININE 2.85* 2.65* 2.59* 2.88*  CALCIUM 8.7  --  8.3* 8.3*   Liver Function Tests:  Recent Labs Lab 04/09/14 1212  AST 11  ALT 11  ALKPHOS 96  BILITOT 0.4  PROT 7.2  ALBUMIN 3.1*   No results for input(s): LIPASE, AMYLASE in the last 168 hours. No results for input(s): AMMONIA in the last 168 hours. CBC:  Recent Labs  Lab 04/09/14 1212 04/09/14 1727 04/10/14 0447  WBC 7.9 7.4 8.3  NEUTROABS 6.0  --   --   HGB 9.8* 10.0* 9.6*  HCT 30.7* 30.9* 29.2*  MCV 77.9* 76.1* 77.7*  PLT 319 291 273   Cardiac Enzymes:  Recent Labs Lab 04/09/14 1727 04/09/14 2325 04/10/14 0447  TROPONINI <0.30 <0.30 <0.30   BNP (last 3 results)  Recent Labs  04/09/14 1212  PROBNP 537.6*   CBG:  Recent Labs Lab 04/10/14 1220 04/10/14 1708 04/10/14 2149 04/11/14 0640 04/11/14 1136  GLUCAP 160* 187* 152* 156* 157*    No results found for this or any previous visit (from the past 240 hour(s)).   Studies: Dg Chest 2 View  04/10/2014   CLINICAL DATA:  Shortness of breath and weakness. History of hypertension. Initial encounter.  EXAM: CHEST  2 VIEW  COMPARISON:   12/17/2012 chest radiograph.  FINDINGS: Cardiomegaly is redemonstrated. Mild vascular congestion without overt failure or focal infiltrates. No significant effusion or pneumothorax.  IMPRESSION: Cardiomegaly with mild vascular congestion. Slight improvement aeration compared with priors.   Electronically Signed   By: Rolla Flatten M.D.   On: 04/10/2014 01:03   Ct Head Wo Contrast  04/09/2014   CLINICAL DATA:  Episode of blurred vision 1 week ago. Recurrent episode today with right arm and right leg weakness.  EXAM: CT HEAD WITHOUT CONTRAST  TECHNIQUE: Contiguous axial images were obtained from the base of the skull through the vertex without intravenous contrast.  COMPARISON:  None.  FINDINGS: No evidence of an acute infarct, acute hemorrhage, mass lesion, mass effect or hydrocephalus. 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 Mild atrophy. Mild periventricular low attenuation. No air-fluid levels in the paranasal sinuses or mastoid air cells.  IMPRESSION: 1. No acute intracranial abnormality. 2. Mild atrophy and minimal chronic microvascular white matter ischemic changes.   Electronically Signed   By: Lorin Picket M.D.   On: 04/09/2014 15:16   Mr Brain Wo Contrast  04/10/2014   CLINICAL DATA:  Initial evaluation for weakness, visual changes.  EXAM: MRI HEAD WITHOUT CONTRAST  MRA HEAD WITHOUT CONTRAST  TECHNIQUE: Multiplanar, multiecho pulse sequences of the brain and surrounding structures were obtained without intravenous contrast. Angiographic images of the head were obtained using MRA technique without contrast.  COMPARISON:  Prior CT from earlier the same day.  FINDINGS: MRI HEAD FINDINGS  Mild diffuse prominence of the CSF containing spaces is compatible with generalized age-related cerebral atrophy. No significant small vessel ischemic type changes identified within the cerebral white matter. No mass lesion, mass effect, or midline shift. Ventricles are normal  in size without evidence of hydrocephalus. There is no extra-axial fluid collection.  Multiple small foci of abnormal restricted diffusion seen involving the bilateral cerebellar hemispheres, left greater than right. Findings are consistent with acute ischemic infarcts. For reference purposes, the largest foci within the left cerebellar hemisphere measures 8 mm. The there is a single focus within the right cerebellar hemisphere measuring 9 mm. There are additional small ischemic infarcts more cephalad within the left occipital lobe (series 4, image 17). Additional 11 mm infarct present within the left parietal lobe (series 4, image 24). Tiny cortical infarcts seen more superiorly (series 4, image 28). There is question of a tiny subacute infarct within the deep white matter of the left frontal lobe (series 4, image 31). No associated hemorrhage or mass effect. No infarcts involving the basal ganglia or brainstem identified.  Craniocervical junction  within normal limits. Pituitary gland unremarkable.  No acute abnormality seen about the orbits.  Paranasal sinuses and mastoid air cells are clear.  Visualized bone marrow signal intensity is normal. Scalp soft tissues unremarkable.  MRA HEAD FINDINGS  ANTERIOR CIRCULATION:  The visualized portions of the distal cervical segments of the internal carotid arteries are widely patent with antegrade flow. The petrous segments are widely patent. Mild multi focal atherosclerotic irregularity present within the cavernous ICAs bilaterally without significant stenosis. Supra clinoid segments widely patent.  A1 segments, anterior communicating artery, and anterior cerebral arteries are widely patent.  M1 segments patent without proximal branch occlusion. There is a focal moderate stenosis within the distal right M1 segment at/just prior to the bifurcation (Series 6, image 62). Left M1 segment widely patent. Distal MCA branches well opacified and symmetric in appearance. No item to  or M3 branch occlusion.  POSTERIOR CIRCULATION:  The left vertebral artery is dominant. The right vertebral artery is not well visualized, and appears to terminate in the right pica. The posterior inferior cerebral arteries themselves are patent. Mild multi focal atherosclerotic irregularity present within the distal left vertebral artery without significant stenosis. There is multi focal atherosclerotic irregularity with mild to moderate stenosis in the proximal basilar artery. The basilar artery itself is diminutive. There is prominent atheromatous irregularity within the superior cerebellar arteries, left slightly worse than right. There is an apparent high-grade stenosis of the left P1 segment. Small left posterior communicating artery present. Distally, the left P2 segment demonstrates mild multi focal atherosclerotic irregularity without significant stenosis. The right P2 segment is diminutive with atherosclerotic irregularity. A right posterior communicating artery is present. Right P2 segment opacified to its distal branches.  No aneurysm or vascular malformation.  IMPRESSION: MRI HEAD IMPRESSION:  1. 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 No associated hemorrhage or significant mass effect. 2. No other acute intracranial process.  MRA HEAD IMPRESSION:  1. 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 2. Diminutive and irregular P1 segments bilaterally with associated moderate to severe stenosis. These changes are worse on the left. Small posterior communicating arteries are present bilaterally. 3. Nonvisualization of the right vertebral artery, which is suspected to terminate in PICA. 4. Moderate short-segment stenosis of the distal right M1 segment at/just prior to the MCA bifurcation.   Electronically  Signed   By: Jeannine Boga M.D.   On: 04/10/2014 01:20   Mr Jodene Nam Head/brain Wo Cm  04/10/2014   CLINICAL DATA:  Initial evaluation for weakness, visual changes.  EXAM: MRI HEAD WITHOUT CONTRAST  MRA HEAD WITHOUT CONTRAST  TECHNIQUE: Multiplanar, multiecho pulse sequences of the brain and surrounding structures were obtained without intravenous contrast. Angiographic images of the head were obtained using MRA technique without contrast.  COMPARISON:  Prior CT from earlier the same day.  FINDINGS: MRI HEAD FINDINGS  Mild diffuse prominence of the CSF containing spaces is compatible with generalized age-related cerebral atrophy. No significant small vessel ischemic type changes identified within the cerebral white matter. No mass lesion, mass effect, or midline shift. Ventricles are normal in size without evidence of hydrocephalus. There is no extra-axial fluid collection.  Multiple small foci of abnormal restricted diffusion seen involving the bilateral cerebellar hemispheres, left greater than right. Findings are consistent with acute ischemic infarcts. For reference purposes, the largest foci within the left cerebellar hemisphere  measures 8 mm. The there is a single focus within the right cerebellar hemisphere measuring 9 mm. There are additional small ischemic infarcts more cephalad within the left occipital lobe (series 4, image 17). Additional 11 mm infarct present within the left parietal lobe (series 4, image 24). Tiny cortical infarcts seen more superiorly (series 4, image 28). There is question of a tiny subacute infarct within the deep white matter of the left frontal lobe (series 4, image 31). No associated hemorrhage or mass effect. No infarcts involving the basal ganglia or brainstem identified.  Craniocervical junction within normal limits. Pituitary gland unremarkable.  No acute abnormality seen about the orbits.  Paranasal sinuses and mastoid air cells are clear.  Visualized bone marrow  signal intensity is normal. Scalp soft tissues unremarkable.  MRA HEAD FINDINGS  ANTERIOR CIRCULATION:  The visualized portions of the distal cervical segments of the internal carotid arteries are widely patent with antegrade flow. The petrous segments are widely patent. Mild multi focal atherosclerotic irregularity present within the cavernous ICAs bilaterally without significant stenosis. Supra clinoid segments widely patent.  A1 segments, anterior communicating artery, and anterior cerebral arteries are widely patent.  M1 segments patent without proximal branch occlusion. There is a focal moderate stenosis within the distal right M1 segment at/just prior to the bifurcation (Series 6, image 62). Left M1 segment widely patent. Distal MCA branches well opacified and symmetric in appearance. No item to or M3 branch occlusion.  POSTERIOR CIRCULATION:  The left vertebral artery is dominant. The right vertebral artery is not well visualized, and appears to terminate in the right pica. The posterior inferior cerebral arteries themselves are patent. Mild multi focal atherosclerotic irregularity present within the distal left vertebral artery without significant stenosis. There is multi focal atherosclerotic irregularity with mild to moderate stenosis in the proximal basilar artery. The basilar artery itself is diminutive. There is prominent atheromatous irregularity within the superior cerebellar arteries, left slightly worse than right. There is an apparent high-grade stenosis of the left P1 segment. Small left posterior communicating artery present. Distally, the left P2 segment demonstrates mild multi focal atherosclerotic irregularity without significant stenosis. The right P2 segment is diminutive with atherosclerotic irregularity. A right posterior communicating artery is present. Right P2 segment opacified to its distal branches.  No aneurysm or vascular malformation.  IMPRESSION: MRI HEAD IMPRESSION:  1. Multi  focal small ischemic infarcts involving the bilateral cerebellar hemispheres, left greater than right, as well as the left parietal and occipital lobes. No associated hemorrhage or significant mass effect. 2. No other acute intracranial process.  MRA HEAD IMPRESSION:  1. 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 2. Diminutive and irregular P1 segments bilaterally with associated moderate to severe stenosis. These changes are worse on the left. Small posterior communicating arteries are present bilaterally. 3. Nonvisualization of the right vertebral artery, which is suspected to terminate in PICA. 4. Moderate short-segment stenosis of the distal right M1 segment at/just prior to the MCA bifurcation.   Electronically Signed   By: Jeannine Boga M.D.   On: 04/10/2014 01:20    Scheduled Meds: . amLODipine  10 mg Oral q morning - 10a  . aspirin  300 mg Rectal Daily   Or  . aspirin  325 mg Oral Daily  . atorvastatin  40 mg Oral q1800  . heparin  5,000 Units Subcutaneous 3 times per day  . insulin aspart  0-15 Units Subcutaneous TID WC  .  irbesartan  300 mg Oral Daily  . metoprolol tartrate  25 mg Oral BID  . pantoprazole  40 mg Oral Q0600   Continuous Infusions:    Principal Problem:   TIA (transient ischemic attack) Active Problems:   Refusal of blood transfusions as patient is Jehovah's Witness   IgG monoclonal gammopathy   Hypertension   Type II diabetes mellitus with nephropathy   CKD (chronic kidney disease)   History of prostate cancer   Acute right-sided weakness   Right sided weakness   Abnormal EKG    Time spent: 56min    Hazyl Marseille, [facility] Pager [phone]. If 7PM-7AM, please contact night-coverage at [email or web address], password [facility] 04/11/2014, 1:47 PM  LOS: 2 days

## 2014-04-11 NOTE — Plan of Care (Signed)
Problem: Food- and Nutrition-Related Knowledge Deficit (NB-1.1) Goal: Nutrition education Formal process to instruct or train a patient/client in a skill or to impart knowledge to help patients/clients voluntarily manage or modify food choices and eating behavior to maintain or improve health. Outcome: Completed/Met Date Met:  04/11/14  RD consulted for nutrition education regarding diabetes. Spoke with patient and pt's wife on the phone.    Lab Results  Component Value Date    HGBA1C 9.3* 04/10/2014    RD to mail "Carbohydrate Counting for People with Diabetes" handout from the Academy of [employer] to patient's residence. Discussed different food groups and their effects on blood sugar, emphasizing carbohydrate-containing foods. RD to provide list of carbohydrates and recommended serving sizes of common foods.  Discussed importance of controlled and consistent carbohydrate intake throughout the day. Provided examples of ways to balance meals/snacks and encouraged intake of high-fiber, whole grain complex carbohydrates. Teach back method used.  Expect good compliance.  Body mass index is 35.38 kg/(m^2). Pt meets criteria for obesity based on current BMI.  Current diet order is heart healthy/CHO modified, patient is consuming approximately 100% of meals at this time. Labs and medications reviewed. No further nutrition interventions warranted at this time. RD contact information provided. If additional nutrition issues arise, please re-consult RD.  Recommended outpatient diabetes management to patient.   Clayton Bibles, MS, RD, LDN Pager: [phone] After Hours Pager: [phone]

## 2014-04-11 NOTE — Care Management Note (Signed)
    Page 1 of 1   04/11/2014     2:08:05 PM CARE MANAGEMENT NOTE 04/11/2014  Patient:  IGOR, BISHOP   Account Number:  [id number]  Date Initiated:  04/11/2014  Documentation initiated by:  [facility]  Subjective/Objective Assessment:   adm: Acute multifocal small ischemic infarcts involving B cerebellar hemispheres as well as L parietal and occipital lobes     Action/Plan:   discharge planning   Anticipated DC Date:  04/11/2014   Anticipated DC Plan:  [place]  CM consult      Choice offered to / List presented to:             Status of service:  Completed, signed off Medicare Important Message given?   (If response is "NO", the following Medicare IM given date fields will be blank) Date Medicare IM given:   Medicare IM given by:   Date Additional Medicare IM given:   Additional Medicare IM given by:    Discharge Disposition:  HOME/SELF CARE  Per UR Regulation:    If discussed at Long Length of Stay Meetings, dates discussed:    Comments:  04/11/14 13:50 CM received call from RN concerning pt's med asst.  Pt has [facility] insurance and [facility] is not available to pt.  No other CM needs were commiunicated.  Mariane Masters, BSN, CM [phone].

## 2014-04-11 NOTE — Progress Notes (Signed)
Patient states he has no pain, BP 155/63, pulse 68, RR 18, 96 SaO2 on 2L. MD paged.

## 2014-04-11 NOTE — Progress Notes (Signed)
Patient's blood pressure 191/76. Medication given; see MAR. BP unchanged after medication administration. MD notified, new orders placed.

## 2014-04-11 NOTE — Progress Notes (Signed)
Patient complains of chest pain. 176/68 BP, 78 pulse, 98% SaO2 2L [place], RR 19. MD paged. New orders placed.

## 2014-04-11 NOTE — Progress Notes (Signed)
Dietician notified that patient is waiting for consult in order to be discharged.

## 2014-04-12 DIAGNOSIS — R071 Chest pain on breathing: Secondary | ICD-10-CM

## 2014-04-12 LAB — BASIC METABOLIC PANEL
ANION GAP: 15 (ref 5–15)
BUN: 53 mg/dL — AB (ref 6–23)
CHLORIDE: 102 meq/L (ref 96–112)
CO2: 19 mEq/L (ref 19–32)
Calcium: 8.6 mg/dL (ref 8.4–10.5)
Creatinine, Ser: 2.82 mg/dL — ABNORMAL HIGH (ref 0.50–1.35)
GFR calc Af Amer: 24 mL/min — ABNORMAL LOW (ref >90)
GFR calc non Af Amer: 20 mL/min — ABNORMAL LOW (ref >90)
Glucose, Bld: 140 mg/dL — ABNORMAL HIGH (ref 70–99)
Potassium: 4.8 mEq/L (ref 3.7–5.3)
Sodium: 136 mEq/L — ABNORMAL LOW (ref 137–147)

## 2014-04-12 LAB — GLUCOSE, CAPILLARY
GLUCOSE-CAPILLARY: 123 mg/dL — AB (ref 70–99)
Glucose-Capillary: 131 mg/dL — ABNORMAL HIGH (ref 70–99)

## 2014-04-12 LAB — TROPONIN I

## 2014-04-12 MED ORDER — ALBUTEROL SULFATE (2.5 MG/3ML) 0.083% IN NEBU
2.5000 mg | INHALATION_SOLUTION | Freq: Once | RESPIRATORY_TRACT | Status: AC
  Administered 2014-04-12: 2.5 mg via RESPIRATORY_TRACT
  Filled 2014-04-12: qty 3

## 2014-04-12 MED ORDER — LIVING WELL WITH DIABETES BOOK
Freq: Once | Status: AC
  Administered 2014-04-12: 13:00:00
  Filled 2014-04-12: qty 1

## 2014-04-12 MED ORDER — METOPROLOL TARTRATE 50 MG PO TABS: 50.0000 mg | ORAL_TABLET | Freq: Two times a day (BID) | ORAL | Status: DC

## 2014-04-12 MED ORDER — IRBESARTAN 300 MG PO TABS: 300.0000 mg | ORAL_TABLET | Freq: Every day | ORAL | Status: DC

## 2014-04-12 NOTE — Progress Notes (Signed)
Patient c/o shortness of breath and soreness on the right side when he breathes in, not wheezing. Pt is currently on 2L O2 sats 98%. Notified Kathline Magic, order for nebulizer. RT called.

## 2014-04-12 NOTE — Consult Note (Signed)
CARDIOLOGY CONSULT NOTE      Patient ID: Roger Wood MRN: [id number] DOB/AGE: 06-19-1940 73 y.o.  Admit date: 04/09/2014 Referring PhysicianStephen Dierdre Highman, MD Primary [name], PA-C Primary Cardiologist  Dr. [name] [facility]) Reason for Consultation chest discomfort  HPI: 73 year old man who was admitted for stroke. He underwent echocardiogram showing no regional wall motion at her maladies. There was evidence of LVH. He admits to being noncompliant with medications as well as diet. He had some right-sided chest pain, worse with inspiration. He has had this symptom before when he drinks a lot of cold water or eats ice. He is doing both of these while he is in the hospital. The pain is not related to walking. It only occurs with a deep breath.  Prior to arrival, his most strenuous activity was walking from the house to the mailbox and back. This did not cause any type of chest discomfort. He does not do any regular exercise.  He was hospitalized in [place] for what sounds like congestive heart failure. He told me he was seen by cardiologist at that time. He was told that his heart was strong. I suspect he had diastolic heart failure at that time. He was sent home on diuretics.     Review of systems complete and found to be negative unless listed above   Past Medical History  Diagnosis Date  . Hypertension   . Gout   . Arthritis   . History of radiation therapy 01/27/11-03/26/11    prostate  . Refusal of blood transfusions as patient is Jehovah's Witness   . Cataract     b/l catartact surgery  . Anemia     takes iron supplements  . Prostate cancer 07/14/10    dx  . Diabetes mellitus     adult onset   . Type II diabetes mellitus with nephropathy     Patient denies    Family History  Problem Relation Age of Onset  . Breast cancer Sister   . Breast cancer Paternal Aunt   . Colon cancer Neg Hx   . Diabetes Mother   . Diabetes Sister   . Diabetes  Paternal Aunt   . Heart disease Father   . Stroke Paternal Uncle   . Cancer Paternal Uncle     back    History   Social History  . Marital Status: Married    Spouse Name: N/A    Number of Children: 14  . Years of Education: N/A   Occupational History  . Semi-retired Administrator    Social History Main Topics  . Smoking status: Former Smoker    Types: Cigarettes    Quit date: 04/24/1967  . Smokeless tobacco: Never Used  . Alcohol Use: 0.0 oz/week    4-5 Cans of beer per week  . Drug Use: No     Comment: 2 packs weekly,quit 30 years ago  . Sexual Activity: Not on file   Other Topics Concern  . Not on file   Social History Narrative   The Patient is a Sales promotion account executive Witness    History reviewed. No pertinent past surgical history.   Prescriptions prior to admission  Medication Sig Dispense Refill Last Dose  . amLODipine (NORVASC) 10 MG tablet Take 10 mg by mouth every morning.   04/08/2014 at Unknown time  . Azilsartan Medoxomil (EDARBI) 80 MG TABS Take 80 mg by mouth daily.   04/08/2014 at Unknown time  . furosemide (LASIX) 40 MG tablet Take  40 mg by mouth daily as needed (For leg swelling.).   04/08/2014 at Unknown time  . pravastatin (PRAVACHOL) 40 MG tablet Take 40 mg by mouth.   04/08/2014 at Unknown time  . potassium chloride (K-DUR,KLOR-CON) 10 MEQ tablet Take 20 mEq by mouth daily as needed (For potassium loss.).    Not Taking  . thiamine 100 MG tablet Take 1 tablet (100 mg total) by mouth daily. (Patient not taking: Reported on 04/09/2014) 30 tablet 1 Not Taking    Physical Exam: Vitals:   Filed Vitals:   04/11/14 2124 04/12/14 0138 04/12/14 0456 04/12/14 0541  BP: 164/57 165/64 175/74   Pulse: 72 67 62   Temp: 98.1 F (36.7 C) 98.4 F (36.9 C) 98.3 F (36.8 C)   TempSrc: Oral Oral Oral   Resp: 18 18 20    Height:      Weight:      SpO2: 98% 94% 98% 98%   I&O's:   Intake/Output Summary (Last 24 hours) at 04/12/14 0912 Last data filed at 04/12/14 0000   Gross per 24 hour  Intake      0 ml  Output    800 ml  Net   -800 ml   Physical exam: In no apparent distress  [place]/AT EOMI No JVD, No carotid bruit RRR S1S2  No wheezing Soft. NT, nondistended No edema. No focal motor or sensory deficits Normal affect No rash  Labs:   Lab Results  Component Value Date   WBC 8.3 04/10/2014   HGB 9.6* 04/10/2014   HCT 29.2* 04/10/2014   MCV 77.7* 04/10/2014   PLT 273 04/10/2014    Recent Labs Lab 04/09/14 1212  04/12/14 0604  NA 134*  < > 136*  K 4.3  < > 4.8  CL 100  < > 102  CO2 18*  < > 19  BUN 51*  < > 53*  CREATININE 2.85*  < > 2.82*  CALCIUM 8.7  < > 8.6  PROT 7.2  --   --   BILITOT 0.4  --   --   ALKPHOS 96  --   --   ALT 11  --   --   AST 11  --   --   GLUCOSE 199*  < > 140*  < > = values in this interval not displayed. Lab Results  Component Value Date   CKTOTAL 43 12/15/2012   TROPONINI <0.30 04/11/2014    Lab Results  Component Value Date   CHOL 191 04/10/2014   Lab Results  Component Value Date   HDL 38* 04/10/2014   Lab Results  Component Value Date   LDLCALC 122* 04/10/2014   Lab Results  Component Value Date   TRIG 157* 04/10/2014   Lab Results  Component Value Date   CHOLHDL 5.0 04/10/2014   No results found for: LDLDIRECT    Radiology: Cardiomegaly with mild interstitial edema  EKG: Normal sinus rhythm, nonspecific antero-lateral ST segment changes.  ASSESSMENT AND PLAN:  Principal Problem:   TIA (transient ischemic attack) Active Problems:   Refusal of blood transfusions as patient is Jehovah's Witness   IgG monoclonal gammopathy   Hypertension   Type II diabetes mellitus with nephropathy   CKD (chronic kidney disease)   History of prostate cancer   Acute right-sided weakness   Right sided weakness   Abnormal EKG  Atypical chest pain: Echocardiogram showed normal left ventricular function. Pain does not sound like ischemia. It is more pleuritic. Given his renal  insufficiency, I  would be hesitant to pursue any type of invasive workup. Therefore, will not plan any stress testing at this time. Certainly, if he develops exertional symptoms, could reconsider this strategy.   He needs aggressive risk factor modification. I stressed the importance of blood pressure control to help preserve his kidney function and to avoid congestive heart failure symptoms. He needs to exercise regularly and to lose weight. He needs aggressive dietary changes to help keep his blood sugar regulated. His family was present during this conversation and they agree. We'll have to see how compliant the patient will be with these recommendations.   We'll defer to neurology and internal medicine as to whether they think a transesophageal echocardiogram is warranted due to his stroke.    The patient is agreeable with this plan. He will contact us if he would like further cardiac care here in [place]. He does live in [place] and may end up seeing his provider at [facility].  I explained to him that if he has any type of exertional chest discomfort, he should seek medical attention.  Signed:   Mina Marble, MD, [facility] 04/12/2014, 9:12 AM

## 2014-04-12 NOTE — Progress Notes (Signed)
CM CONSULT FOR MEDICATION ASSISTANCE Patient has private insurance with [facility] with prescription drug coverage; CM unable to assist pt with medication due to prescription drug coverage

## 2014-04-12 NOTE — Progress Notes (Signed)
Pt. Was discharged to home in [place] at 1435. Family provided transportation in private vehicle. Prior to discharge, Telemetry, IV line discontinued and all Discharge documents reviewed. Stressed the importance of adhering to medication regimen and heart healthy diet. Patient verbalized understanding. 2 prescriptions given and follow up appointments reviewed.

## 2014-04-12 NOTE — Discharge Summary (Signed)
Physician Discharge Summary  Roger Wood SVX:[id number] DOB: 12/09/1940 DOA: 04/09/2014  PCP: Gearlean Alf., PA-C  Admit date: 04/09/2014 Discharge date: 04/12/2014  Time spent: 30 minutes  Recommendations for Outpatient Follow-up:  1. Follow up with PCP in 1-2 weeks 2. Follow up with Neurology as scheduled  Discharge Diagnoses:  Principal Problem:   TIA (transient ischemic attack) Active Problems:   Refusal of blood transfusions as patient is Jehovah's Witness   IgG monoclonal gammopathy   Hypertension   Type II diabetes mellitus with nephropathy   CKD (chronic kidney disease)   History of prostate cancer   Acute right-sided weakness   Right sided weakness   Abnormal EKG  Discharge Condition: Stable  Diet recommendation: Diabetic heart healthy  Filed Weights   04/09/14 1637  Weight: 115 kg (253 lb 8.5 oz)   History of present illness:  See admit h and p from 12/18 for details. Briefly, pt presents with visual changes and right sided weakness. The patient was admitted for further work up.  Hospital Course:  1. 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 2d echo pending 2. Carotid dopplers without stenosis 3. Neurology following, appreciate input. Further discussed with stroke team. Recs for 30 day event monitor after discharge. Cardiology notified. 4. No PT f/u per therapy recs 2. Twave inversions of V5-6 1. Asymptomatic 2. Will ekg 3. 2d echo without wall motion abnormality 3. Grade 2 diastolic CHF, likely chronic 1. LVH on echo 2. On ARB 3. Clinically euvolemic 4. EF of around 65% 4. DM2 1. A1c of over 9 2. On SSI coverage 3. Cont for now 5. Prostate Cancer 1. Followed by Urology 6. CKD 1. Cr appears stable and at baseline 2. Monitor closely 3. GFR stable in the low-mid 20's 4. counseled pt to be more adherent with heart healthy and diabetic diet/regimen in the future. Pt  understands that he is at very high risk for developing end stage renal/possible dialysis in the future if diabetes and HTN are not well controlled 5. Consulted nutrition to assist with diet education 7. HLD 1. 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 DVT prophylaxis 1. Heparin subQ while inpatient 10. HTN 1. LVH noted on 2d echo, suggestive of long hx of poorly controlled HTN 2. Pt admits to some noncompliance with meds and diet 3. Cessation done at bedside 4. Added metoprolol to current regimen. Continue to adjust bp meds as an outpatient  Consultations:  Neurology  Discharge Exam: Filed Vitals:   04/12/14 0138 04/12/14 0456 04/12/14 0541 04/12/14 1005  BP: 165/64 175/74  168/65  Pulse: 67 62  71  Temp: 98.4 F (36.9 C) 98.3 F (36.8 C)  97.9 F (36.6 C)  TempSrc: Oral Oral  Oral  Resp: 18 20  18   Height:      Weight:      SpO2: 94% 98% 98% 97%    General: Awake, in nad Cardiovascular: regular, s1, s2 Respiratory: normal resp effort, no wheezing  Discharge Instructions     Medication List    STOP taking these medications        EDARBI 80 MG Tabs  Generic drug:  Azilsartan Medoxomil  Replaced by:  irbesartan 300 MG tablet      TAKE these medications        amLODipine 10 MG tablet  Commonly known as:  NORVASC  Take 10 mg by mouth every morning.  furosemide 40 MG tablet  Commonly known as:  LASIX  Take 40 mg by mouth daily as needed (For leg swelling.).     irbesartan 300 MG tablet  Commonly known as:  AVAPRO  Take 1 tablet (300 mg total) by mouth daily.     metoprolol 50 MG tablet  Commonly known as:  LOPRESSOR  Take 1 tablet (50 mg total) by mouth 2 (two) times daily.     potassium chloride 10 MEQ tablet  Commonly known as:  K-DUR,KLOR-CON  Take 20 mEq by mouth daily as needed (For potassium loss.).     PRAVACHOL 40 MG tablet  Generic drug:  pravastatin  Take 40 mg by mouth.     thiamine 100 MG tablet  Take 1 tablet (100 mg  total) by mouth daily.       No Known Allergies Follow-up Information    Follow up with MAIER,ANDREW C., PA-C. Schedule an appointment as soon as possible for a visit in 1 week.   Specialty:  Physician Assistant   Contact information:   [address] [place] [address] [phone]        The results of significant diagnostics from this hospitalization (including imaging, microbiology, ancillary and laboratory) are listed below for reference.    Significant Diagnostic Studies: Dg Chest 2 View  04/10/2014   CLINICAL DATA:  Shortness of breath and weakness. History of hypertension. Initial encounter.  EXAM: CHEST  2 VIEW  COMPARISON:  12/17/2012 chest radiograph.  FINDINGS: Cardiomegaly is redemonstrated. Mild vascular congestion without overt failure or focal infiltrates. No significant effusion or pneumothorax.  IMPRESSION: Cardiomegaly with mild vascular congestion. Slight improvement aeration compared with priors.   Electronically Signed   By: Rolla Flatten M.D.   On: 04/10/2014 01:03   Ct Head Wo Contrast  04/09/2014   CLINICAL DATA:  Episode of blurred vision 1 week ago. Recurrent episode today with right arm and right leg weakness.  EXAM: CT HEAD WITHOUT CONTRAST  TECHNIQUE: Contiguous axial images were obtained from the base of the skull through the vertex without intravenous contrast.  COMPARISON:  None.  FINDINGS: No evidence of an acute infarct, acute hemorrhage, mass lesion, mass effect or hydrocephalus. 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 Mild atrophy. Mild periventricular low attenuation. No air-fluid levels in the paranasal sinuses or mastoid air cells.  IMPRESSION: 1. No acute intracranial abnormality. 2. Mild atrophy and minimal chronic microvascular white matter ischemic changes.   Electronically Signed   By: Lorin Picket M.D.   On: 04/09/2014 15:16   Mr Brain Wo Contrast  04/10/2014   CLINICAL DATA:  Initial  evaluation for weakness, visual changes.  EXAM: MRI HEAD WITHOUT CONTRAST  MRA HEAD WITHOUT CONTRAST  TECHNIQUE: Multiplanar, multiecho pulse sequences of the brain and surrounding structures were obtained without intravenous contrast. Angiographic images of the head were obtained using MRA technique without contrast.  COMPARISON:  Prior CT from earlier the same day.  FINDINGS: MRI HEAD FINDINGS  Mild diffuse prominence of the CSF containing spaces is compatible with generalized age-related cerebral atrophy. No significant small vessel ischemic type changes identified within the cerebral white matter. No mass lesion, mass effect, or midline shift. Ventricles are normal in size without evidence of hydrocephalus. There is no extra-axial fluid collection.  Multiple small foci of abnormal restricted diffusion seen involving the bilateral cerebellar hemispheres, left greater than right. Findings are consistent with acute ischemic infarcts. For reference purposes, the largest foci within  the left cerebellar hemisphere measures 8 mm. The there is a single focus within the right cerebellar hemisphere measuring 9 mm. There are additional small ischemic infarcts more cephalad within the left occipital lobe (series 4, image 17). Additional 11 mm infarct present within the left parietal lobe (series 4, image 24). Tiny cortical infarcts seen more superiorly (series 4, image 28). There is question of a tiny subacute infarct within the deep white matter of the left frontal lobe (series 4, image 31). No associated hemorrhage or mass effect. No infarcts involving the basal ganglia or brainstem identified.  Craniocervical junction within normal limits. Pituitary gland unremarkable.  No acute abnormality seen about the orbits.  Paranasal sinuses and mastoid air cells are clear.  Visualized bone marrow signal intensity is normal. Scalp soft tissues unremarkable.  MRA HEAD FINDINGS  ANTERIOR CIRCULATION:  The visualized portions of the  distal cervical segments of the internal carotid arteries are widely patent with antegrade flow. The petrous segments are widely patent. Mild multi focal atherosclerotic irregularity present within the cavernous ICAs bilaterally without significant stenosis. Supra clinoid segments widely patent.  A1 segments, anterior communicating artery, and anterior cerebral arteries are widely patent.  M1 segments patent without proximal branch occlusion. There is a focal moderate stenosis within the distal right M1 segment at/just prior to the bifurcation (Series 6, image 62). Left M1 segment widely patent. Distal MCA branches well opacified and symmetric in appearance. No item to or M3 branch occlusion.  POSTERIOR CIRCULATION:  The left vertebral artery is dominant. The right vertebral artery is not well visualized, and appears to terminate in the right pica. The posterior inferior cerebral arteries themselves are patent. Mild multi focal atherosclerotic irregularity present within the distal left vertebral artery without significant stenosis. There is multi focal atherosclerotic irregularity with mild to moderate stenosis in the proximal basilar artery. The basilar artery itself is diminutive. There is prominent atheromatous irregularity within the superior cerebellar arteries, left slightly worse than right. There is an apparent high-grade stenosis of the left P1 segment. Small left posterior communicating artery present. Distally, the left P2 segment demonstrates mild multi focal atherosclerotic irregularity without significant stenosis. The right P2 segment is diminutive with atherosclerotic irregularity. A right posterior communicating artery is present. Right P2 segment opacified to its distal branches.  No aneurysm or vascular malformation.  IMPRESSION: MRI HEAD IMPRESSION:  1. Multi focal small ischemic infarcts involving the bilateral cerebellar hemispheres, left greater than right, as well as the left parietal and  occipital lobes. No associated hemorrhage or significant mass effect. 2. No other acute intracranial process.  MRA HEAD IMPRESSION:  1. 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 2. Diminutive and irregular P1 segments bilaterally with associated moderate to severe stenosis. These changes are worse on the left. Small posterior communicating arteries are present bilaterally. 3. Nonvisualization of the right vertebral artery, which is suspected to terminate in PICA. 4. Moderate short-segment stenosis of the distal right M1 segment at/just prior to the MCA bifurcation.   Electronically Signed   By: Jeannine Boga M.D.   On: 04/10/2014 01:20   Dg Chest Port 1 View  04/11/2014   CLINICAL DATA:  Shortness of breath with wheezing. Admitted for a mini strokes. History of hypertension. Initial encounter.  EXAM: PORTABLE CHEST - 1 VIEW  COMPARISON:  04/10/2014 and 12/17/2012 radiographs.  FINDINGS: 1741 hr. The heart remains mildly enlarged. The mediastinal contours are stable. There is increased vascular  congestion with possible mild interstitial edema. No confluent airspace opacity, pleural effusion or pneumothorax demonstrated. The bones appear stable. Telemetry leads overlie the chest.  IMPRESSION: Cardiomegaly with possible mild interstitial edema suspicious for mild congestive heart failure. No confluent airspace opacity.   Electronically Signed   By: Camie Patience M.D.   On: 04/11/2014 17:59   Mr Jodene Nam Head/brain Wo Cm  04/10/2014   CLINICAL DATA:  Initial evaluation for weakness, visual changes.  EXAM: MRI HEAD WITHOUT CONTRAST  MRA HEAD WITHOUT CONTRAST  TECHNIQUE: Multiplanar, multiecho pulse sequences of the brain and surrounding structures were obtained without intravenous contrast. Angiographic images of the head were obtained using MRA technique without contrast.  COMPARISON:  Prior CT from earlier the same day.   FINDINGS: MRI HEAD FINDINGS  Mild diffuse prominence of the CSF containing spaces is compatible with generalized age-related cerebral atrophy. No significant small vessel ischemic type changes identified within the cerebral white matter. No mass lesion, mass effect, or midline shift. Ventricles are normal in size without evidence of hydrocephalus. There is no extra-axial fluid collection.  Multiple small foci of abnormal restricted diffusion seen involving the bilateral cerebellar hemispheres, left greater than right. Findings are consistent with acute ischemic infarcts. For reference purposes, the largest foci within the left cerebellar hemisphere measures 8 mm. The there is a single focus within the right cerebellar hemisphere measuring 9 mm. There are additional small ischemic infarcts more cephalad within the left occipital lobe (series 4, image 17). Additional 11 mm infarct present within the left parietal lobe (series 4, image 24). Tiny cortical infarcts seen more superiorly (series 4, image 28). There is question of a tiny subacute infarct within the deep white matter of the left frontal lobe (series 4, image 31). No associated hemorrhage or mass effect. No infarcts involving the basal ganglia or brainstem identified.  Craniocervical junction within normal limits. Pituitary gland unremarkable.  No acute abnormality seen about the orbits.  Paranasal sinuses and mastoid air cells are clear.  Visualized bone marrow signal intensity is normal. Scalp soft tissues unremarkable.  MRA HEAD FINDINGS  ANTERIOR CIRCULATION:  The visualized portions of the distal cervical segments of the internal carotid arteries are widely patent with antegrade flow. The petrous segments are widely patent. Mild multi focal atherosclerotic irregularity present within the cavernous ICAs bilaterally without significant stenosis. Supra clinoid segments widely patent.  A1 segments, anterior communicating artery, and anterior cerebral  arteries are widely patent.  M1 segments patent without proximal branch occlusion. There is a focal moderate stenosis within the distal right M1 segment at/just prior to the bifurcation (Series 6, image 62). Left M1 segment widely patent. Distal MCA branches well opacified and symmetric in appearance. No item to or M3 branch occlusion.  POSTERIOR CIRCULATION:  The left vertebral artery is dominant. The right vertebral artery is not well visualized, and appears to terminate in the right pica. The posterior inferior cerebral arteries themselves are patent. Mild multi focal atherosclerotic irregularity present within the distal left vertebral artery without significant stenosis. There is multi focal atherosclerotic irregularity with mild to moderate stenosis in the proximal basilar artery. The basilar artery itself is diminutive. There is prominent atheromatous irregularity within the superior cerebellar arteries, left slightly worse than right. There is an apparent high-grade stenosis of the left P1 segment. Small left posterior communicating artery present. Distally, the left P2 segment demonstrates mild multi focal atherosclerotic irregularity without significant stenosis. The right P2 segment is diminutive with atherosclerotic irregularity. A right posterior  communicating artery is present. Right P2 segment opacified to its distal branches.  No aneurysm or vascular malformation.  IMPRESSION: MRI HEAD IMPRESSION:  1. 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 No associated hemorrhage or significant mass effect. 2. No other acute intracranial process.  MRA HEAD IMPRESSION:  1. 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 2. Diminutive and irregular P1 segments bilaterally with associated moderate to severe stenosis. These  changes are worse on the left. Small posterior communicating arteries are present bilaterally. 3. Nonvisualization of the right vertebral artery, which is suspected to terminate in PICA. 4. Moderate short-segment stenosis of the distal right M1 segment at/just prior to the MCA bifurcation.   Electronically Signed   By: Jeannine Boga M.D.   On: 04/10/2014 01:20    Microbiology: No results found for this or any previous visit (from the past 240 hour(s)).   Labs: Basic Metabolic Panel:  Recent Labs Lab 04/09/14 1212 04/09/14 1727 04/10/14 0447 04/11/14 0331 04/12/14 0604  NA 134*  --  136* 134* 136*  K 4.3  --  4.2 4.7 4.8  CL 100  --  102 101 102  CO2 18*  --  18* 19 19  GLUCOSE 199*  --  154* 178* 140*  BUN 51*  --  50* 51* 53*  CREATININE 2.85* 2.65* 2.59* 2.88* 2.82*  CALCIUM 8.7  --  8.3* 8.3* 8.6   Liver Function Tests:  Recent Labs Lab 04/09/14 1212  AST 11  ALT 11  ALKPHOS 96  BILITOT 0.4  PROT 7.2  ALBUMIN 3.1*   No results for input(s): LIPASE, AMYLASE in the last 168 hours. No results for input(s): AMMONIA in the last 168 hours. CBC:  Recent Labs Lab 04/09/14 1212 04/09/14 1727 04/10/14 0447  WBC 7.9 7.4 8.3  NEUTROABS 6.0  --   --   HGB 9.8* 10.0* 9.6*  HCT 30.7* 30.9* 29.2*  MCV 77.9* 76.1* 77.7*  PLT 319 291 273   Cardiac Enzymes:  Recent Labs Lab 04/09/14 1727 04/09/14 2325 04/10/14 0447 04/11/14 1930 04/12/14 0850  TROPONINI <0.30 <0.30 <0.30 <0.30 <0.30   BNP: BNP (last 3 results)  Recent Labs  04/09/14 1212  PROBNP 537.6*   CBG:  Recent Labs Lab 04/11/14 1136 04/11/14 1630 04/11/14 2150 04/12/14 0653 04/12/14 0744  GLUCAP 157* 121* 118* 123* 131*   Signed:  CHIU, STEPHEN K  [facility] 04/12/2014, 12:32 PM

## 2014-04-12 NOTE — Progress Notes (Addendum)
Inpatient Diabetes Program Recommendations  AACE/ADA: New Consensus Statement on Inpatient Glycemic Control (2013)  Target Ranges:  Prepandial:   less than 140 mg/dL      Peak postprandial:   less than 180 mg/dL (1-2 hours)      Critically ill patients:  140 - 180 mg/dL     Results for LADARRYL, WRAGE (MRN [id number]) as of 04/12/2014 11:43  Ref. Range 04/11/2014 06:40 04/11/2014 11:36 04/11/2014 16:30 04/11/2014 21:50  Glucose-Capillary Latest Range: 70-99 mg/dL 156 (H) 157 (H) 121 (H) 118 (H)    Results for DAXON, KYNE (MRN [id number]) as of 04/12/2014 11:43  Ref. Range 04/10/2014 04:47  Hgb A1c MFr Bld Latest Range: <5.7 % 9.3 (H)    Admitted with CVA.  History of HTN, DM (pt denies having prior diagnosis of DM).   Home DM Meds: None   Current Insulin Orders: Novolog Moderate SSI    **Spoke with pt about new diagnosis.  Discussed A1C results with them and explained what an A1C is, basic pathophysiology of DM Type 2, basic home care, basic diabetes diet nutrition principles, importance of checking CBGs and maintaining good CBG control to prevent long-term and short-term complications.  Also reviewed blood sugar goals at home.    **RNs to provide ongoing basic DM education at bedside with this patient.  Have ordered educational booklet and DM videos.  RD spoke with patient over the weekend regarding DM diet education.  **Patient told me he has been eating a lot lately and thinks this may have had an affect on his CBGs.  Explained to patient that he has many risk factors (age, weight, ethnicity, family history) and that DM can sometimes be unavoidable.  Explained to patient that there are many ways to help keep blood sugar levels under control (nutrition, exercise, medications) and that he should follow up with his PCP (Dr. Sherrlyn Hock) soon after d/c to discuss this new diagnosis and any new medications the MD gives him before d/c.  Patient agreeable to this suggestion.    MD-  Please consider discharging patient on oral DM medications.  Metformin not a viable option as patient's creatinine is elevated.  Could try low dose Amaryl 2 mg daily and have patient follow up with his PCP.  Amaryl is now generic and can be purchased at [facility] for $4.  MD- Please also give pt a Rx for a CBG meter after d/c.  Use Order [id number].     Will follow Wyn Quaker RN, MSN, CDE Diabetes Coordinator Inpatient Diabetes Program Team Pager: [phone] (8a-10p)

## 2014-04-13 ENCOUNTER — Other Ambulatory Visit: Payer: Self-pay | Admitting: Neurology

## 2014-04-13 DIAGNOSIS — G459 Transient cerebral ischemic attack, unspecified: Secondary | ICD-10-CM

## 2014-04-13 LAB — GLUCOSE, CAPILLARY: Glucose-Capillary: 128 mg/dL — ABNORMAL HIGH (ref 70–99)

## 2014-04-14 SURGERY — ECHOCARDIOGRAM, TRANSESOPHAGEAL
Anesthesia: Moderate Sedation

## 2014-04-20 ENCOUNTER — Telehealth: Payer: Self-pay | Admitting: Internal Medicine

## 2014-04-20 ENCOUNTER — Other Ambulatory Visit: Payer: Self-pay | Admitting: *Deleted

## 2014-04-20 NOTE — Telephone Encounter (Signed)
Confirm appt for 04/21/14. Pt will pick new cal up for remaining appt.

## 2014-04-21 ENCOUNTER — Ambulatory Visit (HOSPITAL_BASED_OUTPATIENT_CLINIC_OR_DEPARTMENT_OTHER): Payer: Medicare Other

## 2014-04-21 ENCOUNTER — Telehealth: Payer: Self-pay | Admitting: *Deleted

## 2014-04-21 ENCOUNTER — Other Ambulatory Visit (HOSPITAL_BASED_OUTPATIENT_CLINIC_OR_DEPARTMENT_OTHER): Payer: Medicare Other

## 2014-04-21 ENCOUNTER — Ambulatory Visit: Payer: Medicare Other

## 2014-04-21 DIAGNOSIS — E1121 Type 2 diabetes mellitus with diabetic nephropathy: Secondary | ICD-10-CM

## 2014-04-21 DIAGNOSIS — I159 Secondary hypertension, unspecified: Secondary | ICD-10-CM

## 2014-04-21 DIAGNOSIS — N189 Chronic kidney disease, unspecified: Secondary | ICD-10-CM

## 2014-04-21 DIAGNOSIS — D649 Anemia, unspecified: Secondary | ICD-10-CM

## 2014-04-21 DIAGNOSIS — R630 Anorexia: Secondary | ICD-10-CM

## 2014-04-21 DIAGNOSIS — D631 Anemia in chronic kidney disease: Secondary | ICD-10-CM

## 2014-04-21 DIAGNOSIS — D472 Monoclonal gammopathy: Secondary | ICD-10-CM

## 2014-04-21 DIAGNOSIS — Z531 Procedure and treatment not carried out because of patient's decision for reasons of belief and group pressure: Secondary | ICD-10-CM

## 2014-04-21 DIAGNOSIS — Z8546 Personal history of malignant neoplasm of prostate: Secondary | ICD-10-CM

## 2014-04-21 LAB — CBC WITH DIFFERENTIAL/PLATELET
BASO%: 0.1 % (ref 0.0–2.0)
BASOS ABS: 0 10*3/uL (ref 0.0–0.1)
EOS ABS: 0.2 10*3/uL (ref 0.0–0.5)
EOS%: 1.8 % (ref 0.0–7.0)
HCT: 28.8 % — ABNORMAL LOW (ref 38.4–49.9)
HEMOGLOBIN: 9.3 g/dL — AB (ref 13.0–17.1)
LYMPH%: 8.8 % — ABNORMAL LOW (ref 14.0–49.0)
MCH: 24.5 pg — AB (ref 27.2–33.4)
MCHC: 32.3 g/dL (ref 32.0–36.0)
MCV: 76 fL — AB (ref 79.3–98.0)
MONO#: 0.7 10*3/uL (ref 0.1–0.9)
MONO%: 7.9 % (ref 0.0–14.0)
NEUT%: 81.4 % — AB (ref 39.0–75.0)
NEUTROS ABS: 7.5 10*3/uL — AB (ref 1.5–6.5)
Platelets: 372 10*3/uL (ref 140–400)
RBC: 3.79 10*6/uL — ABNORMAL LOW (ref 4.20–5.82)
RDW: 17 % — ABNORMAL HIGH (ref 11.0–14.6)
WBC: 9.1 10*3/uL (ref 4.0–10.3)
lymph#: 0.8 10*3/uL — ABNORMAL LOW (ref 0.9–3.3)

## 2014-04-21 MED ORDER — DARBEPOETIN ALFA 300 MCG/0.6ML IJ SOSY
300.0000 ug | PREFILLED_SYRINGE | Freq: Once | INTRAMUSCULAR | Status: AC
  Administered 2014-04-21: 300 ug via SUBCUTANEOUS
  Filled 2014-04-21: qty 0.6

## 2014-04-21 NOTE — Patient Instructions (Signed)
Darbepoetin Alfa injection What is this medicine? DARBEPOETIN ALFA (dar be POE e tin AL fa) helps your body make more red blood cells. It is used to treat anemia caused by chronic kidney failure and chemotherapy. This medicine may be used for other purposes; ask your health care provider or pharmacist if you have questions. COMMON BRAND NAME(S): Aranesp What should I tell my health care provider before I take this medicine? They need to know if you have any of these conditions: -blood clotting disorders or history of blood clots -cancer patient not on chemotherapy -cystic fibrosis -heart disease, such as angina, heart failure, or a history of a heart attack -hemoglobin level of 12 g/dL or greater -high blood pressure -low levels of folate, iron, or vitamin B12 -seizures -an unusual or allergic reaction to darbepoetin, erythropoietin, albumin, hamster proteins, latex, other medicines, foods, dyes, or preservatives -pregnant or trying to get pregnant -breast-feeding How should I use this medicine? This medicine is for injection into a vein or under the skin. It is usually given by a health care professional in a hospital or clinic setting. If you get this medicine at home, you will be taught how to prepare and give this medicine. Do not shake the solution before you withdraw a dose. Use exactly as directed. Take your medicine at regular intervals. Do not take your medicine more often than directed. It is important that you put your used needles and syringes in a special sharps container. Do not put them in a trash can. If you do not have a sharps container, call your pharmacist or healthcare provider to get one. Talk to your pediatrician regarding the use of this medicine in children. While this medicine may be used in children as young as 1 year for selected conditions, precautions do apply. Overdosage: If you think you have taken too much of this medicine contact a poison control center or  emergency room at once. NOTE: This medicine is only for you. Do not share this medicine with others. What if I miss a dose? If you miss a dose, take it as soon as you can. If it is almost time for your next dose, take only that dose. Do not take double or extra doses. What may interact with this medicine? Do not take this medicine with any of the following medications: -epoetin alfa This list may not describe all possible interactions. Give your health care provider a list of all the medicines, herbs, non-prescription drugs, or dietary supplements you use. Also tell them if you smoke, drink alcohol, or use illegal drugs. Some items may interact with your medicine. What should I watch for while using this medicine? Visit your prescriber or health care professional for regular checks on your progress and for the needed blood tests and blood pressure measurements. It is especially important for the doctor to make sure your hemoglobin level is in the desired range, to limit the risk of potential side effects and to give you the best benefit. Keep all appointments for any recommended tests. Check your blood pressure as directed. Ask your doctor what your blood pressure should be and when you should contact him or her. As your body makes more red blood cells, you may need to take iron, folic acid, or vitamin B supplements. Ask your doctor or health care provider which products are right for you. If you have kidney disease continue dietary restrictions, even though this medication can make you feel better. Talk with your doctor or health   care professional about the foods you eat and the vitamins that you take. What side effects may I notice from receiving this medicine? Side effects that you should report to your doctor or health care professional as soon as possible: -allergic reactions like skin rash, itching or hives, swelling of the face, lips, or tongue -breathing problems -changes in vision -chest  pain -confusion, trouble speaking or understanding -feeling faint or lightheaded, falls -high blood pressure -muscle aches or pains -pain, swelling, warmth in the leg -rapid weight gain -severe headaches -sudden numbness or weakness of the face, arm or leg -trouble walking, dizziness, loss of balance or coordination -seizures (convulsions) -swelling of the ankles, feet, hands -unusually weak or tired Side effects that usually do not require medical attention (report to your doctor or health care professional if they continue or are bothersome): -diarrhea -fever, chills (flu-like symptoms) -headaches -nausea, vomiting -redness, stinging, or swelling at site where injected This list may not describe all possible side effects. Call your doctor for medical advice about side effects. You may report side effects to FDA at 1-800-FDA-1088. Where should I keep my medicine? Keep out of the reach of children. Store in a refrigerator between 2 and 8 degrees C (36 and 46 degrees F). Do not freeze. Do not shake. Throw away any unused portion if using a single-dose vial. Throw away any unused medicine after the expiration date. NOTE: This sheet is a summary. It may not cover all possible information. If you have questions about this medicine, talk to your doctor, pharmacist, or health care provider.  2015, Elsevier/[employer] Standard. (2008-03-23 10:23:57)  

## 2014-04-21 NOTE — Telephone Encounter (Signed)
Received call for Dr Vista Mink stating that pt no showed for appt with Dr Moshe Cipro at [facility], will inform Dr Vista Mink

## 2014-05-12 ENCOUNTER — Ambulatory Visit (HOSPITAL_BASED_OUTPATIENT_CLINIC_OR_DEPARTMENT_OTHER): Payer: Medicare Other

## 2014-05-12 ENCOUNTER — Other Ambulatory Visit (HOSPITAL_BASED_OUTPATIENT_CLINIC_OR_DEPARTMENT_OTHER): Payer: Medicare Other

## 2014-05-12 DIAGNOSIS — D631 Anemia in chronic kidney disease: Secondary | ICD-10-CM

## 2014-05-12 DIAGNOSIS — I159 Secondary hypertension, unspecified: Secondary | ICD-10-CM

## 2014-05-12 DIAGNOSIS — D649 Anemia, unspecified: Secondary | ICD-10-CM

## 2014-05-12 DIAGNOSIS — N189 Chronic kidney disease, unspecified: Secondary | ICD-10-CM

## 2014-05-12 DIAGNOSIS — Z531 Procedure and treatment not carried out because of patient's decision for reasons of belief and group pressure: Secondary | ICD-10-CM

## 2014-05-12 DIAGNOSIS — E1121 Type 2 diabetes mellitus with diabetic nephropathy: Secondary | ICD-10-CM

## 2014-05-12 DIAGNOSIS — R630 Anorexia: Secondary | ICD-10-CM

## 2014-05-12 DIAGNOSIS — Z8546 Personal history of malignant neoplasm of prostate: Secondary | ICD-10-CM

## 2014-05-12 DIAGNOSIS — D472 Monoclonal gammopathy: Secondary | ICD-10-CM

## 2014-05-12 LAB — CBC WITH DIFFERENTIAL/PLATELET
BASO%: 1 % (ref 0.0–2.0)
BASOS ABS: 0.1 10*3/uL (ref 0.0–0.1)
EOS ABS: 0.2 10*3/uL (ref 0.0–0.5)
EOS%: 2.8 % (ref 0.0–7.0)
HCT: 33.1 % — ABNORMAL LOW (ref 38.4–49.9)
HEMOGLOBIN: 10.3 g/dL — AB (ref 13.0–17.1)
LYMPH%: 13.3 % — ABNORMAL LOW (ref 14.0–49.0)
MCH: 24.3 pg — ABNORMAL LOW (ref 27.2–33.4)
MCHC: 31.1 g/dL — ABNORMAL LOW (ref 32.0–36.0)
MCV: 78.2 fL — AB (ref 79.3–98.0)
MONO#: 1.1 10*3/uL — ABNORMAL HIGH (ref 0.1–0.9)
MONO%: 13.5 % (ref 0.0–14.0)
NEUT#: 5.6 10*3/uL (ref 1.5–6.5)
NEUT%: 69.4 % (ref 39.0–75.0)
Platelets: 240 10*3/uL (ref 140–400)
RBC: 4.23 10*6/uL (ref 4.20–5.82)
RDW: 19.1 % — ABNORMAL HIGH (ref 11.0–14.6)
WBC: 8.1 10*3/uL (ref 4.0–10.3)
lymph#: 1.1 10*3/uL (ref 0.9–3.3)

## 2014-05-12 MED ORDER — DARBEPOETIN ALFA 300 MCG/0.6ML IJ SOSY
300.0000 ug | PREFILLED_SYRINGE | Freq: Once | INTRAMUSCULAR | Status: AC
  Administered 2014-05-12: 300 ug via SUBCUTANEOUS
  Filled 2014-05-12: qty 0.6

## 2014-06-02 ENCOUNTER — Other Ambulatory Visit (HOSPITAL_BASED_OUTPATIENT_CLINIC_OR_DEPARTMENT_OTHER): Payer: Medicare Other

## 2014-06-02 ENCOUNTER — Ambulatory Visit: Payer: Medicare Other

## 2014-06-02 DIAGNOSIS — I159 Secondary hypertension, unspecified: Secondary | ICD-10-CM

## 2014-06-02 DIAGNOSIS — D472 Monoclonal gammopathy: Secondary | ICD-10-CM

## 2014-06-02 DIAGNOSIS — E1121 Type 2 diabetes mellitus with diabetic nephropathy: Secondary | ICD-10-CM

## 2014-06-02 DIAGNOSIS — Z531 Procedure and treatment not carried out because of patient's decision for reasons of belief and group pressure: Secondary | ICD-10-CM

## 2014-06-02 DIAGNOSIS — D649 Anemia, unspecified: Secondary | ICD-10-CM

## 2014-06-02 DIAGNOSIS — N189 Chronic kidney disease, unspecified: Secondary | ICD-10-CM

## 2014-06-02 DIAGNOSIS — R630 Anorexia: Secondary | ICD-10-CM

## 2014-06-02 DIAGNOSIS — D631 Anemia in chronic kidney disease: Secondary | ICD-10-CM

## 2014-06-02 DIAGNOSIS — Z8546 Personal history of malignant neoplasm of prostate: Secondary | ICD-10-CM

## 2014-06-02 LAB — CBC WITH DIFFERENTIAL/PLATELET
BASO%: 0.3 % (ref 0.0–2.0)
Basophils Absolute: 0 10*3/uL (ref 0.0–0.1)
EOS ABS: 0.1 10*3/uL (ref 0.0–0.5)
EOS%: 0.5 % (ref 0.0–7.0)
HEMATOCRIT: 32.4 % — AB (ref 38.4–49.9)
HEMOGLOBIN: 10.1 g/dL — AB (ref 13.0–17.1)
LYMPH%: 8.7 % — AB (ref 14.0–49.0)
MCH: 24.5 pg — ABNORMAL LOW (ref 27.2–33.4)
MCHC: 31.4 g/dL — ABNORMAL LOW (ref 32.0–36.0)
MCV: 78.1 fL — ABNORMAL LOW (ref 79.3–98.0)
MONO#: 0.7 10*3/uL (ref 0.1–0.9)
MONO%: 6.5 % (ref 0.0–14.0)
NEUT%: 84 % — AB (ref 39.0–75.0)
NEUTROS ABS: 9.1 10*3/uL — AB (ref 1.5–6.5)
Platelets: 276 10*3/uL (ref 140–400)
RBC: 4.14 10*6/uL — ABNORMAL LOW (ref 4.20–5.82)
RDW: 19.1 % — ABNORMAL HIGH (ref 11.0–14.6)
WBC: 10.8 10*3/uL — AB (ref 4.0–10.3)
lymph#: 0.9 10*3/uL (ref 0.9–3.3)

## 2014-06-02 MED ORDER — DARBEPOETIN ALFA 300 MCG/0.6ML IJ SOSY
300.0000 ug | PREFILLED_SYRINGE | Freq: Once | INTRAMUSCULAR | Status: DC
  Filled 2014-06-02: qty 0.6

## 2014-06-02 NOTE — Progress Notes (Signed)
Sukhraj here for Aranesp injection.  HGB 10.1 today.   BP 215/70-75, rechecked and continues to be elevated.  Will hold Aranesp today.

## 2014-06-23 ENCOUNTER — Other Ambulatory Visit (HOSPITAL_BASED_OUTPATIENT_CLINIC_OR_DEPARTMENT_OTHER): Payer: Medicare Other

## 2014-06-23 ENCOUNTER — Ambulatory Visit: Payer: Medicare Other

## 2014-06-23 DIAGNOSIS — D631 Anemia in chronic kidney disease: Secondary | ICD-10-CM

## 2014-06-23 DIAGNOSIS — D638 Anemia in other chronic diseases classified elsewhere: Secondary | ICD-10-CM

## 2014-06-23 DIAGNOSIS — N189 Chronic kidney disease, unspecified: Secondary | ICD-10-CM

## 2014-06-23 DIAGNOSIS — D649 Anemia, unspecified: Secondary | ICD-10-CM

## 2014-06-23 LAB — CBC WITH DIFFERENTIAL/PLATELET
BASO%: 0.3 % (ref 0.0–2.0)
BASOS ABS: 0 10*3/uL (ref 0.0–0.1)
EOS%: 1.1 % (ref 0.0–7.0)
Eosinophils Absolute: 0.2 10*3/uL (ref 0.0–0.5)
HEMATOCRIT: 33.4 % — AB (ref 38.4–49.9)
HEMOGLOBIN: 11 g/dL — AB (ref 13.0–17.1)
LYMPH#: 2.3 10*3/uL (ref 0.9–3.3)
LYMPH%: 16.2 % (ref 14.0–49.0)
MCH: 25.4 pg — ABNORMAL LOW (ref 27.2–33.4)
MCHC: 32.9 g/dL (ref 32.0–36.0)
MCV: 77.1 fL — ABNORMAL LOW (ref 79.3–98.0)
MONO#: 0.9 10*3/uL (ref 0.1–0.9)
MONO%: 6.3 % (ref 0.0–14.0)
NEUT%: 76.1 % — ABNORMAL HIGH (ref 39.0–75.0)
NEUTROS ABS: 10.6 10*3/uL — AB (ref 1.5–6.5)
PLATELETS: 240 10*3/uL (ref 140–400)
RBC: 4.33 10*6/uL (ref 4.20–5.82)
RDW: 17.8 % — ABNORMAL HIGH (ref 11.0–14.6)
WBC: 13.9 10*3/uL — ABNORMAL HIGH (ref 4.0–10.3)

## 2014-06-23 LAB — FERRITIN CHCC: FERRITIN: 670 ng/mL — AB (ref 22–316)

## 2014-06-23 LAB — IRON AND TIBC CHCC
%SAT: 28 % (ref 20–55)
Iron: 66 ug/dL (ref 42–163)
TIBC: 236 ug/dL (ref 202–409)
UIBC: 170 ug/dL (ref 117–376)

## 2014-06-23 MED ORDER — DARBEPOETIN ALFA 300 MCG/0.6ML IJ SOSY: 300.0000 ug | PREFILLED_SYRINGE | Freq: Once | INTRAMUSCULAR | Status: DC

## 2014-06-28 ENCOUNTER — Telehealth: Payer: Self-pay | Admitting: Internal Medicine

## 2014-06-28 NOTE — Telephone Encounter (Signed)
returned call adn s.w. pt wife and confirmed 3.9 appt

## 2014-06-30 ENCOUNTER — Ambulatory Visit (HOSPITAL_BASED_OUTPATIENT_CLINIC_OR_DEPARTMENT_OTHER): Payer: Medicare Other | Admitting: Internal Medicine

## 2014-06-30 ENCOUNTER — Telehealth: Payer: Self-pay | Admitting: Internal Medicine

## 2014-06-30 ENCOUNTER — Encounter: Payer: Self-pay | Admitting: Internal Medicine

## 2014-06-30 VITALS — BP 183/61 | HR 67 | Temp 98.8°F | Resp 18 | Ht 71.0 in | Wt 256.3 lb

## 2014-06-30 DIAGNOSIS — D649 Anemia, unspecified: Secondary | ICD-10-CM

## 2014-06-30 DIAGNOSIS — D472 Monoclonal gammopathy: Secondary | ICD-10-CM

## 2014-06-30 DIAGNOSIS — I10 Essential (primary) hypertension: Secondary | ICD-10-CM

## 2014-06-30 DIAGNOSIS — C61 Malignant neoplasm of prostate: Secondary | ICD-10-CM

## 2014-06-30 DIAGNOSIS — N189 Chronic kidney disease, unspecified: Secondary | ICD-10-CM

## 2014-06-30 MED ORDER — CLONIDINE HCL 0.1 MG PO TABS
ORAL_TABLET | ORAL | Status: AC
  Filled 2014-06-30: qty 1

## 2014-06-30 MED ORDER — CLONIDINE HCL 0.1 MG PO TABS
0.2000 mg | ORAL_TABLET | Freq: Once | ORAL | Status: AC
  Administered 2014-06-30: 0.2 mg via ORAL

## 2014-06-30 NOTE — Progress Notes (Signed)
[facility] [phone]   [phone] OFFICE PROGRESS NOTE   DIAGNOSIS:  1) Anemia of chronic disease.  2) Monoclonal gammopathy of undetermined significance. 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 Janice Norrie.  4) uncontrolled hypertension.  PRIOR THERAPY: Feraheme Infusion last dose was given 12/05/2012   CURRENT THERAPY: Aranesp 300 mcg subcutaneously every 3 weeks.  INTERVAL HISTORY: Roger Wood 74 y.o. male returns to the clinic today for followup visit accompanied by his wife. The patient is feeling fine today with no specific complaints except for mild fatigue. He has uncontrolled hypertension and currently on several medications by his primary care physician including Norvasc, Lasix, Avapro, and Lopressor. He mentioned that his blood pressure at home and with his primary care physician has always been in the normal range but the patient gets anxious and jittery when he comes to the [facility] which contributes to his uncontrolled hypertension. He denied having any chest pain, shortness of breath, cough or hemoptysis. He is tolerating his treatment with Aranesp 300 mcg subcutaneously every 3 weeks fairly well. He denied having any significant weight loss or night sweats. He denied having any nausea or vomiting or rectal bleeding. He has repeat iron study and ferritin performed recently and he is here for evaluation and discussion of his lab results.  MEDICAL HISTORY: Past Medical History  Diagnosis Date  . Hypertension   . Gout   . Arthritis   . History of radiation therapy 01/27/11-03/26/11    prostate  . Refusal of blood transfusions as patient is Jehovah's Witness   . Cataract     b/l catartact surgery  . Anemia     takes iron supplements  . Prostate cancer 07/14/10    dx  . Diabetes mellitus     adult onset   . Type II diabetes mellitus  with nephropathy     Patient denies    ALLERGIES:  has No Known Allergies.  MEDICATIONS:  Current Outpatient Prescriptions  Medication Sig Dispense Refill  . amLODipine (NORVASC) 10 MG tablet Take 10 mg by mouth every morning.    . furosemide (LASIX) 40 MG tablet Take 40 mg by mouth daily as needed (For leg swelling.).    Marland Kitchen irbesartan (AVAPRO) 300 MG tablet Take 1 tablet (300 mg total) by mouth daily. 30 tablet 0  . metoprolol (LOPRESSOR) 50 MG tablet Take 1 tablet (50 mg total) by mouth 2 (two) times daily. 30 tablet 0  . potassium chloride (K-DUR,KLOR-CON) 10 MEQ tablet Take 20 mEq by mouth daily as needed (For potassium loss.).     Marland Kitchen pravastatin (PRAVACHOL) 40 MG tablet Take 40 mg by mouth.    . thiamine 100 MG tablet Take 1 tablet (100 mg total) by mouth daily. 30 tablet 1   No current facility-administered medications for this visit.    REVIEW OF SYSTEMS:  Constitutional: positive for fatigue Eyes: negative Ears, nose, mouth, throat, and face: negative Respiratory: positive for dyspnea on exertion Cardiovascular: negative Gastrointestinal: negative Genitourinary:negative Integument/breast: negative Hematologic/lymphatic: negative Musculoskeletal:negative Neurological: negative Behavioral/Psych: negative Endocrine: negative Allergic/Immunologic: negative   PHYSICAL EXAMINATION: General appearance: alert, cooperative, fatigued and no distress Head: Normocephalic, without obvious abnormality, atraumatic Neck: no adenopathy, no JVD, supple, symmetrical, trachea midline and thyroid not enlarged, symmetric, no tenderness/mass/nodules Lymph nodes: Cervical, supraclavicular, and axillary nodes normal. Resp: clear to auscultation bilaterally Back: symmetric, no curvature. ROM  normal. No CVA tenderness. Cardio: regular rate and rhythm, S1, S2 normal, no murmur, click, rub or gallop GI: soft, non-tender; bowel sounds normal; no masses,  no organomegaly Extremities: extremities  normal, atraumatic, no cyanosis or edema  ECOG PERFORMANCE STATUS: 2 - Symptomatic, <50% confined to bed  There were no vitals taken for this visit.  LABORATORY DATA: Lab Results  Component Value Date   WBC 13.9* 06/23/2014   HGB 11.0* 06/23/2014   HCT 33.4* 06/23/2014   MCV 77.1* 06/23/2014   PLT 240 06/23/2014      Chemistry      Component Value Date/Time   NA 136* 04/12/2014 0604   NA 134* 03/17/2014 1238   K 4.8 04/12/2014 0604   K 4.3 03/17/2014 1238   CL 102 04/12/2014 0604   CL 106 01/29/2012 1520   CO2 19 04/12/2014 0604   CO2 21* 03/17/2014 1238   BUN 53* 04/12/2014 0604   BUN 64.4* 03/17/2014 1238   CREATININE 2.82* 04/12/2014 0604   CREATININE 2.9* 03/17/2014 1238      Component Value Date/Time   CALCIUM 8.6 04/12/2014 0604   CALCIUM 9.2 03/17/2014 1238   ALKPHOS 96 04/09/2014 1212   ALKPHOS 112 03/17/2014 1238   AST 11 04/09/2014 1212   AST 9 03/17/2014 1238   ALT 11 04/09/2014 1212   ALT 11 03/17/2014 1238   BILITOT 0.4 04/09/2014 1212   BILITOT 0.31 03/17/2014 1238       RADIOGRAPHIC STUDIES:   ASSESSMENT AND PLAN: This is a very pleasant 74 years old [place] American male with persistent anemia most likely secondary to anemia of chronic disease secondary to chronic renal insufficiency.   His myeloma panel and iron study showed no significant disease progression or iron deficiency. The patient continues to have persistent anemia and he is Jehovah's Witness. I recommended for him to continue treatment with Aranesp 300 mcg subcutaneously every 3 weeks for the anemia of chronic disease. He will not receive his injection today because his hematocrit is above the critical level for the injection. Marland Kitchen His blood pressure was also elevated today and I gave the patient a dose of clonidine 0.2 mg by mouth 1. I would see him back for followup visit in 3 months with repeat CBC and iron study. He was advised to call immediately if he has any concerning symptoms  in the interval. The patient voices understanding of current disease status and treatment options and is in agreement with the current care plan.  All questions were answered. The patient knows to call the clinic with any problems, questions or concerns. We can certainly see the patient much sooner if necessary.  Disclaimer: This note was dictated with voice recognition software. Similar sounding words can inadvertently be transcribed and may not be corrected upon review.

## 2014-06-30 NOTE — Telephone Encounter (Signed)
gv and printed appt sched and avs for pt for March thru June °

## 2014-07-01 ENCOUNTER — Ambulatory Visit: Payer: Self-pay | Admitting: Neurology

## 2014-07-14 ENCOUNTER — Telehealth: Payer: Self-pay | Admitting: Internal Medicine

## 2014-07-14 ENCOUNTER — Other Ambulatory Visit: Payer: Medicare Other

## 2014-07-14 ENCOUNTER — Ambulatory Visit: Payer: Medicare Other

## 2014-07-14 NOTE — Telephone Encounter (Signed)
spoke with patient wife and cx appt....pt will call back to r/s

## 2014-07-22 ENCOUNTER — Inpatient Hospital Stay (HOSPITAL_COMMUNITY)
Admission: EM | Admit: 2014-07-22 | Discharge: 2014-08-06 | DRG: 326 | Disposition: A | Discharge status: Skilled Nursing Facility | Payer: Medicare Other | Attending: Family Medicine | Admitting: Family Medicine

## 2014-07-22 ENCOUNTER — Encounter (HOSPITAL_COMMUNITY): Payer: Self-pay | Admitting: Emergency Medicine

## 2014-07-22 DIAGNOSIS — E1165 Type 2 diabetes mellitus with hyperglycemia: Secondary | ICD-10-CM | POA: Diagnosis present

## 2014-07-22 DIAGNOSIS — R0602 Shortness of breath: Secondary | ICD-10-CM

## 2014-07-22 DIAGNOSIS — N184 Chronic kidney disease, stage 4 (severe): Secondary | ICD-10-CM | POA: Diagnosis present

## 2014-07-22 DIAGNOSIS — K625 Hemorrhage of anus and rectum: Secondary | ICD-10-CM | POA: Diagnosis not present

## 2014-07-22 DIAGNOSIS — K644 Residual hemorrhoidal skin tags: Secondary | ICD-10-CM | POA: Diagnosis present

## 2014-07-22 DIAGNOSIS — D631 Anemia in chronic kidney disease: Secondary | ICD-10-CM | POA: Diagnosis present

## 2014-07-22 DIAGNOSIS — E1121 Type 2 diabetes mellitus with diabetic nephropathy: Secondary | ICD-10-CM | POA: Diagnosis not present

## 2014-07-22 DIAGNOSIS — E669 Obesity, unspecified: Secondary | ICD-10-CM | POA: Diagnosis present

## 2014-07-22 DIAGNOSIS — Z87891 Personal history of nicotine dependence: Secondary | ICD-10-CM | POA: Diagnosis not present

## 2014-07-22 DIAGNOSIS — D62 Acute posthemorrhagic anemia: Secondary | ICD-10-CM | POA: Diagnosis present

## 2014-07-22 DIAGNOSIS — R509 Fever, unspecified: Secondary | ICD-10-CM

## 2014-07-22 DIAGNOSIS — E875 Hyperkalemia: Secondary | ICD-10-CM | POA: Diagnosis not present

## 2014-07-22 DIAGNOSIS — J9601 Acute respiratory failure with hypoxia: Secondary | ICD-10-CM | POA: Diagnosis not present

## 2014-07-22 DIAGNOSIS — I5032 Chronic diastolic (congestive) heart failure: Secondary | ICD-10-CM | POA: Diagnosis not present

## 2014-07-22 DIAGNOSIS — K3182 Dieulafoy lesion (hemorrhagic) of stomach and duodenum: Principal | ICD-10-CM | POA: Diagnosis present

## 2014-07-22 DIAGNOSIS — E785 Hyperlipidemia, unspecified: Secondary | ICD-10-CM | POA: Diagnosis present

## 2014-07-22 DIAGNOSIS — Q279 Congenital malformation of peripheral vascular system, unspecified: Secondary | ICD-10-CM | POA: Diagnosis not present

## 2014-07-22 DIAGNOSIS — Z923 Personal history of irradiation: Secondary | ICD-10-CM | POA: Diagnosis not present

## 2014-07-22 DIAGNOSIS — K922 Gastrointestinal hemorrhage, unspecified: Secondary | ICD-10-CM | POA: Diagnosis present

## 2014-07-22 DIAGNOSIS — K648 Other hemorrhoids: Secondary | ICD-10-CM | POA: Diagnosis present

## 2014-07-22 DIAGNOSIS — K5641 Fecal impaction: Secondary | ICD-10-CM | POA: Diagnosis present

## 2014-07-22 DIAGNOSIS — N183 Chronic kidney disease, stage 3 unspecified: Secondary | ICD-10-CM | POA: Diagnosis present

## 2014-07-22 DIAGNOSIS — Z794 Long term (current) use of insulin: Secondary | ICD-10-CM

## 2014-07-22 DIAGNOSIS — Z531 Procedure and treatment not carried out because of patient's decision for reasons of belief and group pressure: Secondary | ICD-10-CM | POA: Diagnosis present

## 2014-07-22 DIAGNOSIS — K250 Acute gastric ulcer with hemorrhage: Secondary | ICD-10-CM

## 2014-07-22 DIAGNOSIS — I129 Hypertensive chronic kidney disease with stage 1 through stage 4 chronic kidney disease, or unspecified chronic kidney disease: Secondary | ICD-10-CM | POA: Diagnosis present

## 2014-07-22 DIAGNOSIS — R110 Nausea: Secondary | ICD-10-CM | POA: Diagnosis not present

## 2014-07-22 DIAGNOSIS — K920 Hematemesis: Secondary | ICD-10-CM | POA: Diagnosis present

## 2014-07-22 DIAGNOSIS — Z7982 Long term (current) use of aspirin: Secondary | ICD-10-CM

## 2014-07-22 DIAGNOSIS — N179 Acute kidney failure, unspecified: Secondary | ICD-10-CM | POA: Diagnosis not present

## 2014-07-22 DIAGNOSIS — D472 Monoclonal gammopathy: Secondary | ICD-10-CM | POA: Diagnosis present

## 2014-07-22 DIAGNOSIS — IMO0001 Reserved for inherently not codable concepts without codable children: Secondary | ICD-10-CM

## 2014-07-22 DIAGNOSIS — Z6835 Body mass index (BMI) 35.0-35.9, adult: Secondary | ICD-10-CM | POA: Diagnosis not present

## 2014-07-22 HISTORY — DX: Acute gastric ulcer with hemorrhage: K25.0

## 2014-07-22 HISTORY — DX: Family history of other specified conditions: Z84.89

## 2014-07-22 LAB — COMPREHENSIVE METABOLIC PANEL
ALT: 14 U/L (ref 0–53)
ANION GAP: 12 (ref 5–15)
AST: 10 U/L (ref 0–37)
Albumin: 2.6 g/dL — ABNORMAL LOW (ref 3.5–5.2)
Alkaline Phosphatase: 53 U/L (ref 39–117)
BUN: 109 mg/dL — AB (ref 6–23)
CO2: 18 mmol/L — ABNORMAL LOW (ref 19–32)
Calcium: 8.3 mg/dL — ABNORMAL LOW (ref 8.4–10.5)
Chloride: 104 mmol/L (ref 96–112)
Creatinine, Ser: 2.97 mg/dL — ABNORMAL HIGH (ref 0.50–1.35)
GFR calc Af Amer: 22 mL/min — ABNORMAL LOW (ref >90)
GFR calc non Af Amer: 19 mL/min — ABNORMAL LOW (ref >90)
Glucose, Bld: 302 mg/dL — ABNORMAL HIGH (ref 70–99)
POTASSIUM: 4.9 mmol/L (ref 3.5–5.1)
Sodium: 134 mmol/L — ABNORMAL LOW (ref 135–145)
Total Bilirubin: 0.4 mg/dL (ref 0.3–1.2)
Total Protein: 5.1 g/dL — ABNORMAL LOW (ref 6.0–8.3)

## 2014-07-22 LAB — CBC
HCT: 14.8 % — ABNORMAL LOW (ref 39.0–52.0)
Hemoglobin: 4.8 g/dL — CL (ref 13.0–17.0)
MCH: 25.1 pg — ABNORMAL LOW (ref 26.0–34.0)
MCHC: 32.4 g/dL (ref 30.0–36.0)
MCV: 77.5 fL — AB (ref 78.0–100.0)
PLATELETS: 304 10*3/uL (ref 150–400)
RBC: 1.91 MIL/uL — AB (ref 4.22–5.81)
RDW: 19.1 % — ABNORMAL HIGH (ref 11.5–15.5)
WBC: 13.4 10*3/uL — ABNORMAL HIGH (ref 4.0–10.5)

## 2014-07-22 LAB — PROTIME-INR
INR: 1.11 (ref 0.00–1.49)
Prothrombin Time: 14.4 seconds (ref 11.6–15.2)

## 2014-07-22 LAB — POC OCCULT BLOOD, ED: Fecal Occult Bld: POSITIVE — AB

## 2014-07-22 LAB — NO BLOOD PRODUCTS

## 2014-07-22 MED ORDER — MORPHINE SULFATE 2 MG/ML IJ SOLN
1.0000 mg | INTRAMUSCULAR | Status: DC | PRN
  Administered 2014-07-23: 1 mg via INTRAVENOUS
  Filled 2014-07-22: qty 1

## 2014-07-22 MED ORDER — PANTOPRAZOLE SODIUM 40 MG IV SOLR
40.0000 mg | Freq: Two times a day (BID) | INTRAVENOUS | Status: DC
  Administered 2014-07-22 – 2014-07-23 (×2): 40 mg via INTRAVENOUS
  Filled 2014-07-22 (×3): qty 40

## 2014-07-22 MED ORDER — ACETAMINOPHEN 650 MG RE SUPP: 650.0000 mg | Freq: Four times a day (QID) | RECTAL | Status: DC | PRN

## 2014-07-22 MED ORDER — HYDRALAZINE HCL 50 MG PO TABS
50.0000 mg | ORAL_TABLET | Freq: Two times a day (BID) | ORAL | Status: DC
Start: 2014-07-22 — End: 2014-07-26
  Administered 2014-07-22 – 2014-07-26 (×8): 50 mg via ORAL
  Filled 2014-07-22 (×9): qty 1

## 2014-07-22 MED ORDER — SODIUM CHLORIDE 0.9 % IJ SOLN
3.0000 mL | Freq: Two times a day (BID) | INTRAMUSCULAR | Status: DC
  Administered 2014-07-22 – 2014-08-06 (×20): 3 mL via INTRAVENOUS

## 2014-07-22 MED ORDER — TRANEXAMIC ACID 100 MG/ML IV SOLN
1000.0000 mg | Freq: Once | INTRAVENOUS | Status: AC
  Administered 2014-07-22: 1000 mg via INTRAVENOUS
  Filled 2014-07-22: qty 10

## 2014-07-22 MED ORDER — TRANEXAMIC ACID 100 MG/ML IV SOLN
500.0000 mg | Freq: Once | INTRAVENOUS | Status: DC
  Filled 2014-07-22: qty 10

## 2014-07-22 MED ORDER — HYDROCODONE-ACETAMINOPHEN 5-325 MG PO TABS
1.0000 | ORAL_TABLET | ORAL | Status: DC | PRN
  Filled 2014-07-22: qty 1

## 2014-07-22 MED ORDER — METOPROLOL SUCCINATE ER 50 MG PO TB24
50.0000 mg | ORAL_TABLET | Freq: Every day | ORAL | Status: DC
  Administered 2014-07-22 – 2014-07-24 (×3): 50 mg via ORAL
  Filled 2014-07-22 (×3): qty 1

## 2014-07-22 MED ORDER — PANTOPRAZOLE SODIUM 40 MG IV SOLR
40.0000 mg | Freq: Once | INTRAVENOUS | Status: AC
  Administered 2014-07-22: 40 mg via INTRAVENOUS
  Filled 2014-07-22: qty 40

## 2014-07-22 MED ORDER — SODIUM CHLORIDE 0.9 % IV SOLN
INTRAVENOUS | Status: DC
  Administered 2014-07-22: 50 mL/h via INTRAVENOUS
  Administered 2014-07-23 – 2014-07-25 (×3): via INTRAVENOUS
  Administered 2014-07-25: 100 mL/h via INTRAVENOUS
  Administered 2014-07-26 – 2014-07-27 (×3): via INTRAVENOUS

## 2014-07-22 MED ORDER — SODIUM CHLORIDE 0.9 % IV SOLN
25.0000 mg | Freq: Once | INTRAVENOUS | Status: AC
  Administered 2014-07-22: 25 mg via INTRAVENOUS
  Filled 2014-07-22: qty 0.5

## 2014-07-22 MED ORDER — SODIUM CHLORIDE 0.9 % IV SOLN
50.0000 ug/h | INTRAVENOUS | Status: DC
  Filled 2014-07-22 (×2): qty 1

## 2014-07-22 MED ORDER — DARBEPOETIN ALFA 200 MCG/0.4ML IJ SOSY
100.0000 ug | PREFILLED_SYRINGE | Freq: Once | INTRAMUSCULAR | Status: DC
  Filled 2014-07-22: qty 0.4

## 2014-07-22 MED ORDER — IRON DEXTRAN 50 MG/ML IJ SOLN
1000.0000 mg | Freq: Once | INTRAMUSCULAR | Status: AC
  Administered 2014-07-22: 1000 mg via INTRAVENOUS
  Filled 2014-07-22: qty 20

## 2014-07-22 MED ORDER — OCTREOTIDE LOAD VIA INFUSION
50.0000 ug | Freq: Once | INTRAVENOUS | Status: DC
  Filled 2014-07-22: qty 25

## 2014-07-22 MED ORDER — DARBEPOETIN ALFA 100 MCG/0.5ML IJ SOSY
100.0000 ug | PREFILLED_SYRINGE | Freq: Once | INTRAMUSCULAR | Status: AC
  Administered 2014-07-22: 100 ug via SUBCUTANEOUS
  Filled 2014-07-22: qty 0.5

## 2014-07-22 MED ORDER — ACETAMINOPHEN 325 MG PO TABS
650.0000 mg | ORAL_TABLET | Freq: Four times a day (QID) | ORAL | Status: DC | PRN
  Administered 2014-08-03 – 2014-08-04 (×2): 650 mg via ORAL
  Filled 2014-07-22 (×2): qty 2

## 2014-07-22 MED ORDER — AMLODIPINE BESYLATE 10 MG PO TABS
10.0000 mg | ORAL_TABLET | Freq: Every morning | ORAL | Status: DC
Start: 2014-07-23 — End: 2014-07-26
  Administered 2014-07-23 – 2014-07-26 (×4): 10 mg via ORAL
  Filled 2014-07-22 (×4): qty 1

## 2014-07-22 NOTE — ED Notes (Signed)
Report attempted. Nurse to call back for report.

## 2014-07-22 NOTE — ED Notes (Signed)
MD at bedside. 

## 2014-07-22 NOTE — H&P (Signed)
[facility] History and Physical  VISHRUTH SEOANE XVQ:[id number] DOB: 13-Nov-1940 DOA: 07/22/2014  Referring physician: EDP PCP: Roger Alf., PA-C   Chief Complaint: Dark stool and coffee-ground emesis  HPI: Roger Wood is a 74 y.o. male with past medical history of CKD stage III, MGUS and HTN. Patient came in to the hospital complaining about hematemesis. Patient follows with Dr. Julien Nordmann for his MGUS, he has anemia of chronic disease secondary to CKD and MGUS, he gets erythropoietin and iron infusion every now and then, he started to have dark stools for the past 1 week soon after that he developed some dizziness and early fatigability, he vomited coffee-ground emesis twice today so he came into the ED for help. In the ED patient was found to have hemoglobin of 4.8, last hemoglobin was 11 on 06/23/2014. 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  Review of Systems:  Constitutional: negative for anorexia, fevers and sweats Eyes: negative for irritation, redness and visual disturbance Ears, nose, mouth, throat, and face: negative for earaches, epistaxis, nasal congestion and sore throat Respiratory: negative for cough, dyspnea on exertion, sputum and wheezing Cardiovascular: negative for chest pain, dyspnea, lower extremity edema, orthopnea, palpitations and syncope Gastrointestinal: Per HPI Genitourinary:negative for dysuria, frequency and hematuria Hematologic/lymphatic: negative for bleeding, easy bruising and lymphadenopathy Musculoskeletal:negative for arthralgias, muscle weakness and stiff joints Neurological: negative for coordination problems, gait problems, headaches and weakness Endocrine: negative for diabetic symptoms including polydipsia, polyuria and weight loss Allergic/Immunologic: negative for anaphylaxis, hay fever and urticaria  Past Medical History  Diagnosis Date  . Hypertension   . Gout   . Arthritis   .  History of radiation therapy 01/27/11-03/26/11    prostate  . Refusal of blood transfusions as patient is Jehovah's Witness   . Cataract     b/l catartact surgery  . Anemia     takes iron supplements  . Prostate cancer 07/14/10    dx  . Diabetes mellitus     adult onset   . Type II diabetes mellitus with nephropathy     Patient denies   History reviewed. No pertinent past surgical history. Social History:   reports that he quit smoking about 47 years ago. His smoking use included Cigarettes. He has never used smokeless tobacco. He reports that he does not drink alcohol or use illicit drugs.  No Known Allergies  Family History  Problem Relation Age of Onset  . Breast cancer Sister   . Breast cancer Paternal Aunt   . Colon cancer Neg Hx   . Diabetes Mother   . Diabetes Sister   . Diabetes Paternal Aunt   . Heart disease Father   . Stroke Paternal Uncle   . Cancer Paternal Uncle     back    Prior to Admission medications   Medication Sig Start Date End Date Taking? Authorizing Provider  allopurinol (ZYLOPRIM) 100 MG tablet Take 100 mg by mouth. 06/16/14  Yes Historical Provider, MD  amLODipine (NORVASC) 10 MG tablet Take 10 mg by mouth every morning.   Yes Historical Provider, MD  aspirin 325 MG tablet Take 325 mg by mouth daily.   Yes Historical Provider, MD  furosemide (LASIX) 40 MG tablet Take 40 mg by mouth daily as needed (For leg swelling.).   Yes Historical Provider, MD  hydrALAZINE (APRESOLINE) 50 MG tablet Take 50 mg by mouth 2 (two) times daily. 06/01/14  Yes Historical Provider, MD  metoprolol succinate (TOPROL-XL)  50 MG 24 hr tablet Take 50 mg by mouth daily. 07/02/14  Yes Historical Provider, MD  potassium chloride (K-DUR,KLOR-CON) 10 MEQ tablet Take 20 mEq by mouth daily as needed (For potassium loss.).    Yes Historical Provider, MD  pravastatin (PRAVACHOL) 40 MG tablet Take 40 mg by mouth. 04/13/13 04/13/17  Historical Provider, MD   Physical Exam: Filed Vitals:    07/22/14 1645  BP: 193/58  Pulse: 80  Temp:   Resp: 17   Constitutional: Oriented to person, place, and time. Well-developed and well-nourished. Cooperative.  Head: Normocephalic and atraumatic.  Nose: Nose normal.  Mouth/Throat: Uvula is midline, oropharynx is clear and moist and mucous membranes are normal.  Eyes: Conjunctivae and EOM are normal. Pupils are equal, round, and reactive to light.  Neck: Trachea normal and normal range of motion. Neck supple.  Cardiovascular: Normal rate, regular rhythm, S1 normal, S2 normal, normal heart sounds and intact distal pulses.   Pulmonary/Chest: Effort normal and breath sounds normal.  Abdominal: Soft. Bowel sounds are normal. There is no hepatosplenomegaly. There is no tenderness.  Musculoskeletal: Normal range of motion.  Neurological: Alert and oriented to person, place, and time. Has normal strength. No cranial nerve deficit or sensory deficit.  Skin: Skin is warm, dry and intact.  Psychiatric: Has a normal mood and affect. Speech is normal and behavior is normal.   Labs on Admission:  Basic Metabolic Panel:  Recent Labs Lab 07/22/14 1426  NA 134*  K 4.9  CL 104  CO2 18*  GLUCOSE 302*  BUN 109*  CREATININE 2.97*  CALCIUM 8.3*   Liver Function Tests:  Recent Labs Lab 07/22/14 1426  AST 10  ALT 14  ALKPHOS 53  BILITOT 0.4  PROT 5.1*  ALBUMIN 2.6*   No results for input(s): LIPASE, AMYLASE in the last 168 hours. No results for input(s): AMMONIA in the last 168 hours. CBC:  Recent Labs Lab 07/22/14 1426  WBC 13.4*  HGB 4.8*  HCT 14.8*  MCV 77.5*  PLT 304   Cardiac Enzymes: No results for input(s): CKTOTAL, CKMB, CKMBINDEX, TROPONINI in the last 168 hours.  BNP (last 3 results) No results for input(s): BNP in the last 8760 hours.  ProBNP (last 3 results)  Recent Labs  04/09/14 1212  PROBNP 537.6*    CBG: No results for input(s): GLUCAP in the last 168 hours.  Radiological Exams on Admission: No  results found.  EKG: Independently reviewed. NSR heart rate in the 90s  Assessment/Plan Principal Problem:   Acute blood loss anemia Active Problems:   Refusal of blood transfusions as patient is Jehovah's Witness   Type II diabetes mellitus with nephropathy   Upper GI bleed   CKD (chronic kidney disease), stage III    GI bleed Coffee-ground emesis and dark stools, patient is on aspirin, no other NSAIDs use. With markedly increase in the BUN, all suggesting upper GI bleeding. Gastroenterology consulted. Loaded with Protonix, Protonix 40 mg twice a day. Keep patient nothing by mouth, for possible EGD with or without colonoscopy.  Acute blood loss anemia Hemoglobin baseline 11 about 3 weeks ago, hemoglobin currently 4.8. Unfortunately patient is Jehovah's Witness, does not accept blood transfusion. We'll minimize blood draws by daily CBC. I gave 1025 mg of InFeD iron, Aranesp 200 g, can always refer to Dr. Julien Nordmann for further recommendation.  CKD stage III Creatinine around baseline, patient on trickle IV fluid at 50 mL per hour. Likely can discontinue IV fluids and a.m.  Hypertension  Appears to be difficult to control high blood pressure, even with him bleeding blood pressure in the high side. Restarted all blood pressure medications with holding parameters for the beta blockers.   Code Status: Full code Family Communication: Discussed with the patient in a room full of people (probably over 8 family members were in the room with him) Disposition Plan: Stepdown  Time spent: 77 minutes  Verlee Monte A, MD [facility] Pager [phone]

## 2014-07-22 NOTE — ED Provider Notes (Signed)
CSN: [id number]     Arrival date & time 07/22/14  1414 History   First MD Initiated Contact with Patient 07/22/14 1507     Chief Complaint  Patient presents with  . Hematemesis  . Weakness     (Consider location/radiation/quality/duration/timing/severity/associated sxs/prior Treatment) HPI   [name] with past medical history of hypertension, diabetes, Jehovah's Witness, who comes in with 1 week now dark stools is been having dark stools twice daily, lightheadedness upon standing without any syncope. No chest pain, no shortness of breath, no abdominal pain. The last days been having multiple episodes of dark emesis he's had 3. Which he says have been about a few spoonfuls each but his family members in the ribs is more like half a cup each.  Past Medical History  Diagnosis Date  . Hypertension   . Gout   . Arthritis   . History of radiation therapy 01/27/11-03/26/11    prostate  . Refusal of blood transfusions as patient is Jehovah's Witness   . Cataract     b/l catartact surgery  . Anemia     takes iron supplements  . Prostate cancer 07/14/10    dx  . Diabetes mellitus     adult onset   . Type II diabetes mellitus with nephropathy     Patient denies   History reviewed. No pertinent past surgical history. Family History  Problem Relation Age of Onset  . Breast cancer Sister   . Breast cancer Paternal Aunt   . Colon cancer Neg Hx   . Diabetes Mother   . Diabetes Sister   . Diabetes Paternal Aunt   . Heart disease Father   . Stroke Paternal Uncle   . Cancer Paternal Uncle     back   History  Substance Use Topics  . Smoking status: Former Smoker    Types: Cigarettes    Quit date: 04/24/1967  . Smokeless tobacco: Never Used  . Alcohol Use: No    Review of Systems  Constitutional: Negative for fever and chills.  Eyes: Negative for redness.  Respiratory: Negative for cough and shortness of breath.   Cardiovascular: Negative for chest pain.   Gastrointestinal: Positive for vomiting and blood in stool. Negative for nausea, abdominal pain and diarrhea.  Genitourinary: Negative for dysuria.  Skin: Negative for rash.  Neurological: Negative for headaches.  All other systems reviewed and are negative.     Allergies  Review of patient's allergies indicates no known allergies.  Home Medications   Prior to Admission medications   Medication Sig Start Date End Date Taking? Authorizing Provider  amLODipine (NORVASC) 10 MG tablet Take 10 mg by mouth every morning.    Historical Provider, MD  furosemide (LASIX) 40 MG tablet Take 40 mg by mouth daily as needed (For leg swelling.).    Historical Provider, MD  irbesartan (AVAPRO) 300 MG tablet Take 1 tablet (300 mg total) by mouth daily. 04/12/14   Donne Hazel, MD  metoprolol (LOPRESSOR) 50 MG tablet Take 1 tablet (50 mg total) by mouth 2 (two) times daily. 04/12/14   Donne Hazel, MD  potassium chloride (K-DUR,KLOR-CON) 10 MEQ tablet Take 20 mEq by mouth daily as needed (For potassium loss.).     Historical Provider, MD  pravastatin (PRAVACHOL) 40 MG tablet Take 40 mg by mouth. 04/13/13 04/13/17  Historical Provider, MD  thiamine 100 MG tablet Take 1 tablet (100 mg total) by mouth daily. 12/17/12   Bonnielee Haff, MD  BP 167/60 mmHg  Pulse 86  Temp(Src) 97.6 F (36.4 C) (Oral)  Resp 19  Ht 5\' 11"  (1.803 m)  Wt 255 lb (115.667 kg)  BMI 35.58 kg/m2  SpO2 100% Physical Exam  Constitutional: He is oriented to person, place, and time. No distress.  HENT:  Head: Normocephalic and atraumatic.  Eyes: EOM are normal. Pupils are equal, round, and reactive to light.  Neck: Normal range of motion. Neck supple.  Cardiovascular: Normal rate.   Pulmonary/Chest: Effort normal. No respiratory distress.  Abdominal: Soft. There is no tenderness. There is no rebound.  Genitourinary:  Dark stool FOB +  Musculoskeletal: Normal range of motion.  Neurological: He is alert and oriented to  person, place, and time.  Skin: No rash noted. He is not diaphoretic.  Psychiatric: He has a normal mood and affect.    ED Course  Procedures (including critical care time) Labs Review Labs Reviewed  CBC - Abnormal; Notable for the following:    WBC 13.4 (*)    RBC 1.91 (*)    Hemoglobin 4.8 (*)    HCT 14.8 (*)    MCV 77.5 (*)    MCH 25.1 (*)    RDW 19.1 (*)    All other components within normal limits  PROTIME-INR  COMPREHENSIVE METABOLIC PANEL  POC OCCULT BLOOD, ED  TYPE AND SCREEN    Imaging Review No results found.   EKG Interpretation   Date/Time:  Thursday July 22 2014 14:37:12 EDT Ventricular Rate:  90 PR Interval:  154 QRS Duration: 102 QT Interval:  382 QTC Calculation: 467 R Axis:   89 Text Interpretation:  Normal sinus rhythm Normal ECG Confirmed by BEATON   MD, ROBERT ([id number]) on 07/22/2014 3:10:45 PM      MDM   Final diagnoses:  None    Roger Wood is a 74 year old male with past medical history significant for hypertension, diabetes. Is also Jehovah's Witness which is significant.  Patient presents with a week now of dark stools and one day of dark emesis. Associated with this lightheadedness upon standing  Exam as above vital signs are all stable on his abdomen soft his dark stool on rectal exam that is guaiac positive.  Labs serum significant for hemoglobin of 4.8, INR of 1.11. That had 2 discussions with the patient about receiving blood products. Patient's devout Jehovah's Witness and adamantly refuses any blood products. The explained the risks of this including death, permanent disability. We'll give Protonix, octreotide, although patient does have no history of esophageal varices feel that she do all medical interventions possible aside from blood products given his very low hemoglobin.  Will give TXA.  Patient will be admitted to step down.  Stable at this time.    Jarome Matin, MD 07/22/14 Bosie Helper  Leonard Schwartz, MD 07/22/14  1845  Leonard Schwartz, MD 07/22/14 [phone]

## 2014-07-22 NOTE — ED Notes (Signed)
Patient states is throwing up blood since this morning and "the blood was very dark".  Patient states has been having dark bloody stools x 3 days.   Patient states 3 days ago he also had some numbness in L arm, but it resolved.   Patient states has been unable to eat, "i just didn't feel like it.  I feel weak.  I can't get up real good".

## 2014-07-22 NOTE — ED Notes (Signed)
Report attempted x2

## 2014-07-22 NOTE — ED Notes (Signed)
Family at bedside. 

## 2014-07-22 NOTE — Progress Notes (Signed)
[facility] Consult: 4:02 PM 07/22/2014     Referring Provider: ED Dr   Primary Care Physician:  Gearlean Alf., PA-C Primary Gastroenterologist:  unassigned     Reason for Consultation:  CG emesis and dark stool. Dizziness.  Acute on chronic anemia in a jehova's witness.    HPI: Roger Wood is a 74 y.o. male.  Type 2 DM (no meds on his list), sees Dr Moshe Cipro for CKD, gout (on alllopurinol in last several weeks).  Longstanding anemia followed by Dr Julien Nordmann and treated with parenteral iron about 6 months ago and epogen every 3 weeks.  However in the last 6 weeks, has not required the Epogen as Hgb 10.1 and 11.0.  He is [facility] so will not accept transfusion.   One week of positional dizziness and daily formed but black stool but no chest pain, no SOB, no syncope.  Appetite good, no nausea.  Then this AM new onset nausea and vomiting of coffee like material.  No abdominal pain Hgb is now 4.8.  BUN 109/2.9 c/w 53/2.8 in 03/2014.  Coags are normal. Sugar is 300.   Remote colonoscopy, EGD in [place] about 7 yrs ago.  No recall of any pathology.  Never had internal or GI bleeding issues. Takes 325 ASA but not other NSAIDs.   Started the ASA in past couple of months as a preventive agent. No PPI. Took 2 ASA a day sometimes in past few weeks.    Past Medical History  Diagnosis Date  . Hypertension   . Gout   . Arthritis   . History of radiation therapy 01/27/11-03/26/11    prostate  . Refusal of blood transfusions as patient is Jehovah's Witness   . Cataract     b/l catartact surgery  . Anemia     takes iron supplements  . Prostate cancer 07/14/10    dx  . Diabetes mellitus     adult onset   . Type II diabetes mellitus with nephropathy     Patient denies    History reviewed. No pertinent past surgical  history.  Prior to Admission medications   Medication Sig Start Date End Date Taking? Authorizing Provider  allopurinol (ZYLOPRIM) 100 MG tablet Take 100 mg by mouth. 06/16/14   Historical Provider, MD  amLODipine (NORVASC) 10 MG tablet Take 10 mg by mouth every morning.    Historical Provider, MD  furosemide (LASIX) 40 MG tablet Take 40 mg by mouth daily as needed (For leg swelling.).    Historical Provider, MD  hydrALAZINE (APRESOLINE) 50 MG tablet Take 50 mg by mouth 2 (two) times daily. 06/01/14   Historical Provider, MD  irbesartan (AVAPRO) 300 MG tablet Take 1 tablet (300 mg total) by mouth daily. 04/12/14   Donne Hazel, MD  metoprolol (LOPRESSOR) 50 MG tablet Take 1 tablet (50 mg total) by mouth 2 (two) times daily. 04/12/14   Donne Hazel, MD  metoprolol succinate (TOPROL-XL) 50 MG 24 hr tablet Take 50 mg by mouth daily. 07/02/14   Historical Provider, MD  potassium chloride (  K-DUR,KLOR-CON) 10 MEQ tablet Take 20 mEq by mouth daily as needed (For potassium loss.).     Historical Provider, MD  pravastatin (PRAVACHOL) 40 MG tablet Take 40 mg by mouth. 04/13/13 04/13/17  Historical Provider, MD  thiamine 100 MG tablet Take 1 tablet (100 mg total) by mouth daily. 12/17/12   Bonnielee Haff, MD    Scheduled Meds: . tranexamic acid  500 mg Topical Once   Infusions: . tranexamic acid (CYLOKAPRON) IVPB (TRAUMA)     Followed by  . tranexamic acid (CYKLOKAPRON) infusion (TRAUMA)     PRN Meds:    Allergies as of 07/22/2014  . (No Known Allergies)    Family History  Problem Relation Age of Onset  . Breast cancer Sister   . Breast cancer Paternal Aunt   . Colon cancer Neg Hx   . Diabetes Mother   . Diabetes Sister   . Diabetes Paternal Aunt   . Heart disease Father   . Stroke Paternal Uncle   . Cancer Paternal Uncle     back    History   Social History  . Marital Status: Married    Spouse Name: N/A  . Number of Children: 5  . Years of Education: N/A   Occupational  History  . Semi-retired Administrator    Social History Main Topics  . Smoking status: Former Smoker    Types: Cigarettes    Quit date: 04/24/1967  . Smokeless tobacco: Never Used  . Alcohol Use: No  . Drug Use: No     Comment: 2 packs weekly,quit 30 years ago  . Sexual Activity: Not on file   Other Topics Concern  . Not on file   Social History Narrative   The Patient is a Sales promotion account executive Witness    REVIEW OF SYSTEMS: Constitutional:  Per HPI ENT:  No nose bleeds Pulm:  Per HPI CV:  No palpitations, no LE edema. Per HPI GU:  No hematuria, no frequency GI:  Per HPI Heme:  Per HPI   Transfusions:  never Neuro:  No headaches, no peripheral tingling or numbness Derm:  No itching, no rash or sores.  Endocrine:  No sweats or chills.  No polyuria or dysuria Immunization:  Not queried.  Travel:  None beyond local counties in last few months.    PHYSICAL EXAM: Vital signs in last 24 hours: Filed Vitals:   07/22/14 1545  BP: 190/59  Pulse: 78  Temp:   Resp:    Wt Readings from Last 3 Encounters:  07/22/14 255 lb (115.667 kg)  06/30/14 256 lb 4.8 oz (116.257 kg)  04/09/14 253 lb 8.5 oz (115 kg)    General: pleasant, alert, comfortable Head:  No swelling, no trauma  Eyes:  No icterus.  + pallor Ears:  Not HOH  Nose:  No discharge Mouth:  Clear, moist Neck:  No mass, no JVD Lungs:  Clear bil.  No labored breathing or cough Heart: RRR Abdomen:  Soft, NT, obese, active BS.  Marland Kitchen   Rectal: deferred. Dark and FOBT + per ED staff   Musc/Skeltl: no joint swelling or redness Extremities:  No CCE  Neurologic:  Oriented x 3, moves all 4 limbs, no tremor,  Skin:  No rash or sores Tattoos:  none   Psych:  Cooperative, pleasant.       LAB RESULTS:  Recent Labs  07/22/14 1426  WBC 13.4*  HGB 4.8*  HCT 14.8*  PLT 304   BMET Lab Results  Component Value Date  NA 134* 07/22/2014   NA 136* 04/12/2014   NA 134* 04/11/2014   K 4.9 07/22/2014   K 4.8 04/12/2014   K  4.7 04/11/2014   CL 104 07/22/2014   CL 102 04/12/2014   CL 101 04/11/2014   CO2 18* 07/22/2014   CO2 19 04/12/2014   CO2 19 04/11/2014   GLUCOSE 302* 07/22/2014   GLUCOSE 140* 04/12/2014   GLUCOSE 178* 04/11/2014   BUN 109* 07/22/2014   BUN 53* 04/12/2014   BUN 51* 04/11/2014   CREATININE 2.97* 07/22/2014   CREATININE 2.82* 04/12/2014   CREATININE 2.88* 04/11/2014   CALCIUM 8.3* 07/22/2014   CALCIUM 8.6 04/12/2014   CALCIUM 8.3* 04/11/2014   LFT  Recent Labs  07/22/14 1426  PROT 5.1*  ALBUMIN 2.6*  AST 10  ALT 14  ALKPHOS 53  BILITOT 0.4   PT/INR Lab Results  Component Value Date   INR 1.11 07/22/2014   INR 1.05 02/11/2013   INR 1.23 01/13/2011     ENDOSCOPIC STUDIES: Per HPI  IMPRESSION:   *  GIB, upper.  Rule out ulcers, AVMs, variceal bleed unlikely but hx mild hepatic steatosis on 08/2010 ultrasound.    *  ABL anemia.  Hx chronic anemia.  As he is [facility], blood transfusion off the table.   *  CKD  *  NIDDM.  Glucose 300.   A1C 9.3 in 03/2014.     PLAN:     *  EGD orders in depot.  *  PPI. IV BID.      Azucena Freed  07/22/2014, 4:02 PM Pager: [phone]   [facility] Attending  I have also seen and assessed the patient and agree with the advanced practitioner's assessment and plan. Suspect upper GI bleed. Most likely gastritis vs ulcers or both from ASA. Plan for EGD tomorrow. Agree with supportive care re: anemia as he will not take blood. The risks and benefits as well as alternatives of endoscopic procedure(s) have been discussed and reviewed. All questions answered. The patient agrees to proceed.  Gatha Mayer, MD, [facility] [phone] (pager) 07/22/2014 5:32 PM

## 2014-07-22 NOTE — ED Notes (Signed)
Pharm. Called to receive octreotide

## 2014-07-23 ENCOUNTER — Encounter (HOSPITAL_COMMUNITY)
Admission: EM | Disposition: A | Discharge status: Skilled Nursing Facility | Payer: Self-pay | Source: Home / Self Care | Attending: Family Medicine

## 2014-07-23 ENCOUNTER — Encounter (HOSPITAL_COMMUNITY): Payer: Self-pay | Admitting: *Deleted

## 2014-07-23 DIAGNOSIS — K922 Gastrointestinal hemorrhage, unspecified: Secondary | ICD-10-CM

## 2014-07-23 DIAGNOSIS — D62 Acute posthemorrhagic anemia: Secondary | ICD-10-CM

## 2014-07-23 HISTORY — PX: ESOPHAGOGASTRODUODENOSCOPY: SHX5428

## 2014-07-23 LAB — GLUCOSE, CAPILLARY
GLUCOSE-CAPILLARY: 226 mg/dL — AB (ref 70–99)
Glucose-Capillary: 247 mg/dL — ABNORMAL HIGH (ref 70–99)
Glucose-Capillary: 225 mg/dL — ABNORMAL HIGH (ref 70–99)
Glucose-Capillary: 267 mg/dL — ABNORMAL HIGH (ref 70–99)

## 2014-07-23 LAB — CBC
HEMATOCRIT: 12.6 % — AB (ref 39.0–52.0)
HEMOGLOBIN: 4 g/dL — AB (ref 13.0–17.0)
MCH: 25 pg — AB (ref 26.0–34.0)
MCHC: 31.7 g/dL (ref 30.0–36.0)
MCV: 78.8 fL (ref 78.0–100.0)
Platelets: 245 10*3/uL (ref 150–400)
RBC: 1.6 MIL/uL — ABNORMAL LOW (ref 4.22–5.81)
RDW: 19.6 % — AB (ref 11.5–15.5)
WBC: 10.9 10*3/uL — ABNORMAL HIGH (ref 4.0–10.5)

## 2014-07-23 LAB — PROTIME-INR
INR: 1.13 (ref 0.00–1.49)
Prothrombin Time: 14.7 seconds (ref 11.6–15.2)

## 2014-07-23 LAB — TSH: TSH: 1.99 u[IU]/mL (ref 0.350–4.500)

## 2014-07-23 SURGERY — EGD (ESOPHAGOGASTRODUODENOSCOPY)
Anesthesia: Moderate Sedation

## 2014-07-23 MED ORDER — ONDANSETRON HCL 4 MG/2ML IJ SOLN
4.0000 mg | Freq: Four times a day (QID) | INTRAMUSCULAR | Status: DC | PRN
  Administered 2014-07-23: 4 mg via INTRAVENOUS
  Filled 2014-07-23: qty 2

## 2014-07-23 MED ORDER — FENTANYL CITRATE 0.05 MG/ML IJ SOLN
INTRAMUSCULAR | Status: AC
  Filled 2014-07-23: qty 2

## 2014-07-23 MED ORDER — BUTAMBEN-TETRACAINE-BENZOCAINE 2-2-14 % EX AERO
INHALATION_SPRAY | CUTANEOUS | Status: DC | PRN
  Administered 2014-07-23: 2 via TOPICAL

## 2014-07-23 MED ORDER — SODIUM CHLORIDE 0.9 % IV SOLN: INTRAVENOUS | Status: DC

## 2014-07-23 MED ORDER — EPINEPHRINE HCL 0.1 MG/ML IJ SOSY
PREFILLED_SYRINGE | INTRAMUSCULAR | Status: AC
  Filled 2014-07-23: qty 10

## 2014-07-23 MED ORDER — MIDAZOLAM HCL 10 MG/2ML IJ SOLN
INTRAMUSCULAR | Status: DC | PRN
  Administered 2014-07-23: 2 mg via INTRAVENOUS
  Administered 2014-07-23: 1 mg via INTRAVENOUS

## 2014-07-23 MED ORDER — INSULIN ASPART 100 UNIT/ML ~~LOC~~ SOLN
0.0000 [IU] | SUBCUTANEOUS | Status: DC
  Administered 2014-07-23: 5 [IU] via SUBCUTANEOUS
  Administered 2014-07-23: 3 [IU] via SUBCUTANEOUS
  Administered 2014-07-24: 1 [IU] via SUBCUTANEOUS
  Administered 2014-07-24 (×3): 2 [IU] via SUBCUTANEOUS

## 2014-07-23 MED ORDER — SODIUM CHLORIDE 0.9 % IV SOLN
80.0000 mg | Freq: Once | INTRAVENOUS | Status: AC
  Administered 2014-07-23: 80 mg via INTRAVENOUS
  Filled 2014-07-23: qty 80

## 2014-07-23 MED ORDER — FENTANYL CITRATE 0.05 MG/ML IJ SOLN
INTRAMUSCULAR | Status: DC | PRN
  Administered 2014-07-23: 25 ug via INTRAVENOUS

## 2014-07-23 MED ORDER — DIPHENHYDRAMINE HCL 50 MG/ML IJ SOLN
INTRAMUSCULAR | Status: AC
  Filled 2014-07-23: qty 1

## 2014-07-23 MED ORDER — PANTOPRAZOLE SODIUM 40 MG IV SOLR
40.0000 mg | Freq: Two times a day (BID) | INTRAVENOUS | Status: DC
  Filled 2014-07-23: qty 40

## 2014-07-23 MED ORDER — SODIUM CHLORIDE 0.9 % IJ SOLN
PREFILLED_SYRINGE | INTRAMUSCULAR | Status: DC | PRN
  Administered 2014-07-23: 8 mL

## 2014-07-23 MED ORDER — MIDAZOLAM HCL 5 MG/ML IJ SOLN
INTRAMUSCULAR | Status: AC
  Filled 2014-07-23: qty 2

## 2014-07-23 MED ORDER — SODIUM CHLORIDE 0.9 % IV SOLN
8.0000 mg/h | INTRAVENOUS | Status: AC
  Administered 2014-07-23 – 2014-07-26 (×6): 8 mg/h via INTRAVENOUS
  Filled 2014-07-23 (×10): qty 80

## 2014-07-23 NOTE — Progress Notes (Signed)
Noted that patient has Type 2 diabetes. No meds listed. Recommend checking CBGs every 4 hours while NPO and then TID & HS when eating.  Start Novolog SENSITIVE correction scale every 4 hours while NPO. Will continue to follow while in hospital. Harvel Ricks RN BSN CDE

## 2014-07-23 NOTE — Brief Op Note (Signed)
07/22/2014 - 07/23/2014  5:36 PM  PATIENT:  Roger Wood  74 y.o. male  PRE-OPERATIVE DIAGNOSIS:  gi bleed, anemia  POST-OPERATIVE DIAGNOSIS:  active bleeding from proximal stomach; treated with clips (2) and epi injection  PROCEDURE:  Procedure(s): ESOPHAGOGASTRODUODENOSCOPY (EGD) (N/A) with bleeding control  SURGEON:  Surgeon(s) and Role:    * Gatha Mayer, MD - Primary  Meds: Fentanyl 25 ug and Versed 3 mg IV  Findings:  1) Actively bleeding lesion in proximal stomach - near or in cardia vs. Fundus. Pulsatile ooze with small crevice in the mucosa.Treated with 2 clips and EPI x 8 cc. Bleeding stopped. 2) No other abnormalities.  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  Rec:  PPI infusion - ordered Clears only for now Pediatric blood draw tubes  Gatha Mayer, MD, [facility] [phone] (pager) 07/23/2014 5:45 PM

## 2014-07-23 NOTE — Progress Notes (Signed)
[place] TEAM 1 - Stepdown/ICU TEAM Progress Note  Roger Wood [id number]:[id number] DOB: 1940/07/25 DOA: 07/22/2014 PCP: Gearlean Alf., PA-C  Admit HPI / Brief Narrative: 74 y.o. male with a history of CKD stage III, MGUS, and HTN who came to the hospital complaining of hematemesis. Patient follows with Dr. 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  He gets erythropoietin and iron infusion intermittently.  He had dark stools for 1 week and then developed dizziness and early fatigability.  He vomited coffee-ground emesis twice on the day of his admission so he came into the ED.  In the ED the patient was found to have a hemoglobin of 4.8, w/ last hemoglobin 11 on 06/23/2014. 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    HPI/Subjective: Pt has no complaints apart from modest nausea.  He denies cp, vomiting, abdom pain, or sob.    Assessment/Plan:  Upper GI bleed / hematemesis  patient on aspirin w/ no other NSAIDs use - cont protonix - GI to take for EGD today   Acute blood loss anemia on chronic anemia  Hemoglobin baseline 11 about 3 weeks prior to admit - hemoglobin 4.8 at admission - patient is a Jehovah's Witness and does not accept blood transfusion - minimize blood draws w/ only daily CBC - 1025 mg of InFeD, Aranesp 200 g given at time of admission - Hgb continues to decline  CKD stage III baseline crt appears to be 2.5-2.9 - follow w/ gentle hydration   MGUS   DM2 (but pt denies) A1c 9.3 in Dec 2015 and 8.9 in 2014 therefore pt DOES HAVE DM - I have explained this to him and he acknowledges this information - begin SSI and follow - will need meds at time of d/c   Prostate CA S/p XRT  Hypertension - uncontrolled BP remains quite elevated, despite severe anemia - carefully adjust meds and follow   HLD  Hold med tx until oral intake consistent   Code Status: FULL Family Communication: spoke w/  wife and ?duaghter at bedside  Disposition Plan: SDU  Consultants: [facility]   Procedures: EGD - 4/1 - pending   Antibiotics: none  DVT prophylaxis: SCDs  Objective: Blood pressure 158/55, pulse 70, temperature 97.6 F (36.4 C), temperature source Oral, resp. rate 11, height 5\' 11"  (1.803 m), weight 109.1 kg (240 lb 8.4 oz), SpO2 100 %.  Intake/Output Summary (Last 24 hours) at 07/23/14 1333 Last data filed at 07/23/14 0500  Gross per 24 hour  Intake 920.83 ml  Output   1500 ml  Net -579.17 ml   Exam: General: No acute respiratory distress Lungs: Clear to auscultation bilaterally without wheezes or crackles Cardiovascular: Regular rate and rhythm without murmur gallop or rub normal S1 and S2 Abdomen: Nontender, nondistended, soft, bowel sounds positive, no rebound, no ascites, no appreciable mass Extremities: No significant cyanosis, clubbing, or edema bilateral lower extremities  Data Reviewed: Basic Metabolic Panel:  Recent Labs Lab 07/22/14 1426  NA 134*  K 4.9  CL 104  CO2 18*  GLUCOSE 302*  BUN 109*  CREATININE 2.97*  CALCIUM 8.3*    Liver Function Tests:  Recent Labs Lab 07/22/14 1426  AST 10  ALT 14  ALKPHOS 53  BILITOT 0.4  PROT 5.1*  ALBUMIN 2.6*   Coags:  Recent Labs Lab 07/22/14 1426 07/23/14 0007  INR 1.11 1.13   CBC:  Recent Labs Lab 07/22/14  1426 07/23/14 0350  WBC 13.4* 10.9*  HGB 4.8* 4.0*  HCT 14.8* 12.6*  MCV 77.5* 78.8  PLT 304 245    CBG: No results for input(s): GLUCAP in the last 168 hours.   Studies:  Recent x-ray studies have been reviewed in detail by the Attending Physician  Scheduled Meds:  Scheduled Meds: . amLODipine  10 mg Oral q morning - 10a  . hydrALAZINE  50 mg Oral BID  . metoprolol succinate  50 mg Oral Daily  . pantoprazole (PROTONIX) IV  40 mg Intravenous Q12H  . sodium chloride  3 mL Intravenous Q12H    Time spent on care of this patient: 35 mins   Harlee Pursifull T , MD    [facility] Office  [phone] Pager - Text Page per Shea Evans as per below:  On-Call/Text Page:      [email or web address]      password [email or web address]  If 7PM-7AM, please contact night-coverage [email or web address] Password [email or web address] 07/23/2014, 1:33 PM   LOS: 1 day

## 2014-07-23 NOTE — Progress Notes (Signed)
Utilization Review Completed.  

## 2014-07-24 DIAGNOSIS — R110 Nausea: Secondary | ICD-10-CM

## 2014-07-24 LAB — BASIC METABOLIC PANEL
ANION GAP: 5 (ref 5–15)
BUN: 87 mg/dL — AB (ref 6–23)
CO2: 21 mmol/L (ref 19–32)
Calcium: 8 mg/dL — ABNORMAL LOW (ref 8.4–10.5)
Chloride: 111 mmol/L (ref 96–112)
Creatinine, Ser: 3.11 mg/dL — ABNORMAL HIGH (ref 0.50–1.35)
GFR calc non Af Amer: 18 mL/min — ABNORMAL LOW (ref >90)
GFR, EST AFRICAN AMERICAN: 21 mL/min — AB (ref >90)
GLUCOSE: 194 mg/dL — AB (ref 70–99)
POTASSIUM: 4.8 mmol/L (ref 3.5–5.1)
Sodium: 137 mmol/L (ref 135–145)

## 2014-07-24 LAB — GLUCOSE, CAPILLARY
GLUCOSE-CAPILLARY: 171 mg/dL — AB (ref 70–99)
GLUCOSE-CAPILLARY: 180 mg/dL — AB (ref 70–99)
GLUCOSE-CAPILLARY: 199 mg/dL — AB (ref 70–99)
Glucose-Capillary: 199 mg/dL — ABNORMAL HIGH (ref 70–99)
Glucose-Capillary: 198 mg/dL — ABNORMAL HIGH (ref 70–99)

## 2014-07-24 LAB — CBC
HEMATOCRIT: 12.7 % — AB (ref 39.0–52.0)
Hemoglobin: 4 g/dL — CL (ref 13.0–17.0)
MCH: 25.8 pg — ABNORMAL LOW (ref 26.0–34.0)
MCHC: 31.5 g/dL (ref 30.0–36.0)
MCV: 81.9 fL (ref 78.0–100.0)
Platelets: 266 10*3/uL (ref 150–400)
RBC: 1.55 MIL/uL — ABNORMAL LOW (ref 4.22–5.81)
RDW: 21.6 % — AB (ref 11.5–15.5)
WBC: 13.7 10*3/uL — ABNORMAL HIGH (ref 4.0–10.5)

## 2014-07-24 LAB — HEMOGLOBIN A1C
HEMOGLOBIN A1C: 9.2 % — AB (ref 4.8–5.6)
MEAN PLASMA GLUCOSE: 217 mg/dL

## 2014-07-24 MED ORDER — METOPROLOL SUCCINATE ER 100 MG PO TB24
100.0000 mg | ORAL_TABLET | Freq: Every day | ORAL | Status: DC
  Administered 2014-07-25 – 2014-08-06 (×13): 100 mg via ORAL
  Filled 2014-07-24 (×13): qty 1

## 2014-07-24 MED ORDER — ONDANSETRON HCL 4 MG/2ML IJ SOLN
4.0000 mg | Freq: Four times a day (QID) | INTRAMUSCULAR | Status: DC | PRN
  Administered 2014-07-24 (×2): 4 mg via INTRAVENOUS
  Filled 2014-07-24 (×2): qty 2

## 2014-07-24 MED ORDER — INSULIN ASPART 100 UNIT/ML ~~LOC~~ SOLN
0.0000 [IU] | Freq: Three times a day (TID) | SUBCUTANEOUS | Status: DC
  Administered 2014-07-24 – 2014-07-25 (×2): 2 [IU] via SUBCUTANEOUS
  Administered 2014-07-25: 1 [IU] via SUBCUTANEOUS
  Administered 2014-07-25: 2 [IU] via SUBCUTANEOUS
  Administered 2014-07-26: 3 [IU] via SUBCUTANEOUS
  Administered 2014-07-26: 2 [IU] via SUBCUTANEOUS
  Administered 2014-07-27: 1 [IU] via SUBCUTANEOUS
  Administered 2014-07-27 – 2014-07-28 (×4): 2 [IU] via SUBCUTANEOUS
  Administered 2014-07-28: 1 [IU] via SUBCUTANEOUS
  Administered 2014-07-29 (×2): 2 [IU] via SUBCUTANEOUS
  Administered 2014-07-29 – 2014-07-30 (×2): 1 [IU] via SUBCUTANEOUS
  Administered 2014-07-30 (×2): 2 [IU] via SUBCUTANEOUS
  Administered 2014-07-31: 1 [IU] via SUBCUTANEOUS
  Administered 2014-07-31: 2 [IU] via SUBCUTANEOUS
  Administered 2014-07-31 – 2014-08-01 (×2): 1 [IU] via SUBCUTANEOUS
  Administered 2014-08-01 – 2014-08-02 (×3): 2 [IU] via SUBCUTANEOUS
  Administered 2014-08-02 – 2014-08-03 (×2): 1 [IU] via SUBCUTANEOUS
  Administered 2014-08-03: 2 [IU] via SUBCUTANEOUS
  Administered 2014-08-03: 1 [IU] via SUBCUTANEOUS
  Administered 2014-08-04: 2 [IU] via SUBCUTANEOUS
  Administered 2014-08-04: 3 [IU] via SUBCUTANEOUS
  Administered 2014-08-04 – 2014-08-05 (×2): 1 [IU] via SUBCUTANEOUS
  Administered 2014-08-05: 3 [IU] via SUBCUTANEOUS
  Administered 2014-08-05 – 2014-08-06 (×2): 1 [IU] via SUBCUTANEOUS

## 2014-07-24 NOTE — Op Note (Signed)
[facility] [place], [address]   ENDOSCOPY PROCEDURE REPORT  PATIENT: Roger Wood, Roger Wood  MR#: [id number] BIRTHDATE: 05/11/1940 , 32  yrs. old GENDER: male ENDOSCOPIST: Gatha Mayer, MD, [facility] PROCEDURE DATE:  07/23/2014 PROCEDURE:  EGD w/ control of bleeding ASA CLASS:     Class III INDICATIONS:  melena. MEDICATIONS: Fentanyl 25 mcg IV and Versed 3 mg IV TOPICAL ANESTHETIC: Cetacaine Spray  DESCRIPTION OF PROCEDURE: After the risks benefits and alternatives of the procedure were thoroughly explained, informed consent was obtained.  The Pentax Gastroscope [id number] endoscope was introduced through the mouth and advanced to the second portion of the duodenum , Without limitations.  The instrument was slowly withdrawn as the mucosa was fully examined.    1) Actively bleeding lesion in proximal stomach - near or in cardia vs.  Fundus.  Pulsatile ooze with small crevice in the mucosa.Treated with 2 clips and EPI x 8 cc.  Bleeding stopped. 2) No other abnormalities.  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  Retroflexed views revealed as previously described.     The scope was then withdrawn from the patient and the procedure completed.  COMPLICATIONS: There were no immediate complications.  ENDOSCOPIC IMPRESSION: 1) Actively bleeding lesion in proximal stomach - near or in cardia vs.  Fundus.  Pulsatile ooze with small crevice in the mucosa.Treated with 2 clips and EPI x 8 cc.  Bleeding stopped. 2) No other abnormalities.  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

## 2014-07-24 NOTE — Progress Notes (Signed)
[place] TEAM 1 - Stepdown/ICU TEAM Progress Note  MERIL DRAY TMH:[id number] DOB: November 19, 1940 DOA: 07/22/2014 PCP: Gearlean Alf., PA-C  Admit HPI / Brief Narrative: 74 y.o. male with a history of CKD stage III, MGUS, and HTN who came to the hospital complaining of hematemesis. Patient follows with Dr. 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  He gets erythropoietin and iron infusion intermittently.  He had dark stools for 1 week and then developed dizziness and early fatigability.  He vomited coffee-ground emesis twice on the day of his admission so he came into the ED.  In the ED the patient was found to have a hemoglobin of 4.8, w/ last hemoglobin 11 on 06/23/2014. 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    HPI/Subjective: Pt c/o severe nausea today with any attempt to eat or drink.  Denies cp, sob, vomiting, or HA.  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 patient on aspirin w/ no other NSAIDs use - cont protonix now via gtt - tx w/ 2 clips and epi injection during EGD 4/1  Acute blood loss anemia on chronic anemia  Hemoglobin baseline 11 about 3 weeks prior to admit - hemoglobin 4.8 at admission - patient is a Jehovah's Witness and does not accept blood transfusion - minimize blood draws w/ only daily CBC - 1025 mg of InFeD, Aranesp 200 g given at time of admission - Hgb appears to have stabilized at 4.0 - follow in serial fashion  Intractable Nausea ?related to Fe load in bowel, with no recent BM - stimulate bowels as able and follow - limit diet for now to avoid wretching/vomiting   CKD stage III baseline crt appears to be 2.5-2.9 - crt slightly higher today - follow   MGUS   DM2 (pt previously denied) A1c 9.3 in Dec 2015 and 8.9 in 2014 therefore pt DOES HAVE DM - I have explained this to him and he acknowledges this information - SSI - w/ diet tolerance will add  oral tx and follow   Prostate CA S/p XRT  Hypertension - uncontrolled BP remains elevated, despite severe anemia - carefully adjust meds again and follow - anxiety may be playing a role   HLD  Hold med tx until oral intake consistent   Code Status: FULL Family Communication: spoke w/ wife at bedside  Disposition Plan: SDU  Consultants: [facility]   Procedures: EGD - 4/1 - results noted above    Antibiotics: none  DVT prophylaxis: SCDs  Objective: Blood pressure 153/51, pulse 70, temperature 97.5 F (36.4 C), temperature source Oral, resp. rate 15, height 5\' 11"  (1.803 m), weight 109.1 kg (240 lb 8.4 oz), SpO2 100 %.  Intake/Output Summary (Last 24 hours) at 07/24/14 1506 Last data filed at 07/24/14 1223  Gross per 24 hour  Intake      0 ml  Output   1930 ml  Net  -1930 ml   Exam: General: No acute respiratory distress Lungs: Clear to auscultation bilaterally without wheezes or crackles Cardiovascular: Regular rate and rhythm without murmur gallop or rub  Abdomen: Nontender, nondistended, soft, bowel sounds positive, no rebound, no ascites, no appreciable mass Extremities: No significant cyanosis, clubbing, or edema bilateral lower extremities  Data Reviewed: Basic Metabolic Panel:  Recent Labs Lab 07/22/14 1426 07/24/14 0253  NA 134* 137  K 4.9 4.8  CL 104 111  CO2 18* 21  GLUCOSE  302* 194*  BUN 109* 87*  CREATININE 2.97* 3.11*  CALCIUM 8.3* 8.0*    Liver Function Tests:  Recent Labs Lab 07/22/14 1426  AST 10  ALT 14  ALKPHOS 53  BILITOT 0.4  PROT 5.1*  ALBUMIN 2.6*   Coags:  Recent Labs Lab 07/22/14 1426 07/23/14 0007  INR 1.11 1.13   CBC:  Recent Labs Lab 07/22/14 1426 07/23/14 0350 07/24/14 0253  WBC 13.4* 10.9* 13.7*  HGB 4.8* 4.0* 4.0*  HCT 14.8* 12.6* 12.7*  MCV 77.5* 78.8 81.9  PLT 304 245 266    CBG:  Recent Labs Lab 07/23/14 2109 07/23/14 2352 07/24/14 0304 07/24/14 0744 07/24/14 1225  GLUCAP 267* 226*  171* 180* 199*    Studies:  Recent x-ray studies have been reviewed in detail by the Attending Physician  Scheduled Meds:  Scheduled Meds: . amLODipine  10 mg Oral q morning - 10a  . hydrALAZINE  50 mg Oral BID  . insulin aspart  0-9 Units Subcutaneous 6 times per day  . metoprolol succinate  50 mg Oral Daily  . [START ON 07/27/2014] pantoprazole (PROTONIX) IV  40 mg Intravenous Q12H  . sodium chloride  3 mL Intravenous Q12H    Time spent on care of this patient: 35 mins   Jamon Hayhurst T , MD   [facility] Office  [phone] Pager - Text Page per Shea Evans as per below:  On-Call/Text Page:      [email or web address]      password [email or web address]  If 7PM-7AM, please contact night-coverage [email or web address] Password [email or web address] 07/24/2014, 3:06 PM   LOS: 2 days

## 2014-07-24 NOTE — Progress Notes (Addendum)
   Patient Name: Roger Wood Date of Encounter: 07/24/2014, 8:54 AM    Subjective  Nauseous but not vomiting  Denies anxiety but wife thinks he may be nervous/scared No abdominal pain    Objective  BP 165/52 mmHg  Pulse 64  Temp(Src) 98.1 F (36.7 C) (Oral)  Resp 14  Ht 5\' 11"  (1.803 m)  Wt 240 lb 8.4 oz (109.1 kg)  BMI 33.56 kg/m2  SpO2 100% Abd is soft and NT Alert and appropriate  CBC Latest Ref Rng 07/24/2014 07/23/2014 07/22/2014  WBC 4.0 - 10.5 K/uL 13.7(H) 10.9(H) 13.4(H)  Hemoglobin 13.0 - 17.0 g/dL 4.0(LL) 4.0(LL) 4.8(LL)  Hematocrit 39.0 - 52.0 % 12.7(L) 12.6(L) 14.8(L)  Platelets 150 - 400 K/uL 266 245 304      Assessment and Plan  1) Gastric bleed - ? Dieaulafoy's lesion - s/p clipping and EPI - Hgb stable 2) Acute blood loss anemia - stable 3) Nausea unclear etiology - do not think from meds, ? Anxious, should not be from gastric abnormality., BS are somewhat high ? Related to hyperglycemia    - Carb mod diet - continuous PPI again today - ondansetron ordered - will f/u tomorrow  Gatha Mayer, MD, [facility] [phone] (pager) 07/24/2014 8:54 AM

## 2014-07-25 LAB — CBC
HEMATOCRIT: 11.8 % — AB (ref 39.0–52.0)
HEMOGLOBIN: 3.7 g/dL — AB (ref 13.0–17.0)
MCH: 26.4 pg (ref 26.0–34.0)
MCHC: 31.4 g/dL (ref 30.0–36.0)
MCV: 84.3 fL (ref 78.0–100.0)
PLATELETS: 260 10*3/uL (ref 150–400)
RBC: 1.4 MIL/uL — AB (ref 4.22–5.81)
RDW: 23.1 % — ABNORMAL HIGH (ref 11.5–15.5)
WBC: 10.5 10*3/uL (ref 4.0–10.5)

## 2014-07-25 LAB — COMPREHENSIVE METABOLIC PANEL
ALT: 11 U/L (ref 0–53)
AST: 11 U/L (ref 0–37)
Albumin: 2.4 g/dL — ABNORMAL LOW (ref 3.5–5.2)
Alkaline Phosphatase: 49 U/L (ref 39–117)
Anion gap: 7 (ref 5–15)
BILIRUBIN TOTAL: 0.5 mg/dL (ref 0.3–1.2)
BUN: 56 mg/dL — AB (ref 6–23)
CHLORIDE: 110 mmol/L (ref 96–112)
CO2: 20 mmol/L (ref 19–32)
Calcium: 7.9 mg/dL — ABNORMAL LOW (ref 8.4–10.5)
Creatinine, Ser: 3.05 mg/dL — ABNORMAL HIGH (ref 0.50–1.35)
GFR calc Af Amer: 22 mL/min — ABNORMAL LOW (ref >90)
GFR, EST NON AFRICAN AMERICAN: 19 mL/min — AB (ref >90)
Glucose, Bld: 161 mg/dL — ABNORMAL HIGH (ref 70–99)
Potassium: 4.6 mmol/L (ref 3.5–5.1)
SODIUM: 137 mmol/L (ref 135–145)
TOTAL PROTEIN: 4.9 g/dL — AB (ref 6.0–8.3)

## 2014-07-25 LAB — GLUCOSE, CAPILLARY
Glucose-Capillary: 147 mg/dL — ABNORMAL HIGH (ref 70–99)
Glucose-Capillary: 191 mg/dL — ABNORMAL HIGH (ref 70–99)
Glucose-Capillary: 187 mg/dL — ABNORMAL HIGH (ref 70–99)
Glucose-Capillary: 199 mg/dL — ABNORMAL HIGH (ref 70–99)

## 2014-07-25 MED ORDER — PIOGLITAZONE HCL 15 MG PO TABS
15.0000 mg | ORAL_TABLET | Freq: Every day | ORAL | Status: DC
  Administered 2014-07-26 – 2014-08-06 (×12): 15 mg via ORAL
  Filled 2014-07-25 (×12): qty 1

## 2014-07-25 MED ORDER — OXYCODONE HCL 5 MG PO TABS
5.0000 mg | ORAL_TABLET | ORAL | Status: DC | PRN
  Administered 2014-07-31 – 2014-08-03 (×3): 5 mg via ORAL
  Administered 2014-08-03 – 2014-08-05 (×4): 10 mg via ORAL
  Filled 2014-07-25: qty 2
  Filled 2014-07-25 (×3): qty 1
  Filled 2014-07-25 (×4): qty 2

## 2014-07-25 MED ORDER — ACETAMINOPHEN-CODEINE #3 300-30 MG PO TABS: 1.0000 | ORAL_TABLET | Freq: Four times a day (QID) | ORAL | Status: DC | PRN

## 2014-07-25 NOTE — Clinical Documentation Improvement (Signed)
"  Stage 3 CKD" documented in current medical record.  Please document if a condition below provides greater specificity regarding the patient's renal function this admission and also over the past 2 years per [facility].   - Stage 4 CKD  (GFR 15 to 29)  - Stage 3 CKD as documented  - Other Stage  - Unable to Clinically Determine  Thank You, Erling Conte ,RN Clinical Documentation Specialist:  [phone] [facility]

## 2014-07-25 NOTE — Progress Notes (Signed)
     [facility] Progress Note  Subjective:   Hgb today 3.7, Hct 11.8.Hungry. Denies nausea. Feels weak.DEnies SOB or CP. No stools Objective:  Vital signs in last 24 hours: Temp:  [97.5 F (36.4 C)-98.4 F (36.9 C)] 98.1 F (36.7 C) (04/03 1121) Pulse Rate:  [69-78] 72 (04/03 1121) Resp:  [10-16] 16 (04/03 1121) BP: (145-159)/(34-54) 156/49 mmHg (04/03 1121) SpO2:  [99 %-100 %] 99 % (04/03 1121) Weight:  [253 lb 4.9 oz (114.9 kg)] 253 lb 4.9 oz (114.9 kg) (04/03 0358) Last BM Date: 07/21/14 General:   Alert,  Well-developed, in NAD Heart:  Regular rate and rhythm; no murmurs Pulm;lungs clear Abdomen:  Soft, nontender and nondistended. Normal bowel sounds, without guarding, and without rebound.   Extremities:  Without edema. Neurologic:  Alert and  oriented x4;  grossly normal neurologically. Psych:  Alert and cooperative. Normal mood and affect.  Lab Results:  Recent Labs  07/23/14 0350 07/24/14 0253 07/25/14 0530  WBC 10.9* 13.7* 10.5  HGB 4.0* 4.0* 3.7*  HCT 12.6* 12.7* 11.8*  PLT 245 266 260   BMET  Recent Labs  07/22/14 1426 07/24/14 0253 07/25/14 0530  NA 134* 137 137  K 4.9 4.8 4.6  CL 104 111 110  CO2 18* 21 20  GLUCOSE 302* 194* 161*  BUN 109* 87* 56*  CREATININE 2.97* 3.11* 3.05*  CALCIUM 8.3* 8.0* 7.9*    Endoscopies: EGD 07/24/14: ENDOSCOPIC IMPRESSION: 1) Actively bleeding lesion in proximal stomach - near or in cardia vs. Fundus. Pulsatile ooze with small crevice in the mucosa.Treated with 2 clips and EPI x 8 cc. Bleeding stopped. 2) No other abnormalities. 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 male with a history of CKD stage III, MGUS, and HTN who came to the hospital complaining of hematemesis.EGD yesterday with actively bleeding lesion in proximal  stomach--near or in cardia, treated with 2 clips and epi ? Dieaulafoys lesion?Continue PPI. Pt was on liquids and reg diet ordered earlier today. Follow Hgb--pt will n ot take blood Raymond Gurney witness).     LOS: 3 days   Hvozdovic, Vita Barley PA-C 07/25/2014, Pager [phone]  [facility] Attending  I have also seen and assessed the patient and agree with the advanced practitioner's assessment and plan. Not bleeding - continue current Tx - he is improved overall He describes sxs of orthostasis - not a surprise ? If he needs more volume - will defer to hospitalist - ? If mineralocorticoid tx sensible - again defer to hospitalist Will be on a long road to improving Hgb and will likely be anemic with CKD  Gatha Mayer, MD, [facility] [phone] (pager) 07/25/2014 1:30 PM

## 2014-07-25 NOTE — Progress Notes (Signed)
CRITICAL VALUE ALERT  Critical value received:  Hgb  Date of notification:  07/25/14  Time of notification:  0635  Critical value read back:Yes.    Nurse who received alert:  RG  MD notified (1st page):  [facility]  Time of first page:  [phone] MD notified (2nd page):  Time of second page:  Responding MD:    Time MD responded:  [phone]

## 2014-07-25 NOTE — Progress Notes (Signed)
[place] TEAM 1 - Stepdown/ICU TEAM Progress Note  Roger Wood POL:[id number] DOB: 1941-03-31 DOA: 07/22/2014 PCP: Gearlean Alf., PA-C  Admit HPI / Brief Narrative: 73 y.o. male with a history of CKD stage III, MGUS, and HTN who came to the hospital complaining of hematemesis. Patient follows with Dr. 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  He gets erythropoietin and iron infusion intermittently.  He had dark stools for 1 week and then developed dizziness and early fatigability.  He vomited coffee-ground emesis twice on the day of his admission so he came into the ED.  In the ED the patient was found to have a hemoglobin of 4.8, w/ last hemoglobin 11 on 06/23/2014. 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    HPI/Subjective: Pt states his nausea has resolved. He denies cp, vomiting, or HA.  He asks about being able to take a DMV physical due before the end of the month.    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 patient on aspirin w/ no other NSAIDs use - cont protonix now via gtt - tx w/ 2 clips and epi injection during EGD 4/1  Acute blood loss anemia on chronic anemia  Hemoglobin baseline 11 about 3 weeks prior to admit - hemoglobin 4.8 at admission - patient is a Jehovah's Witness and does not accept blood transfusion - minimize blood draws w/ only QOD CBC - 1025 mg of InFeD, Aranesp 200 g given at time of admission - Hgb appears to have stabilized around 4.0 - orthostatic sx now the persisting issue - will attempt to ameliorate w/ IVF, but pt does not appear to be volume depleted, despite his low colloid state (is actually hypertensive) - do not feel mineralocorticoids would be of benefit in setting of baseline HTN  Intractable Nausea ?related to Fe load in bowel, with no recent BM - resolved and tolerating regular diet   CKD stage 4 baseline crt appears to be 2.5-2.9  - crt stable   MGUS   DM2 (pt previously denied) A1c 9.3 in Dec 2015 and 8.9 in 2014 therefore pt DOES HAVE DM - I have explained this to him and he acknowledges this information - SSI - w/ diet tolerance will consider adding oral tx and follow (not good candidate for glucotrol or metformin due to kidney disease) - consult diabetes coordinator to assist w/ eduction   Prostate CA S/p XRT  Hypertension - uncontrolled BP remains elevated, despite severe anemia - follow w/o change today   HLD  Hold med tx until oral intake consistent   Code Status: FULL Family Communication: spoke w/ wife at bedside  Disposition Plan: SDU  Consultants: [facility]   Procedures: EGD - 4/1 - results noted above    Antibiotics: none  DVT prophylaxis: SCDs  Objective: Blood pressure 156/49, pulse 72, temperature 98.1 F (36.7 C), temperature source Oral, resp. rate 16, height 5\' 11"  (1.803 m), weight 114.9 kg (253 lb 4.9 oz), SpO2 99 %.  Intake/Output Summary (Last 24 hours) at 07/25/14 1429 Last data filed at 07/25/14 0700  Gross per 24 hour  Intake    600 ml  Output   1050 ml  Net   -450 ml   Exam: General: No acute respiratory distress Lungs: Clear to auscultation bilaterally without wheezes or crackles Cardiovascular: Regular rate and rhythm without murmur gallop rub  Abdomen: Nontender, nondistended, soft, bowel sounds positive,  no rebound, no ascites, no appreciable mass Extremities: No significant cyanosis, clubbing, or edema bilateral lower extremities  Data Reviewed: Basic Metabolic Panel:  Recent Labs Lab 07/22/14 1426 07/24/14 0253 07/25/14 0530  NA 134* 137 137  K 4.9 4.8 4.6  CL 104 111 110  CO2 18* 21 20  GLUCOSE 302* 194* 161*  BUN 109* 87* 56*  CREATININE 2.97* 3.11* 3.05*  CALCIUM 8.3* 8.0* 7.9*    Liver Function Tests:  Recent Labs Lab 07/22/14 1426 07/25/14 0530  AST 10 11  ALT 14 11  ALKPHOS 53 49  BILITOT 0.4 0.5  PROT 5.1* 4.9*  ALBUMIN 2.6*  2.4*   Coags:  Recent Labs Lab 07/22/14 1426 07/23/14 0007  INR 1.11 1.13   CBC:  Recent Labs Lab 07/22/14 1426 07/23/14 0350 07/24/14 0253 07/25/14 0530  WBC 13.4* 10.9* 13.7* 10.5  HGB 4.8* 4.0* 4.0* 3.7*  HCT 14.8* 12.6* 12.7* 11.8*  MCV 77.5* 78.8 81.9 84.3  PLT 304 245 266 260    CBG:  Recent Labs Lab 07/24/14 1225 07/24/14 1632 07/24/14 1943 07/25/14 0747 07/25/14 1125  GLUCAP 199* 199* 198* 147* 191*    Studies:  Recent x-ray studies have been reviewed in detail by the Attending Physician  Scheduled Meds:  Scheduled Meds: . amLODipine  10 mg Oral q morning - 10a  . hydrALAZINE  50 mg Oral BID  . insulin aspart  0-9 Units Subcutaneous TID WC  . metoprolol succinate  100 mg Oral Daily  . [START ON 07/27/2014] pantoprazole (PROTONIX) IV  40 mg Intravenous Q12H  . sodium chloride  3 mL Intravenous Q12H    Time spent on care of this patient: 35 mins   Fayne Mcguffee T , MD   [facility] Office  [phone] Pager - Text Page per Shea Evans as per below:  On-Call/Text Page:      [email or web address]      password [email or web address]  If 7PM-7AM, please contact night-coverage [email or web address] Password [email or web address] 07/25/2014, 2:29 PM   LOS: 3 days

## 2014-07-26 ENCOUNTER — Encounter (HOSPITAL_COMMUNITY): Payer: Self-pay | Admitting: Internal Medicine

## 2014-07-26 DIAGNOSIS — K250 Acute gastric ulcer with hemorrhage: Secondary | ICD-10-CM

## 2014-07-26 LAB — CBC
HCT: 12.4 % — ABNORMAL LOW (ref 39.0–52.0)
Hemoglobin: 3.7 g/dL — CL (ref 13.0–17.0)
MCH: 26.1 pg (ref 26.0–34.0)
MCHC: 29.8 g/dL — AB (ref 30.0–36.0)
MCV: 87.3 fL (ref 78.0–100.0)
Platelets: 271 10*3/uL (ref 150–400)
RBC: 1.42 MIL/uL — ABNORMAL LOW (ref 4.22–5.81)
RDW: 25.1 % — ABNORMAL HIGH (ref 11.5–15.5)
WBC: 12.3 10*3/uL — ABNORMAL HIGH (ref 4.0–10.5)

## 2014-07-26 LAB — GLUCOSE, CAPILLARY
GLUCOSE-CAPILLARY: 131 mg/dL — AB (ref 70–99)
Glucose-Capillary: 165 mg/dL — ABNORMAL HIGH (ref 70–99)
Glucose-Capillary: 249 mg/dL — ABNORMAL HIGH (ref 70–99)
Glucose-Capillary: 174 mg/dL — ABNORMAL HIGH (ref 70–99)
Glucose-Capillary: 204 mg/dL — ABNORMAL HIGH (ref 70–99)

## 2014-07-26 MED ORDER — PANTOPRAZOLE SODIUM 40 MG PO TBEC
40.0000 mg | DELAYED_RELEASE_TABLET | Freq: Two times a day (BID) | ORAL | Status: DC
  Administered 2014-07-27 – 2014-08-06 (×20): 40 mg via ORAL
  Filled 2014-07-26 (×22): qty 1

## 2014-07-26 MED ORDER — LIVING WELL WITH DIABETES BOOK
Freq: Once | Status: AC
  Administered 2014-07-26: 14:00:00
  Filled 2014-07-26: qty 1

## 2014-07-26 MED ORDER — AMLODIPINE BESYLATE 5 MG PO TABS
5.0000 mg | ORAL_TABLET | Freq: Every morning | ORAL | Status: DC
  Administered 2014-07-27 – 2014-08-06 (×11): 5 mg via ORAL
  Filled 2014-07-26 (×11): qty 1

## 2014-07-26 MED ORDER — HYDRALAZINE HCL 25 MG PO TABS
25.0000 mg | ORAL_TABLET | Freq: Two times a day (BID) | ORAL | Status: DC
  Administered 2014-07-26 – 2014-08-06 (×22): 25 mg via ORAL
  Filled 2014-07-26 (×24): qty 1

## 2014-07-26 NOTE — Progress Notes (Signed)
Daily Rounding Note  07/26/2014, 8:14 AM  LOS: 4 days   SUBJECTIVE:       No BMs yesterday.  Denies SOB or chest pain. Tolerating carb mod diet.   OBJECTIVE:         Vital signs in last 24 hours:    Temp:  [98.1 F (36.7 C)-99 F (37.2 C)] 98.8 F (37.1 C) (04/04 0745) Pulse Rate:  [72-88] 86 (04/04 0745) Resp:  [16-25] 17 (04/04 0745) BP: (95-156)/(39-86) 155/58 mmHg (04/04 0745) SpO2:  [93 %-100 %] 100 % (04/04 0745) Last BM Date: 07/21/14 Filed Weights   07/22/14 1420 07/22/14 1913 07/25/14 0358  Weight: 255 lb (115.667 kg) 240 lb 8.4 oz (109.1 kg) 253 lb 4.9 oz (114.9 kg)   General: drowsy. Comfortable.     Heart: RRR Chest: clear bil.   Abdomen: soft, obese,  NT, active BS.  Extremities: no CCE Neuro/Psych:  Drowsy but arouseable.  Appropriate.   Intake/Output from previous day: 04/03 0701 - 04/04 0700 In: -  Out: 925 [Urine:925]  Intake/Output this shift:    Lab Results:  Recent Labs  07/24/14 0253 07/25/14 0530 07/26/14 0353  WBC 13.7* 10.5 12.3*  HGB 4.0* 3.7* 3.7*  HCT 12.7* 11.8* 12.4*  PLT 266 260 271   BMET  Recent Labs  07/24/14 0253 07/25/14 0530  NA 137 137  K 4.8 4.6  CL 111 110  CO2 21 20  GLUCOSE 194* 161*  BUN 87* 56*  CREATININE 3.11* 3.05*  CALCIUM 8.0* 7.9*   LFT  Recent Labs  07/25/14 0530  PROT 4.9*  ALBUMIN 2.4*  AST 11  ALT 11  ALKPHOS 49  BILITOT 0.5   Scheduled Meds: . amLODipine  10 mg Oral q morning - 10a  . hydrALAZINE  50 mg Oral BID  . insulin aspart  0-9 Units Subcutaneous TID WC  . living well with diabetes book   Does not apply Once  . metoprolol succinate  100 mg Oral Daily  . [START ON 07/27/2014] pantoprazole (PROTONIX) IV  40 mg Intravenous Q12H  . pioglitazone  15 mg Oral Daily  . sodium chloride  3 mL Intravenous Q12H   Continuous Infusions: . sodium chloride 100 mL/hr (07/25/14 2243)  . pantoprozole (PROTONIX) infusion 8 mg/hr  (07/26/14 0434)   PRN Meds:.acetaminophen **OR** acetaminophen, acetaminophen-codeine, ondansetron, oxyCODONE   ASSESMENT:   *  Cg emesis, dark FOBT+ stool.  EGD  4/1: actively bleeding, proximal gastric lesion (cardia vs fundus, Dieulafoy's?).  Clipped x 2, epi injection.    *  ABL anemia, background of chronic anemia/anemia of CKD.   Though in last 6 weeks had not required his every 3 week epogen as blood counts were relatively good.  Iron infusions given several months ago.  Pt is JH so blood transfusion not available as therapy.  Hgb down ~ 1 gram from admission level which was down 10.2 grams from previous reading 30 days prior.   *  CKD.  Now stage 4.  On NS at 100/hr   *  IDDM.   PLAN   *  Might be best if we check his labs every 2 to 3 days.  *  Protonix drip finishes tomorrow AM and slated to then begin IV BID (can probably go to po at that point so I changed the orders).    Azucena Freed  07/26/2014, 8:14 AM Pager: [phone] Attending MD note:   I have taken a  history, examined the patient, and reviewed the chart. I agree with the Advanced Practitioner's impression and recommendations. Modest drop in H/H is likely due to equilibration. Clinically stable. May be able to advance diet tomorrow. IV and oral Iron long term.  [facility] Pager # [phone]

## 2014-07-26 NOTE — Progress Notes (Signed)
[place] TEAM 1 - Stepdown/ICU TEAM Progress Note  Roger Wood ASN:[id number] DOB: 06-20-1940 DOA: 07/22/2014 PCP: Gearlean Alf., PA-C  Admit HPI / Brief Narrative: 74 y.o. male with a history of CKD stage III, MGUS, and HTN who came to the hospital complaining of hematemesis. Patient follows with Dr. 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  He gets erythropoietin and iron infusion intermittently.  He had dark stools for 1 week and then developed dizziness and early fatigability.  He vomited coffee-ground emesis twice on the day of his admission so he came into the ED.  In the ED the patient was found to have a hemoglobin of 4.8, w/ last hemoglobin 11 on 06/23/2014. 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    HPI/Subjective: Pt continues to have orthostatic sx, even just when sitting up in bed.  Denies cp, sob, n/v, or abdom pain.  He reports a good appetite.    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 patient was on aspirin w/ no other NSAIDs use - cont protonix gtt to complete 72hrs - tx w/ 2 clips and epi injection during EGD 4/1  Acute blood loss anemia on chronic anemia  Hemoglobin baseline 11 about 3 weeks prior to admit - hemoglobin 4.8 at admission - patient is a Jehovah's Witness and does not accept blood transfusion - minimize blood draws w/ only QOD CBC - 1025 mg of InFeD, Aranesp 200 g given at time of admission - Hgb appears to have stabilized around 4.0 - orthostatic sx now the persisting issue - will attempt to ameliorate w/ IVF, but pt does not appear to be volume depleted, despite his low colloid state (is actually hypertensive) - recheck Fe studies in AM to determine if repeat Fe dosing of benefit   Intractable Nausea ?related to Fe load in bowel, with no recent BM - resolved and tolerating regular diet   CKD stage 4 baseline crt appears to be 2.5-2.9 - crt  stable   MGUS   DM2 (pt previously denied) A1c 9.3 in Dec 2015 and 8.9 in 2014 therefore pt DOES HAVE DM - I have explained this to him and he acknowledges this information - SSI - w/ diet tolerance added oral tx (not good candidate for glucotrol or metformin due to kidney disease) - consult diabetes coordinator to assist w/ eduction   Prostate CA S/p XRT  Hypertension - uncontrolled BP stable - follow w/o change today   HLD  Hold med tx until oral intake consistent   Code Status: FULL Family Communication: spoke w/ family at bedside  Disposition Plan: stable for transfer to med bed   Consultants: [facility]   Procedures: EGD - 4/1 - results noted above    Antibiotics: none  DVT prophylaxis: SCDs  Objective: Blood pressure 114/50, pulse 70, temperature 98.1 F (36.7 C), temperature source Oral, resp. rate 18, height 5\' 11"  (1.803 m), weight 114.9 kg (253 lb 4.9 oz), SpO2 97 %.  Intake/Output Summary (Last 24 hours) at 07/26/14 1449 Last data filed at 07/26/14 0730  Gross per 24 hour  Intake      0 ml  Output   1325 ml  Net  -1325 ml   Exam: General: No acute respiratory distress Lungs: Clear to auscultation bilaterally without wheezes or crackles Cardiovascular: Regular rate and rhythm without murmur gallop or rub  Abdomen: Nontender, nondistended, soft, bowel sounds positive,  no rebound, no ascites, no appreciable mass Extremities: No significant cyanosis, clubbing, edema bilateral lower extremities  Data Reviewed: Basic Metabolic Panel:  Recent Labs Lab 07/22/14 1426 07/24/14 0253 07/25/14 0530  NA 134* 137 137  K 4.9 4.8 4.6  CL 104 111 110  CO2 18* 21 20  GLUCOSE 302* 194* 161*  BUN 109* 87* 56*  CREATININE 2.97* 3.11* 3.05*  CALCIUM 8.3* 8.0* 7.9*    Liver Function Tests:  Recent Labs Lab 07/22/14 1426 07/25/14 0530  AST 10 11  ALT 14 11  ALKPHOS 53 49  BILITOT 0.4 0.5  PROT 5.1* 4.9*  ALBUMIN 2.6* 2.4*   Coags:  Recent Labs Lab  07/22/14 1426 07/23/14 0007  INR 1.11 1.13   CBC:  Recent Labs Lab 07/22/14 1426 07/23/14 0350 07/24/14 0253 07/25/14 0530 07/26/14 0353  WBC 13.4* 10.9* 13.7* 10.5 12.3*  HGB 4.8* 4.0* 4.0* 3.7* 3.7*  HCT 14.8* 12.6* 12.7* 11.8* 12.4*  MCV 77.5* 78.8 81.9 84.3 87.3  PLT 304 245 266 260 271    CBG:  Recent Labs Lab 07/25/14 1125 07/25/14 1723 07/25/14 2300 07/26/14 0747 07/26/14 1232  GLUCAP 191* 187* 199* 165* 249*    Studies:  Recent x-ray studies have been reviewed in detail by the Attending Physician  Scheduled Meds:  Scheduled Meds: . amLODipine  10 mg Oral q morning - 10a  . hydrALAZINE  50 mg Oral BID  . insulin aspart  0-9 Units Subcutaneous TID WC  . living well with diabetes book   Does not apply Once  . metoprolol succinate  100 mg Oral Daily  . [START ON 07/27/2014] pantoprazole  40 mg Oral BID  . pioglitazone  15 mg Oral Daily  . sodium chloride  3 mL Intravenous Q12H    Time spent on care of this patient: 25 mins   [facility] T , MD   [facility] Office  [phone] Pager - Text Page per Shea Evans as per below:  On-Call/Text Page:      [email or web address]      password [email or web address]  If 7PM-7AM, please contact night-coverage [email or web address] Password [email or web address] 07/26/2014, 2:49 PM   LOS: 4 days

## 2014-07-26 NOTE — Progress Notes (Signed)
NURSING PROGRESS NOTE  AMEDEO DETWEILER [id number] Transfer Data: 07/26/2014 6:33 PM Attending Provider: Cherene Altes, MD QIW:LNLGX,QJJHER C., PA-C Code Status: Full  ROMAN DUBUC is a 74 y.o. male patient transferred from 2c  -No acute distress noted.  -No complaints of shortness of breath.  -No complaints of chest pain.    Blood pressure 151/58, pulse 73, temperature 98.3 F (36.8 C), temperature source Oral, resp. rate 18, height 5\' 11"  (1.803 m), weight 114.9 kg (253 lb 4.9 oz), SpO2 97 %.   IV Fluids:  Right arm, running NS at 138ml/hour.  Allergies:  Review of patient's allergies indicates no known allergies.  Past Medical History:   has a past medical history of Hypertension; Gout; Arthritis; History of radiation therapy (01/27/11-03/26/11); Refusal of blood transfusions as patient is Jehovah's Witness; Cataract; Anemia; Prostate cancer (07/14/10); Diabetes mellitus; and Type II diabetes mellitus with nephropathy.  Past Surgical History:   has past surgical history that includes Esophagogastroduodenoscopy (N/A, 07/23/2014).  Social History:   reports that he quit smoking about 47 years ago. His smoking use included Cigarettes. He has never used smokeless tobacco. He reports that he does not drink alcohol or use illicit drugs.  Skin: Intact  Patient/Family orientated to room. Information packet given to patient/family. Admission inpatient armband information verified with patient/family to include name and date of birth and placed on patient arm. Side rails up x 2, fall assessment and education completed with patient/family. Patient/family able to verbalize understanding of risk associated with falls and verbalized understanding to call for assistance before getting out of bed. Call light within reach. Patient/family able to voice and demonstrate understanding of unit orientation instructions.    Will continue to evaluate and treat per MD orders.

## 2014-07-26 NOTE — Progress Notes (Signed)
Spoke with patient about his diabetes. Stated that he was started on insulin about 3 years ago and quit taking it when he was later told that he did not have DM. HgbA1C is 9.2%. However, patient's Hgb is 3.7 and this may cause a false reading in the A1C. Ordered Living Well with Diabetes booklet. Asked staff RNs to have patient watch DM videos. Patient has blood glucose meter and strips and a PCP in [place]. Recommended that he see his PCP after discharge for followup.  Will have dietician to see patient to give him meal planning instruction. Will continue to follow while in hospital. Harvel Ricks RN BSN CDE

## 2014-07-27 LAB — CBC
HCT: 13.8 % — ABNORMAL LOW (ref 39.0–52.0)
Hemoglobin: 4.2 g/dL — CL (ref 13.0–17.0)
MCH: 26.6 pg (ref 26.0–34.0)
MCHC: 30.4 g/dL (ref 30.0–36.0)
MCV: 87.3 fL (ref 78.0–100.0)
PLATELETS: 267 10*3/uL (ref 150–400)
RBC: 1.58 MIL/uL — ABNORMAL LOW (ref 4.22–5.81)
RDW: 26.7 % — ABNORMAL HIGH (ref 11.5–15.5)
WBC: 12.2 10*3/uL — AB (ref 4.0–10.5)

## 2014-07-27 LAB — IRON AND TIBC
Iron: 68 ug/dL (ref 42–165)
Saturation Ratios: 26 % (ref 20–55)
TIBC: 265 ug/dL (ref 215–435)
UIBC: 197 ug/dL (ref 125–400)

## 2014-07-27 LAB — FERRITIN: FERRITIN: 920 ng/mL — AB (ref 22–322)

## 2014-07-27 LAB — GLUCOSE, CAPILLARY
GLUCOSE-CAPILLARY: 140 mg/dL — AB (ref 70–99)
GLUCOSE-CAPILLARY: 186 mg/dL — AB (ref 70–99)
GLUCOSE-CAPILLARY: 170 mg/dL — AB (ref 70–99)
Glucose-Capillary: 188 mg/dL — ABNORMAL HIGH (ref 70–99)

## 2014-07-27 LAB — VITAMIN B12: Vitamin B-12: 357 pg/mL (ref 211–911)

## 2014-07-27 LAB — RETICULOCYTES
RBC.: 1.58 MIL/uL — AB (ref 4.22–5.81)
RETIC COUNT ABSOLUTE: 229.1 10*3/uL — AB (ref 19.0–186.0)
RETIC CT PCT: 14.5 % — AB (ref 0.4–3.1)

## 2014-07-27 LAB — FOLATE: FOLATE: 13.4 ng/mL

## 2014-07-27 NOTE — Plan of Care (Signed)
Problem: Food- and Nutrition-Related Knowledge Deficit (NB-1.1) Goal: Nutrition education Formal process to instruct or train a patient/client in a skill or to impart knowledge to help patients/clients voluntarily manage or modify food choices and eating behavior to maintain or improve health. Outcome: Adequate for Discharge  RD consulted for nutrition education regarding diabetes.     Lab Results  Component Value Date    HGBA1C 9.2* 07/23/2014    RD provided "Carbohydrate Counting for People with Diabetes" handout from the Academy of [employer] and Dietetics. Discussed different food groups and their effects on blood sugar, emphasizing carbohydrate-containing foods. Provided list of carbohydrates and recommended serving sizes of common foods.  Discussed importance of controlled and consistent carbohydrate intake throughout the day. Provided examples of ways to balance meals/snacks and encouraged intake of high-fiber, whole grain complex carbohydrates. Teach back method used.  Expect fair compliance.  Body mass index is 35.35 kg/(m^2). Pt meets criteria for obesity, class II based on current BMI.  Current diet order is carb modified, patient is consuming approximately 100% of meals at this time. Labs and medications reviewed. No further nutrition interventions warranted at this time. RD contact information provided. If additional nutrition issues arise, please re-consult RD.  Roger Wood A. Jimmye Norman, RD, LDN, CDE Pager: [phone] After hours Pager: [phone]

## 2014-07-27 NOTE — Plan of Care (Signed)
Problem: Phase I Progression Outcomes Goal: Initial discharge plan identified Outcome: Completed/Met Date Met:  07/27/14 To return home with wife

## 2014-07-27 NOTE — Progress Notes (Signed)
Roger Wood IWL:[id number] DOB: March 18, 1941 DOA: 07/22/2014 PCP: Gearlean Alf., PA-C   74 y.o. ? h/o CKD stage III, MGUS c anemia of chronic disease -foll Dr. Lenna Sciara  .  He gets erythropoietin and iron infusion intermittently.   He had dark stools for 1 week and then developed dizziness and early fatigability.  He vomited coffee-ground emesis twice on the day of his admission so he came into the ED. In the ED the patient was found to have a hemoglobin of 4.8, w/ last hemoglobin 11 on 06/23/2014.   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 patient on aspirin w/ no other NSAIDs use  - cont protonix 40 by mouth twice a day which was started on 4/5 - tx w/ 2 clips and epi injection during EGD 4/1   Acute blood loss anemia on chronic anemia  Hemoglobin baseline 11 about 3 weeks prior to admit  - hemoglobin 4.8 at admission  - patient is a Sales promotion account executive Witness and does not accept blood transfusion  - minimize blood draws w/ only QOD CBC [Will use pediatrics tube]  - 1025 mg of InFeD, Aranesp 200 g given at time of admission  - Hgb appears to have stabilized around 4.0  - orthostatic sx now the persisting issue   will attempt to ameliorate w/ IVF--[changed IV saline to 50 cc per hour on 4/5], but pt does not appear to be volume depleted, despite his low colloid state (is actually hypertensive)  - do not feel mineralocorticoids would be of benefit in setting of baseline HTN - will recheck iron panel and if necessary will give IV iron in the next 1-2 days -  Intractable Nausea ?related to Fe load in bowel, with no recent  BM  - resolved and tolerating regular diet   CKD stage 4 baseline crt appears to be 2.5-2.9 - crt stable   MGUS -Need outpatient management with Dr. Earlie Server -I will place him on the treatment team   DM2 (pt previously denied) A1c 9.3 in Dec 2015 and 8.9 in 2014 therefore pt DOES HAVE DM - I have explained this to him and he acknowledges this information - SSI - w/ diet tolerance will consider adding oral tx and follow (not good candidate for glucotrol or metformin due to kidney disease) - consult diabetes coordinator to assist w/ eduction   Prostate CA S/p XRT  Hypertension - uncontrolled BP remains elevated, despite severe anemia - follow w/o change today   HLD  Hold med tx until oral intake consistent   Code Status: FULL Family Communication: spoke w/ wife at bedside  Disposition Plan: SDU  Consultants: [facility]   Procedures: EGD - 4/1 - results noted above    Antibiotics: none  DVT prophylaxis: SCDs  Objective: Blood pressure 165/61, pulse 74, temperature 98.5 F (36.9 C), temperature source Oral, resp. rate 18, height 5\' 11"  (1.803 m), weight 114.9 kg (253 lb 4.9 oz), SpO2 100 %.  Intake/Output Summary (Last 24 hours) at 07/27/14 1336 Last data filed at 07/27/14 1000  Gross per 24 hour  Intake    360 ml  Output   2450  ml  Net  -2090 ml   Exam: General: Somewhat short of breath appearing , gets dizzy on sitting up  Lungs: Clear to auscultation bilaterally without wheezes or crackles Cardiovascular: Regular rate and rhythm without murmur gallop rub  Abdomen: Nontender, nondistended, soft, bowel sounds positive, no rebound, no ascites, no s  Data Reviewed: Basic Metabolic Panel:  Recent Labs Lab 07/22/14 1426 07/24/14 0253 07/25/14 0530  NA 134* 137 137  K 4.9 4.8 4.6  CL 104 111 110  CO2 18* 21 20  GLUCOSE 302* 194* 161*  BUN 109* 87* 56*  CREATININE 2.97* 3.11* 3.05*  CALCIUM 8.3* 8.0* 7.9*    Liver Function Tests:  Recent Labs Lab  07/22/14 1426 07/25/14 0530  AST 10 11  ALT 14 11  ALKPHOS 53 49  BILITOT 0.4 0.5  PROT 5.1* 4.9*  ALBUMIN 2.6* 2.4*   Coags:  Recent Labs Lab 07/22/14 1426 07/23/14 0007  INR 1.11 1.13   CBC:  Recent Labs Lab 07/23/14 0350 07/24/14 0253 07/25/14 0530 07/26/14 0353 07/27/14 0828  WBC 10.9* 13.7* 10.5 12.3* 12.2*  HGB 4.0* 4.0* 3.7* 3.7* 4.2*  HCT 12.6* 12.7* 11.8* 12.4* 13.8*  MCV 78.8 81.9 84.3 87.3 87.3  PLT 245 266 260 271 267    CBG:  Recent Labs Lab 07/26/14 1611 07/26/14 1815 07/26/14 2230 07/27/14 0829 07/27/14 1151  GLUCAP 174* 131* 204* 140* 186*    Scheduled Meds:  Scheduled Meds: . amLODipine  5 mg Oral q morning - 10a  . hydrALAZINE  25 mg Oral BID  . insulin aspart  0-9 Units Subcutaneous TID WC  . metoprolol succinate  100 mg Oral Daily  . pantoprazole  40 mg Oral BID  . pioglitazone  15 mg Oral Daily  . sodium chloride  3 mL Intravenous Q12H    Time spent on care of this patient: 35 mins  Verneita Griffes, MD [facility] [phone]

## 2014-07-27 NOTE — Evaluation (Signed)
Physical Therapy Evaluation Patient Details Name: Roger Wood MRN: [id number] DOB: [birth year] Today's Date: 07/27/2014   History of Present Illness  74 y.o. Roger with a history of CKD stage III, MGUS, and HTN who came to the hospital complaining of hematemesis. Patient follows with Dr. 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 He gets erythropoietin and iron infusion intermittently. He had dark stools for 1 week and then developed dizziness and early fatigability. He vomited coffee-ground emesis twice on the day of his admission so he came into the ED.  Underwent EGD with clipping of ulcerations.  Hgb now stable around 4  Clinical Impression  Pt admitted with/for Hematomesis and ABLA.  Pt currently limited functionally due to the problems listed below.  (see problems list.)  Pt will benefit from PT to maximize function and safety to be able to get home safely with available assist of family.     Follow Up Recommendations Home health PT;Supervision/Assistance - 24 hour;Other (comment) (unless slow to "bounce back" then ST SNF)    Equipment Recommendations  None recommended by PT    Recommendations for Other Services       Precautions / Restrictions Precautions Precautions: Fall Precaution Comments: critically low Hgb Restrictions Weight Bearing Restrictions: No      Mobility  Bed Mobility Overal bed mobility: Needs Assistance Bed Mobility: Supine to Sit;Sit to Supine     Supine to sit: Supervision Sit to supine: Supervision   General bed mobility comments: generally easy transition, but dyspneic afterward.  Transfers Overall transfer level: Needs assistance   Transfers: Sit to/from Stand Sit to Stand: Min guard         General transfer comment: mildly unsteady transition to stand.  Ambulation/Gait Ambulation/Gait assistance: Min assist Ambulation Distance (Feet): 6 Feet (forward and back with RW.) Assistive device:  Rolling walker (2 wheeled) Gait Pattern/deviations: Step-through pattern;Staggering left;Staggering right     General Gait Details: mildly staggering gait, appearing effortful with flexed posture and dyspnea.  Pt asked to return to bed quickly upon walking away from the bed.  Stairs            Wheelchair Mobility    Modified Rankin (Stroke Patients Only)       Balance Overall balance assessment: Needs assistance Sitting-balance support: No upper extremity supported Sitting balance-Leahy Scale: Fair     Standing balance support: Bilateral upper extremity supported Standing balance-Leahy Scale: Poor Standing balance comment: More effortful standing even with the RW                             Pertinent Vitals/Pain Pain Assessment: No/denies pain    Home Living Family/patient expects to be discharged to:: Private residence Living Arrangements: Spouse/significant other Available Help at Discharge: Family;Available 24 hours/day Type of Home: House Home Access: Stairs to enter;Level entry (level on the back) Entrance Stairs-Rails: Right Entrance Stairs-Number of Steps: 2 Home Layout: One level Home Equipment: Crutches;Walker - 2 wheels;Bedside commode;Wheelchair - manual      Prior Function Level of Independence: Independent with assistive device(s)         Comments: Furniture walker for household distances and uses crutches for community ambulation. No falls reported. Cares for wife- Mod I for ADls and IADLs - cooks, cleans and drives.      Hand Dominance   Dominant Hand: Right    Extremity/Trunk Assessment   Upper Extremity Assessment: Overall [facility]  for tasks assessed           Lower Extremity Assessment: Generalized weakness;Overall WFL for tasks assessed         Communication   Communication: No difficulties  Cognition Arousal/Alertness: Awake/alert Behavior During Therapy: WFL for tasks assessed/performed Overall Cognitive Status:  Within Functional Limits for tasks assessed                      General Comments General comments (skin integrity, edema, etc.): Discussed conservative progression until his Hgb numbers rise.  Discuss what to expect.  sats maintained 92-95% with HR rising to 96 bpm with little activity.    Exercises        Assessment/Plan    PT Assessment Patient needs continued PT services  PT Diagnosis Difficulty walking;Generalized weakness   PT Problem List Decreased strength;Decreased activity tolerance;Decreased balance;Decreased mobility;Decreased coordination;Decreased knowledge of use of DME;Cardiopulmonary status limiting activity  PT Treatment Interventions DME instruction;Gait training;Stair training;Functional mobility training;Therapeutic activities;Balance training;Patient/family education   PT Goals (Current goals can be found in the Care Plan section) Acute Rehab PT Goals Patient Stated Goal: Get back home PT Goal Formulation: With patient Time For Goal Achievement: 08/03/14 Potential to Achieve Goals: Good    Frequency Min 3X/week   Barriers to discharge        Co-evaluation               End of Session   Activity Tolerance: Patient tolerated treatment well;Patient limited by fatigue Patient left: in bed;with call bell/phone within reach;with family/visitor present Nurse Communication: Mobility status         Time: 1218-1239 PT Time Calculation (min) (ACUTE ONLY): 21 min   Charges:   PT Evaluation $Initial PT Evaluation Tier I: 1 Procedure     PT G Codes:        Jeremiah Tarpley, Tessie Fass 07/27/2014, 1:14 PM 07/27/2014  Donnella Sham, PT [phone]  (pager)

## 2014-07-27 NOTE — Progress Notes (Signed)
Medicare Important Message given?  YES Date Medicare IM given:  07/27/2014 Medicare IM given by:  Lyliana Dicenso  

## 2014-07-28 ENCOUNTER — Inpatient Hospital Stay (HOSPITAL_COMMUNITY): Payer: Medicare Other

## 2014-07-28 DIAGNOSIS — Z531 Procedure and treatment not carried out because of patient's decision for reasons of belief and group pressure: Secondary | ICD-10-CM

## 2014-07-28 DIAGNOSIS — J9601 Acute respiratory failure with hypoxia: Secondary | ICD-10-CM | POA: Diagnosis present

## 2014-07-28 DIAGNOSIS — N179 Acute kidney failure, unspecified: Secondary | ICD-10-CM | POA: Diagnosis present

## 2014-07-28 DIAGNOSIS — E1121 Type 2 diabetes mellitus with diabetic nephropathy: Secondary | ICD-10-CM

## 2014-07-28 DIAGNOSIS — I5032 Chronic diastolic (congestive) heart failure: Secondary | ICD-10-CM | POA: Diagnosis present

## 2014-07-28 DIAGNOSIS — N183 Chronic kidney disease, stage 3 (moderate): Secondary | ICD-10-CM

## 2014-07-28 DIAGNOSIS — E669 Obesity, unspecified: Secondary | ICD-10-CM | POA: Diagnosis present

## 2014-07-28 LAB — CBC WITH DIFFERENTIAL/PLATELET
BASOS ABS: 0 10*3/uL (ref 0.0–0.1)
Basophils Relative: 0 % (ref 0–1)
EOS ABS: 0.2 10*3/uL (ref 0.0–0.7)
Eosinophils Relative: 1 % (ref 0–5)
HEMATOCRIT: 13.6 % — AB (ref 39.0–52.0)
HEMOGLOBIN: 4.1 g/dL — AB (ref 13.0–17.0)
LYMPHS PCT: 8 % — AB (ref 12–46)
Lymphs Abs: 1.2 10*3/uL (ref 0.7–4.0)
MCH: 26.3 pg (ref 26.0–34.0)
MCHC: 30.1 g/dL (ref 30.0–36.0)
MCV: 87.2 fL (ref 78.0–100.0)
Monocytes Absolute: 0.9 10*3/uL (ref 0.1–1.0)
Monocytes Relative: 6 % (ref 3–12)
Neutro Abs: 12.8 10*3/uL — ABNORMAL HIGH (ref 1.7–7.7)
Neutrophils Relative %: 85 % — ABNORMAL HIGH (ref 43–77)
PLATELETS: 308 10*3/uL (ref 150–400)
RBC: 1.56 MIL/uL — ABNORMAL LOW (ref 4.22–5.81)
RDW: 26.5 % — AB (ref 11.5–15.5)
WBC: 15.1 10*3/uL — ABNORMAL HIGH (ref 4.0–10.5)

## 2014-07-28 LAB — FERRITIN: Ferritin: 963 ng/mL — ABNORMAL HIGH (ref 22–322)

## 2014-07-28 LAB — GLUCOSE, CAPILLARY
GLUCOSE-CAPILLARY: 159 mg/dL — AB (ref 70–99)
Glucose-Capillary: 134 mg/dL — ABNORMAL HIGH (ref 70–99)
Glucose-Capillary: 157 mg/dL — ABNORMAL HIGH (ref 70–99)
Glucose-Capillary: 203 mg/dL — ABNORMAL HIGH (ref 70–99)

## 2014-07-28 LAB — BRAIN NATRIURETIC PEPTIDE: B Natriuretic Peptide: 248.6 pg/mL — ABNORMAL HIGH (ref 0.0–100.0)

## 2014-07-28 LAB — IRON AND TIBC
IRON: 44 ug/dL (ref 42–165)
Saturation Ratios: 18 % — ABNORMAL LOW (ref 20–55)
TIBC: 241 ug/dL (ref 215–435)
UIBC: 197 ug/dL (ref 125–400)

## 2014-07-28 LAB — RETICULOCYTES
RBC.: 1.56 MIL/uL — ABNORMAL LOW (ref 4.22–5.81)
RETIC COUNT ABSOLUTE: 212.2 10*3/uL — AB (ref 19.0–186.0)
Retic Ct Pct: 13.6 % — ABNORMAL HIGH (ref 0.4–3.1)

## 2014-07-28 MED ORDER — DOCUSATE SODIUM 100 MG PO CAPS
100.0000 mg | ORAL_CAPSULE | Freq: Two times a day (BID) | ORAL | Status: DC | PRN
  Administered 2014-07-29 – 2014-08-02 (×5): 100 mg via ORAL
  Filled 2014-07-28 (×7): qty 1

## 2014-07-28 MED ORDER — FUROSEMIDE 10 MG/ML IJ SOLN
20.0000 mg | INTRAMUSCULAR | Status: AC
  Administered 2014-07-28: 20 mg via INTRAVENOUS
  Filled 2014-07-28: qty 2

## 2014-07-28 NOTE — Consult Note (Signed)
[place]

## 2014-07-28 NOTE — Progress Notes (Signed)
PROGRESS NOTE  Roger Wood:[id number] DOB: 18-Dec-1940 DOA: 07/22/2014 PCP: Gearlean Alf., PA-C  HPI/Recap of past 73 hours: 74 year old male with past medical history of anemia chronic disease from MGUS plus stage III chronic kidney disease as well as Jehovah's Witness admitted on 3/31 for 1 week of dark stools and coffee-ground emesis on day of admission. Seen by gastroenterology and underwent EGD findingDieulafoy lesons, with 2 areas that were clipped and injection of epinephrine.  Since that time, hemoglobin as low as 3.7.  Today hemoglobin at 4.1. Patient noted to be more hypoxic requiring increase in oxygen. Concerns for volume overload patient given 1 dose Lasix. BNP checked and found only be at 248. White blood cell count worsening 51. No fever. Patient suffered no complaints, tired  Assessment/Plan: Principal Problem:   Acute respiratory failure with hypoxia secondary to Acute blood loss anemia secondary to upper GI bleed with complications of patient being Jehovah's Witness and refusing blood transfusion: Continue IV iron. Supplemental oxygen Active Problems:    Type II diabetes mellitus with nephropathy: CBGs staying well controlled, under 200    Acute kidney injury in the setting of CKD (chronic kidney disease), stage III: Continue to follow labs. Likely decreased perfusion secondary to blood loss Chronic diastolic heart failure: BMP within normal limits.  Code Status: Full code  Family Communication: Wife and other family members present  Disposition Plan: Hopefully living will stabilize and patient will be discharged eventually   Consultants:  gastroenterology  Procedures:  EGD done 4/1:Dieulafoy lesons--clipped x 2 + Epi 8 cc  Antibiotics:  None   Objective: BP 134/53 mmHg  Pulse 74  Temp(Src) 98.4 F (36.9 C) (Oral)  Resp 22  Ht 5\' 11"  (1.803 m)  Wt 114.9 kg (253 lb 4.9 oz)  BMI 35.35 kg/m2  SpO2 100%  Intake/Output Summary (Last 24 hours) at  07/28/14 1826 Last data filed at 07/28/14 1800  Gross per 24 hour  Intake   1210 ml  Output   2135 ml  Net   -925 ml   Filed Weights   07/22/14 1420 07/22/14 1913 07/25/14 0358  Weight: 115.667 kg (255 lb) 109.1 kg (240 lb 8.4 oz) 114.9 kg (253 lb 4.9 oz)    Exam:   General:  Somnolent  Cardiovascular: Regular rate and rhythm, G4-W1, 2/6 systolic ejection murmur  Respiratory: Clear to auscultation bilaterally  Abdomen: Soft, obese, nontender, positive bowel sounds  Musculoskeletal: No clubbing or cyanosis, trace edema   Data Reviewed: Basic Metabolic Panel:  Recent Labs Lab 07/22/14 1426 07/24/14 0253 07/25/14 0530  NA 134* 137 137  K 4.9 4.8 4.6  CL 104 111 110  CO2 18* 21 20  GLUCOSE 302* 194* 161*  BUN 109* 87* 56*  CREATININE 2.97* 3.11* 3.05*  CALCIUM 8.3* 8.0* 7.9*   Liver Function Tests:  Recent Labs Lab 07/22/14 1426 07/25/14 0530  AST 10 11  ALT 14 11  ALKPHOS 53 49  BILITOT 0.4 0.5  PROT 5.1* 4.9*  ALBUMIN 2.6* 2.4*   No results for input(s): LIPASE, AMYLASE in the last 168 hours. No results for input(s): AMMONIA in the last 168 hours. CBC:  Recent Labs Lab 07/24/14 0253 07/25/14 0530 07/26/14 0353 07/27/14 0828 07/28/14 0556  WBC 13.7* 10.5 12.3* 12.2* 15.1*  NEUTROABS  --   --   --   --  12.8*  HGB 4.0* 3.7* 3.7* 4.2* 4.1*  HCT 12.7* 11.8* 12.4* 13.8* 13.6*  MCV 81.9 84.3 87.3 87.3 87.2  PLT  266 260 271 267 308   Cardiac Enzymes:   No results for input(s): CKTOTAL, CKMB, CKMBINDEX, TROPONINI in the last 168 hours. BNP (last 3 results)  Recent Labs  07/28/14 1404  BNP 248.6*    ProBNP (last 3 results)  Recent Labs  04/09/14 1212  PROBNP 537.6*    CBG:  Recent Labs Lab 07/27/14 1701 07/27/14 2146 07/28/14 0744 07/28/14 1234 07/28/14 1718  GLUCAP 188* 170* 134* 159* 157*    No results found for this or any previous visit (from the past 240 hour(s)).   Studies: No results found.  Scheduled Meds: .  amLODipine  5 mg Oral q morning - 10a  . hydrALAZINE  25 mg Oral BID  . insulin aspart  0-9 Units Subcutaneous TID WC  . metoprolol succinate  100 mg Oral Daily  . pantoprazole  40 mg Oral BID  . pioglitazone  15 mg Oral Daily  . sodium chloride  3 mL Intravenous Q12H    Continuous Infusions:    Time spent:   [facility] Pager [phone]. If 7PM-7AM, please contact night-coverage at [email or web address], password [facility] 07/28/2014, 6:26 PM  LOS: 6 days

## 2014-07-28 NOTE — Progress Notes (Signed)
O2 sats was 86% on 2 l this am, increase O2 to 5l before coming up to 93/94 %, family feels he is also a little swollen. Today, lungs was clear.  Text paged Dr. [place] Pink and informed of above.  Will continue to monitor and await new orders or return call.  Alphonzo Lemmings, RN

## 2014-07-29 ENCOUNTER — Encounter (HOSPITAL_COMMUNITY): Payer: Self-pay | Admitting: General Practice

## 2014-07-29 DIAGNOSIS — I5032 Chronic diastolic (congestive) heart failure: Secondary | ICD-10-CM

## 2014-07-29 DIAGNOSIS — J9601 Acute respiratory failure with hypoxia: Secondary | ICD-10-CM

## 2014-07-29 DIAGNOSIS — Q279 Congenital malformation of peripheral vascular system, unspecified: Secondary | ICD-10-CM

## 2014-07-29 LAB — BASIC METABOLIC PANEL
Anion gap: 8 (ref 5–15)
BUN: 36 mg/dL — ABNORMAL HIGH (ref 6–23)
CHLORIDE: 111 mmol/L (ref 96–112)
CO2: 18 mmol/L — ABNORMAL LOW (ref 19–32)
Calcium: 8.1 mg/dL — ABNORMAL LOW (ref 8.4–10.5)
Creatinine, Ser: 3.03 mg/dL — ABNORMAL HIGH (ref 0.50–1.35)
GFR calc Af Amer: 22 mL/min — ABNORMAL LOW (ref >90)
GFR calc non Af Amer: 19 mL/min — ABNORMAL LOW (ref >90)
Glucose, Bld: 142 mg/dL — ABNORMAL HIGH (ref 70–99)
Potassium: 4.9 mmol/L (ref 3.5–5.1)
Sodium: 137 mmol/L (ref 135–145)

## 2014-07-29 LAB — GLUCOSE, CAPILLARY
GLUCOSE-CAPILLARY: 136 mg/dL — AB (ref 70–99)
Glucose-Capillary: 157 mg/dL — ABNORMAL HIGH (ref 70–99)
Glucose-Capillary: 195 mg/dL — ABNORMAL HIGH (ref 70–99)
Glucose-Capillary: 131 mg/dL — ABNORMAL HIGH (ref 70–99)

## 2014-07-29 LAB — CBC
HCT: 12.9 % — ABNORMAL LOW (ref 39.0–52.0)
HEMOGLOBIN: 3.9 g/dL — AB (ref 13.0–17.0)
MCH: 26.4 pg (ref 26.0–34.0)
MCHC: 30.2 g/dL (ref 30.0–36.0)
MCV: 87.2 fL (ref 78.0–100.0)
PLATELETS: 321 10*3/uL (ref 150–400)
RBC: 1.48 MIL/uL — ABNORMAL LOW (ref 4.22–5.81)
RDW: 25.9 % — AB (ref 11.5–15.5)
WBC: 14.2 10*3/uL — ABNORMAL HIGH (ref 4.0–10.5)

## 2014-07-29 MED ORDER — DOCUSATE SODIUM 100 MG PO CAPS: 100.0000 mg | ORAL_CAPSULE | Freq: Two times a day (BID) | ORAL | Status: AC | PRN

## 2014-07-29 MED ORDER — PIOGLITAZONE HCL 15 MG PO TABS: 15.0000 mg | ORAL_TABLET | Freq: Every day | ORAL | Status: DC

## 2014-07-29 MED ORDER — OMEPRAZOLE 20 MG PO CPDR: 20.0000 mg | DELAYED_RELEASE_CAPSULE | Freq: Two times a day (BID) | ORAL | Status: DC

## 2014-07-29 NOTE — Care Management Note (Addendum)
    Page 1 of 1   08/05/2014     11:50:41 AM CARE MANAGEMENT NOTE 08/05/2014  Patient:  FALON, FLINCHUM   Account Number:  [id number]  Date Initiated:  08/02/2014  Documentation initiated by:  Carles Collet  Subjective/Objective Assessment:   Gi Bleed, sever anemia JW- refused blood products, O2 requirement. from home will dc to SNF     Action/Plan:   will follow for any DC needs   Anticipated DC Date:  08/06/2014   Anticipated DC Plan:  SKILLED NURSING FACILITY  In-house referral  Clinical Social Worker      DC Planning Services  CM consult      Choice offered to / List presented to:             Status of service:  Completed, signed off Medicare Important Message given?  YES (If response is "NO", the following Medicare IM given date fields will be blank) Date Medicare IM given:  07/29/2014 Medicare IM given by:  Carles Collet Date Additional Medicare IM given:  08/02/2014 Additional Medicare IM given by:  [facility] SWIST  Discharge Disposition:  [place]  Per UR Regulation:  Reviewed for med. necessity/level of care/duration of stay  If discussed at [place] of Stay Meetings, dates discussed:   07/29/2014    Comments:  08/05/14 Tomi Bamberger RN,BSN [phone] IM given on 07/05/14 .  08-02-14 im letter given. DSwist RN BSN CM

## 2014-07-29 NOTE — Progress Notes (Signed)
Physical Therapy Treatment Patient Details Name: Roger Wood MRN: [id number] DOB: [birth year] Today's Date: 07/29/2014    History of Present Illness 74 y.o. male with a history of CKD stage III, MGUS, and HTN who came to the hospital complaining of hematemesis. Patient follows with Dr. 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 He gets erythropoietin and iron infusion intermittently. He had dark stools for 1 week and then developed dizziness and early fatigability. He vomited coffee-ground emesis twice on the day of his admission so he came into the ED.  Underwent EGD with clipping of ulcerations.  Hgb now stable around 4    PT Comments    Progressing steadily.  Oxygenation improving slowly.  Fatigues very quickly.   Follow Up Recommendations  Home health PT;Supervision/Assistance - 24 hour;Other (comment)     Equipment Recommendations  None recommended by PT    Recommendations for Other Services       Precautions / Restrictions Precautions Precautions: Fall Precaution Comments: critically low Hgb Restrictions Weight Bearing Restrictions: No    Mobility  Bed Mobility Overal bed mobility: Needs Assistance Bed Mobility: Supine to Sit     Supine to sit: Supervision     General bed mobility comments: easier transition to EOB with momentum  Transfers Overall transfer level: Needs assistance Equipment used: Rolling walker (2 wheeled) Transfers: Sit to/from Stand Sit to Stand: Min assist;Min guard;Max assist         General transfer comment: cues for safest technique  Ambulation/Gait Ambulation/Gait assistance: Min guard;Min assist (min as he fatigues) Ambulation Distance (Feet): 30 Feet (x2 with RW) Assistive device: Rolling walker (2 wheeled) Gait Pattern/deviations: Step-through pattern Gait velocity: slow and guarded   General Gait Details: gait more steady until fatigue then mild staggering happens   Stairs             Wheelchair Mobility    Modified Rankin (Stroke Patients Only)       Balance Overall balance assessment: Needs assistance Sitting-balance support: No upper extremity supported Sitting balance-Leahy Scale: Fair       Standing balance-Leahy Scale: Fair                      Cognition Arousal/Alertness: Awake/alert Behavior During Therapy: WFL for tasks assessed/performed Overall Cognitive Status: Within Functional Limits for tasks assessed                      Exercises      General Comments General comments (skin integrity, edema, etc.): SpO2 when ambulating on 4L  96% and 92bpm, and rest down to 3L at 96-98% and 91 bpm..  2nd trial ambulation 3l  at 91% and 91 bpm and at rest 98% and bpm.      Pertinent Vitals/Pain Pain Assessment: No/denies pain    Home Living                      Prior Function            PT Goals (current goals can now be found in the care plan section) Acute Rehab PT Goals Patient Stated Goal: Get back home PT Goal Formulation: With patient Time For Goal Achievement: 08/03/14 Potential to Achieve Goals: Good Progress towards PT goals: Progressing toward goals    Frequency  Min 3X/week    PT Plan Current plan remains appropriate    Co-evaluation  End of Session Equipment Utilized During Treatment: Oxygen Activity Tolerance: Patient tolerated treatment well;Patient limited by fatigue Patient left: in bed;with call bell/phone within reach;with family/visitor present     Time: 5465-6812 PT Time Calculation (min) (ACUTE ONLY): 20 min  Charges:  $Gait Training: 8-22 mins                    G Codes:      Tiffny Gemmer, Tessie Fass 07/29/2014, 11:59 AM  07/29/2014  Donnella Sham, [place] [phone]  (pager)

## 2014-07-29 NOTE — Consult Note (Signed)
Roger Wood

## 2014-07-29 NOTE — Discharge Summary (Addendum)
Discharge Summary  Roger Wood [id number] DOB: 01-31-1941  PCP: Gearlean Alf., PA-C  Admit date: 07/22/2014 Anticipated Discharge date: 07/30/14  Time spent: 25 minutes  Recommendations for Outpatient Follow-up:  1. Patient will be discharged to SNF  2. Recommend reassessing hgb levels within the next 1 week 3.  New medication: Prilosec 20 mg by mouth twice a day 4. Medication change: Patient will discontinue aspirin 5. Medication change: Patient's beta blocker increased to 100 mg by mouth daily  Discharge Diagnoses:  Active Hospital Problems   Diagnosis Date Noted  . Dieulafoy ulcer   . Hemorrhoids, internal, with bleeding 08/02/2014  . Rectal bleeding 08/02/2014  . Obesity (BMI 30-39.9) 07/28/2014  . Acute kidney injury 07/28/2014  . Chronic diastolic heart failure 82/99/3716  . Acute respiratory failure with hypoxia 07/28/2014  . Upper GI bleed 07/22/2014  . Acute blood loss anemia 07/22/2014  . CKD (chronic kidney disease), stage III 07/22/2014  . Refusal of blood transfusions as patient is Jehovah's Witness 12/20/2010  . Type II diabetes mellitus with nephropathy 12/20/2010    Resolved Hospital Problems   Diagnosis Date Noted Date Resolved  No resolved problems to display.    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  Hospital Course:  Principal Problem:  Acute respiratory failure with hypoxia secondary to Acute blood loss anemia secondary to upper GI bleed with complications of patient being Jehovah's  Witness and refusing blood transfusion: Received IV Epogen 1 dose. Received IV iron. Seen by gastroenterology and underwent EGD findingDieulafoy lesons, with 2 areas that were clipped and injection of epinephrine,  since that time., Hemoglobin has been staying around 4.0. His lowest 3.7. Patient has confirmed that he does not want to receive blood products. He does understand he is at higher risk for death. At this time his blood pressure has been stable along with heart rate.  Active Problems: Fever of Unknown origin: - Work up negative, blood cultures x 2 report no growth to date, covered with Rocephin. Fevers subsided and wbc trended down as such given no source of infection but improvement on 3 rd generation cephalosporin will d/c on omnicef for 6 more days to complete a total antibiotic coverage of 7 days.    Type II diabetes mellitus with nephropathy: CBGs staying well controlled, under 200. Patient to continue home medication regimen on d/c    Obesity (BMI 30-39.9): Patient is criteria with BMI greater than 30    Chronic diastolic heart failure: Will have patient resume his home lasix regimen. Continue B blocker  Essential hypertension: Continue B blocker and amlodipine on d/c   Consultants:  gastroenterology  Procedures:  EGD done 4/1:Dieulafoy lesons--clipped x 2 + Epi 8 cc  Discharge Exam: BP 141/85 mmHg  Pulse 81  Temp(Src) 98.9 F (37.2 C) (Oral)  Resp 20  Ht 5\' 11"  (1.803 m)  Wt 115.259 kg (254 lb 1.6 oz)  BMI 35.46 kg/m2  SpO2 100%  General: Alert & Oriented x 3, NAD Cardiovascular: RRR S1, S2 II/VI SEM Respiratory:  CTA Bilaterally  Discharge Instructions You were cared for by a hospitalist during your hospital stay. If you have any questions about your discharge medications or the care you received while you were in the hospital after you are discharged, you can call the unit and asked to speak with the hospitalist on call if the hospitalist that took care of  you is not available. Once you are discharged, your primary care physician will handle any further medical issues. Please note that NO REFILLS for any discharge medications will be authorized once you are discharged, as it is imperative that you return to your primary care physician (or establish a relationship with a primary care physician if you do not have one) for your aftercare needs so that they can reassess your need for medications and monitor your lab values.      Discharge Instructions    Call MD for:  difficulty breathing, headache or visual disturbances    Complete by:  As directed      Call MD for:  difficulty breathing, headache or visual disturbances    Complete by:  As directed      Call MD for:  extreme fatigue    Complete by:  As directed      Call MD for:  temperature >100.4    Complete by:  As directed      Call MD for:  temperature >100.4    Complete by:  As directed      Diet - low sodium heart healthy    Complete by:  As directed      Diet - low sodium heart healthy    Complete by:  As directed      Discharge instructions    Complete by:  As directed   Repeat BMP and CBC within the next week. Patient to follow-up with primary care physician within the next week for evaluation of hemoglobin levels.     Discharge instructions    Complete by:  As directed   Reassess cbc levels within the next 1 week.     Increase activity slowly    Complete by:  As directed      Increase activity slowly    Complete by:  As directed             Medication List    STOP taking these medications        aspirin 325 MG tablet     potassium chloride 10 MEQ tablet  Commonly known as:  K-DUR,KLOR-CON      TAKE these medications        allopurinol 100 MG tablet  Commonly known as:  ZYLOPRIM  Take 100 mg by mouth.     amLODipine 10 MG tablet  Commonly known as:  NORVASC  Take 10 mg by mouth every morning.     cefdinir 300 MG capsule  Commonly known as:  OMNICEF  Take 1  capsule (300 mg total) by mouth 2 (two) times daily.     docusate sodium 100 MG capsule  Commonly known as:  COLACE  Take 1 capsule (100 mg total) by mouth 2 (two) times daily as needed for mild constipation.     ferrous sulfate 325 (65 FE) MG tablet  Take 1 tablet (325 mg total) by mouth 3 (three) times daily with meals.     furosemide 40 MG tablet  Commonly known as:  LASIX  Take 40 mg by mouth daily as needed (For leg swelling.).  hydrALAZINE 25 MG tablet  Commonly known as:  APRESOLINE  Take 1 tablet (25 mg total) by mouth 2 (two) times daily.     hydrocortisone 2.5 % rectal cream  Commonly known as:  ANUSOL-HC  Place rectally 3 (three) times daily.     metoprolol succinate 50 MG 24 hr tablet  Commonly known as:  TOPROL-XL  Take 50 mg by mouth daily.     omeprazole 20 MG capsule  Commonly known as:  PRILOSEC  Take 1 capsule (20 mg total) by mouth 2 (two) times daily before a meal.     pioglitazone 15 MG tablet  Commonly known as:  ACTOS  Take 1 tablet (15 mg total) by mouth daily.     PRAVACHOL 40 MG tablet  Generic drug:  pravastatin  Take 40 mg by mouth.       No Known Allergies    The results of significant diagnostics from this hospitalization (including imaging, microbiology, ancillary and laboratory) are listed below for reference.    Significant Diagnostic Studies: Dg Chest 2 View  07/28/2014   CLINICAL DATA:  Shortness of breath.  EXAM: CHEST  2 VIEW  COMPARISON:  04/11/2014  FINDINGS: Lungs are hypoinflated with mild prominence of the perihilar markings. Linear density over the left upper lobe likely atelectasis and less likely infection. Posterior left basilar opacification likely layering effusion and associated atelectasis, although cannot exclude infection. Mild stable cardiomegaly. Remainder the exam is unchanged.  IMPRESSION: Findings likely representing mild CHF with left-sided effusion and left upper lobe and basilar atelectasis. Cannot exclude  infection in the left base.   Electronically Signed   By: Marin Olp M.D.   On: 07/28/2014 10:30   Dg Chest Port 1 View  08/04/2014   CLINICAL DATA:  Fever of unknown origin for 2 days.  EXAM: PORTABLE CHEST - 1 VIEW  COMPARISON:  07/28/2014  FINDINGS: The heart is enlarged but stable. There is central vascular congestion and probable interstitial edema. Possible small left effusion and left basilar atelectasis. Stable linear band of left upper lobe atelectasis. The bony thorax is grossly intact.  IMPRESSION: Persistent or recurrent CHF with probable left effusion and left lower lobe atelectasis.  Stable discoid atelectasis in the left upper lobe   Electronically Signed   By: Marijo Sanes M.D.   On: 08/04/2014 18:26    Microbiology: Recent Results (from the past 240 hour(s))  Culture, blood (routine x 2)     Status: None (Preliminary result)   Collection Time: 08/03/14 11:21 PM  Result Value Ref Range Status   Specimen Description BLOOD RIGHT ANTECUBITAL  Final   Special Requests BOTTLES DRAWN AEROBIC ONLY 10CC  Final   Culture   Final           BLOOD CULTURE RECEIVED NO GROWTH TO DATE CULTURE WILL BE HELD FOR 5 DAYS BEFORE ISSUING A FINAL NEGATIVE REPORT Performed at [facility]    Report Status PENDING  Incomplete  Culture, blood (routine x 2)     Status: None (Preliminary result)   Collection Time: 08/03/14 11:25 PM  Result Value Ref Range Status   Specimen Description BLOOD RIGHT HAND  Final   Special Requests BOTTLES DRAWN AEROBIC ONLY 5CC  Final   Culture   Final           BLOOD CULTURE RECEIVED NO GROWTH TO DATE CULTURE WILL BE HELD FOR 5 DAYS BEFORE ISSUING A FINAL NEGATIVE REPORT Performed at [facility]  Report Status PENDING  Incomplete  Culture, Urine     Status: None   Collection Time: 08/04/14 11:41 PM  Result Value Ref Range Status   Specimen Description URINE, CATHETERIZED  Final   Special Requests NONE  Final   Colony Count NO  GROWTH Performed at [facility]   Final   Culture NO GROWTH Performed at [facility]   Final   Report Status 08/06/2014 FINAL  Final     Labs: Basic Metabolic Panel:  Recent Labs Lab 08/05/14 1355 08/06/14 0530  NA 129* 131*  K 6.1* 5.1  CL 101 100  CO2 16* 16*  GLUCOSE 222* 125*  BUN 59* 62*  CREATININE 4.61* 4.85*  CALCIUM 8.1* 8.3*   Liver Function Tests: No results for input(s): AST, ALT, ALKPHOS, BILITOT, PROT, ALBUMIN in the last 168 hours. No results for input(s): LIPASE, AMYLASE in the last 168 hours. No results for input(s): AMMONIA in the last 168 hours. CBC:  Recent Labs Lab 08/02/14 0553 08/05/14 0640 08/06/14 0530  WBC 9.8 12.8* 11.0*  HGB 4.4* 4.6* 4.6*  HCT 14.9* 14.9* 14.2*  MCV 86.1 83.7 82.1  PLT 326 290 314   Cardiac Enzymes: No results for input(s): CKTOTAL, CKMB, CKMBINDEX, TROPONINI in the last 168 hours. BNP: BNP (last 3 results)  Recent Labs  07/28/14 1404  BNP 248.6*    ProBNP (last 3 results)  Recent Labs  04/09/14 1212  PROBNP 537.6*    CBG:  Recent Labs Lab 08/05/14 0806 08/05/14 1201 08/05/14 1650 08/05/14 2120 08/06/14 0813  GLUCAP 146* 244* 146* 180* 140*       Signed:  Reighn Kaplan  [facility] 08/06/2014, 11:12 AM

## 2014-07-29 NOTE — Progress Notes (Signed)
Medicare Important Message given?  YES (If response is "NO", the following Medicare IM given date fields will be blank) Date Medicare IM given:  07/29/14 Medicare IM given by:  Tomi Bamberger

## 2014-07-30 LAB — GLUCOSE, CAPILLARY
GLUCOSE-CAPILLARY: 173 mg/dL — AB (ref 70–99)
Glucose-Capillary: 129 mg/dL — ABNORMAL HIGH (ref 70–99)
Glucose-Capillary: 188 mg/dL — ABNORMAL HIGH (ref 70–99)
Glucose-Capillary: 133 mg/dL — ABNORMAL HIGH (ref 70–99)

## 2014-07-30 MED ORDER — FERROUS SULFATE 325 (65 FE) MG PO TABS
325.0000 mg | ORAL_TABLET | Freq: Three times a day (TID) | ORAL | Status: DC
  Administered 2014-07-30 – 2014-08-06 (×21): 325 mg via ORAL
  Filled 2014-07-30 (×24): qty 1

## 2014-07-30 NOTE — Progress Notes (Signed)
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 Seen by gastroenterology and underwent EGD finding Dieulafoy lesons, with 2 areas that were clipped and injection of epinephrine.  Since that time, hemoglobin as low as 3.7.  Today hemoglobin at down from yesterdays value  Assessment/Plan: Principal Problem:   Acute respiratory failure with hypoxia secondary to Acute blood loss anemia secondary to upper GI bleed with complications of patient being Jehovah's Witness and refusing blood transfusion: Continue supplemental Iron replacement. Supplemental oxygen - pt s/p aranesp injection initially in ED  Active Problems:     Type II diabetes mellitus with nephropathy: CBGs staying well controlled, under 200    Acute kidney injury in the setting of CKD (chronic kidney disease), stage III: Given low hemoglobin levels will place blood. On last check 3.0.  Likely decreased perfusion secondary to blood loss.  Chronic diastolic heart failure: BMP within normal limits.  Code Status: Full code  Family Communication: Wife and other family members present  Disposition Plan: Pending improvement in condition.   Consultants:  gastroenterology  Procedures:  EGD done 4/1:Dieulafoy lesons--clipped x 2 + Epi 8 cc  Antibiotics:  None   Objective: BP 151/49 mmHg  Pulse 75  Temp(Src) 98.3 F (36.8 C) (Oral)  Resp 18  Ht 5\' 11"  (1.803 m)  Wt 111.449 kg (245 lb 11.2 oz)  BMI 34.28 kg/m2  SpO2 96%  Intake/Output Summary (Last 24 hours) at 07/30/14 1832 Last data filed at 07/30/14 1234  Gross per 24 hour  Intake    480 ml  Output   1800 ml  Net  -1320 ml   Filed Weights   07/25/14 0358 07/29/14 0616 07/30/14 0741  Weight: 114.9 kg (253 lb 4.9 oz) 112.129 kg (247 lb 3.2 oz) 111.449 kg (245 lb 11.2 oz)    Exam:   General:  Somnolent  Cardiovascular: Regular rate and rhythm, K5-L9, 2/6 systolic ejection murmur  Respiratory: Clear to auscultation bilaterally, no wheezes  Abdomen: Soft, obese, nontender, positive bowel sounds  Musculoskeletal: No clubbing or cyanosis, trace edema   Data Reviewed: Basic Metabolic Panel:  Recent Labs Lab 07/24/14 0253 07/25/14 0530 07/29/14 0530  NA 137 137 137  K 4.8 4.6 4.9  CL 111 110 111  CO2 21 20 18*  GLUCOSE 194* 161* 142*  BUN 87* 56* 36*  CREATININE 3.11* 3.05* 3.03*  CALCIUM 8.0* 7.9* 8.1*   Liver Function Tests:  Recent Labs Lab 07/25/14 0530  AST 11  ALT 11  ALKPHOS 49  BILITOT 0.5  PROT 4.9*  ALBUMIN 2.4*   No results for input(s): LIPASE, AMYLASE in the last 168 hours. No results for input(s): AMMONIA in the last 168 hours. CBC:  Recent Labs Lab 07/25/14 0530 07/26/14 0353 07/27/14 0828 07/28/14 0556 07/29/14 0530  WBC 10.5 12.3* 12.2* 15.1* 14.2*  NEUTROABS  --   --   --  12.8*  --   HGB 3.7* 3.7* 4.2* 4.1* 3.9*  HCT 11.8* 12.4* 13.8* 13.6* 12.9*  MCV 84.3 87.3 87.3 87.2 87.2  PLT 260 271 267 308 321   Cardiac Enzymes:   No results for input(s): CKTOTAL, CKMB, CKMBINDEX, TROPONINI in the last 168 hours. BNP (last 3  results)  Recent Labs  07/28/14 1404  BNP 248.6*    ProBNP (last 3 results)  Recent Labs  04/09/14 1212  PROBNP 537.6*    CBG:  Recent Labs Lab 07/29/14 1641 07/29/14 2142 07/30/14 0803 07/30/14 1230 07/30/14 1647  GLUCAP 195* 131* 129* 173* 188*    No results found for this or any previous visit (from the past 240 hour(s)).   Studies: No results found.  Scheduled Meds: . amLODipine  5 mg Oral q morning - 10a  . ferrous sulfate  325 mg Oral TID WC  . hydrALAZINE  25 mg Oral BID  . insulin aspart  0-9 Units Subcutaneous TID WC  .  metoprolol succinate  100 mg Oral Daily  . pantoprazole  40 mg Oral BID  . pioglitazone  15 mg Oral Daily  . sodium chloride  3 mL Intravenous Q12H    Continuous Infusions:    Time spent: > 25 minutes  Velvet Bathe  [facility] Pager [phone]. If 7PM-7AM, please contact night-coverage at [email or web address], password [facility] 07/30/2014, 6:32 PM  LOS: 8 days

## 2014-07-30 NOTE — Clinical Social Work Note (Signed)
Disposition recommendation changed today. CSW unable to see patient today. Report will have to be left for weekend CSW.   Liz Beach MSW, [place], [place], [phone]

## 2014-07-30 NOTE — Progress Notes (Signed)
Physical Therapy Treatment Patient Details Name: Roger Wood MRN: [id number] DOB: 1941/03/11 Today's Date: 07/30/2014    History of Present Illness 74 yo male with HGB 4.8 admitted for GI bleed issues, treated for  chronic anemia and is not transfusing (Jehovah's Witness)    PT Comments    Pt was seen for follow up to low hgb and noted his balance and endurance continue to be an issue.  He won't be safe to go home now with his continual balance and endurance losses, and agrees with family that SNF will be practical for short rehab stay.  Follow Up Recommendations  Supervision/Assistance - 24 hour;SNF     Equipment Recommendations  None recommended by PT    Recommendations for Other Services       Precautions / Restrictions Precautions Precautions: Fall Precaution Comments: critically low Hgb Restrictions Weight Bearing Restrictions: No    Mobility  Bed Mobility Overal bed mobility: Modified Independent                Transfers Overall transfer level: Needs assistance Equipment used: Rolling walker (2 wheeled) Transfers: Sit to/from [facility] Sit to Stand: Min guard;Min assist Stand pivot transfers: Min guard;Min assist       General transfer comment: cues for safest technique  Ambulation/Gait Ambulation/Gait assistance: Min assist Ambulation Distance (Feet): 60 Feet Assistive device: Rolling walker (2 wheeled) Gait Pattern/deviations: Step-through pattern;Decreased dorsiflexion - right;Decreased dorsiflexion - left;Wide base of support;Trunk flexed Gait velocity: slow and guarded Gait velocity interpretation: Below normal speed for age/gender General Gait Details: SOB from the beginning and with any real effort such as with MMTing   Stairs            Wheelchair Mobility    Modified Rankin (Stroke Patients Only)       Balance Overall balance assessment: Needs assistance Sitting-balance support: Feet supported Sitting  balance-Leahy Scale: Fair   Postural control: Posterior lean Standing balance support: Bilateral upper extremity supported Standing balance-Leahy Scale: Fair Standing balance comment: fair- dynamic balance                    Cognition Arousal/Alertness: Awake/alert Behavior During Therapy: WFL for tasks assessed/performed Overall Cognitive Status: Within Functional Limits for tasks assessed                      Exercises      General Comments General comments (skin integrity, edema, etc.): Pt is continually SOB with all activity and unsafe with mobility and unable to be alone.  Given his ongoing medical issues, will be better served with hgb decline to go to SNF      Pertinent Vitals/Pain Pain Assessment: No/denies pain    Home Living                      Prior Function            PT Goals (current goals can now be found in the care plan section) Acute Rehab PT Goals Patient Stated Goal: Get back home Progress towards PT goals: Progressing toward goals    Frequency  Min 3X/week    PT Plan Discharge plan needs to be updated    Co-evaluation             End of Session Equipment Utilized During Treatment: Oxygen Activity Tolerance: Patient tolerated treatment well;Patient limited by lethargy;Patient limited by fatigue Patient left: in bed;with call bell/phone within reach;with family/visitor present  Time: 0102-7253 PT Time Calculation (min) (ACUTE ONLY): 26 min  Charges:  $Gait Training: 8-22 mins $Therapeutic Exercise: 8-22 mins                    G Codes:      Ramond Dial 28-Aug-2014, 1:05 PM   Mee Hives, PT MS Acute Rehab Dept. Number: [phone]

## 2014-07-31 LAB — GLUCOSE, CAPILLARY
GLUCOSE-CAPILLARY: 165 mg/dL — AB (ref 70–99)
Glucose-Capillary: 146 mg/dL — ABNORMAL HIGH (ref 70–99)
Glucose-Capillary: 128 mg/dL — ABNORMAL HIGH (ref 70–99)
Glucose-Capillary: 168 mg/dL — ABNORMAL HIGH (ref 70–99)

## 2014-07-31 NOTE — Progress Notes (Signed)
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 Seen by gastroenterology and underwent EGD finding Dieulafoy lesons, with 2 areas that were clipped and injection of epinephrine.  Since that time, hemoglobin as low as 3.7.  Assessment/Plan: Principal Problem:   Acute respiratory failure with hypoxia secondary to Acute blood loss anemia secondary to upper GI bleed with complications of patient being Jehovah's Witness and refusing blood transfusion: Continue supplemental Iron replacement. Supplemental oxygen - pt s/p aranesp injection initially in ED, patient currently feels better.  Active Problems:     Type II diabetes mellitus with nephropathy: CBGs staying well controlled, under 200    Acute kidney injury in the setting of CKD (chronic kidney disease), stage III: Given low hemoglobin levels will place blood. On last check 3.0.  Likely decreased perfusion secondary to blood loss.  Chronic diastolic heart failure: BMP within normal limits.  Code Status: Full code  Family Communication: Wife and other family members present  Disposition Plan: Pending improvement in condition.   Consultants:  gastroenterology  Procedures:  EGD done 4/1:Dieulafoy lesons--clipped x 2 + Epi 8 cc  Antibiotics:  None   Objective: BP 157/55 mmHg  Pulse 68  Temp(Src) 98 F (36.7 C) (Axillary)  Resp 20  Ht 5\' 11"  (1.803 m)  Wt 110 kg (242 lb 8.1 oz)  BMI 33.84 kg/m2  SpO2 92%  Intake/Output Summary (Last 24 hours) at 07/31/14 1832 Last data filed at 07/31/14 1015  Gross per 24 hour  Intake     50 ml  Output   1030 ml  Net   -980 ml   Filed Weights   07/29/14 0616  07/30/14 0741 07/31/14 0500  Weight: 112.129 kg (247 lb 3.2 oz) 111.449 kg (245 lb 11.2 oz) 110 kg (242 lb 8.1 oz)    Exam:   General:  Alert and awake, sitting up at bedside  Cardiovascular: Regular rate and rhythm, M7-E7, 2/6 systolic ejection murmur  Respiratory: Clear to auscultation bilaterally, no wheezes  Abdomen: Soft, obese, nontender, positive bowel sounds  Musculoskeletal: No clubbing or cyanosis, trace edema   Data Reviewed: Basic Metabolic Panel:  Recent Labs Lab 07/25/14 0530 07/29/14 0530  NA 137 137  K 4.6 4.9  CL 110 111  CO2 20 18*  GLUCOSE 161* 142*  BUN 56* 36*  CREATININE 3.05* 3.03*  CALCIUM 7.9* 8.1*   Liver Function Tests:  Recent Labs Lab 07/25/14 0530  AST 11  ALT 11  ALKPHOS 49  BILITOT 0.5  PROT 4.9*  ALBUMIN 2.4*   No results for input(s): LIPASE, AMYLASE in the last 168 hours. No results for input(s): AMMONIA in the last 168 hours. CBC:  Recent Labs Lab 07/25/14 0530 07/26/14 0353 07/27/14 0828 07/28/14 0556 07/29/14 0530  WBC 10.5 12.3* 12.2* 15.1* 14.2*  NEUTROABS  --   --   --  12.8*  --   HGB 3.7* 3.7* 4.2* 4.1* 3.9*  HCT 11.8* 12.4* 13.8* 13.6* 12.9*  MCV 84.3 87.3 87.3 87.2 87.2  PLT 260 271 267 308 321   Cardiac Enzymes:   No results for input(s): CKTOTAL, CKMB, CKMBINDEX, TROPONINI in the last 168 hours. BNP (last 3 results)  Recent Labs  07/28/14 1404  BNP 248.6*    ProBNP (last 3 results)  Recent Labs  04/09/14 1212  PROBNP 537.6*    CBG:  Recent Labs Lab 07/30/14 1647 07/30/14 2150 07/31/14 0756 07/31/14 1227 07/31/14 1711  GLUCAP 188* 133* 146* 165* 128*    No results found for this or any previous visit (from the past 240 hour(s)).   Studies: No results found.  Scheduled Meds: . amLODipine  5 mg Oral q morning - 10a  . ferrous sulfate  325 mg Oral TID WC  . hydrALAZINE  25 mg Oral BID  . insulin aspart  0-9 Units Subcutaneous TID WC  . metoprolol succinate  100 mg Oral  Daily  . pantoprazole  40 mg Oral BID  . pioglitazone  15 mg Oral Daily  . sodium chloride  3 mL Intravenous Q12H    Continuous Infusions:    Time spent: > 25 minutes  Velvet Bathe  [facility] Pager [phone]. If 7PM-7AM, please contact night-coverage at [email or web address], password [facility] 07/31/2014, 6:32 PM  LOS: 9 days

## 2014-08-01 LAB — GLUCOSE, CAPILLARY
GLUCOSE-CAPILLARY: 146 mg/dL — AB (ref 70–99)
Glucose-Capillary: 179 mg/dL — ABNORMAL HIGH (ref 70–99)
Glucose-Capillary: 158 mg/dL — ABNORMAL HIGH (ref 70–99)
Glucose-Capillary: 126 mg/dL — ABNORMAL HIGH (ref 70–99)

## 2014-08-01 NOTE — Progress Notes (Signed)
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 Seen by gastroenterology and underwent EGD finding Dieulafoy lesons, with 2 areas that were clipped and injection of epinephrine.  Since that time, hemoglobin as low as 3.7.  Assessment/Plan: Principal Problem:   Acute respiratory failure with hypoxia secondary to Acute blood loss anemia secondary to upper GI bleed with complications of patient being Jehovah's Witness and refusing blood transfusion: Continue supplemental Iron replacement. Supplemental oxygen prn - pt s/p aranesp injection initially in ED, patient currently feels better. - Will assess CBC next a.m.  Active Problems:     Type II diabetes mellitus with nephropathy: CBGs staying well controlled, under 200    Acute kidney injury in the setting of CKD (chronic kidney disease), stage III:  - On last check 3.0. - given low hgb limits ability to check frequently.  Chronic diastolic heart failure:  - stable patient appears compensated.  Code Status: Full code  Family Communication: Wife and other family members present  Disposition Plan: Pending cbc may d/c next am.   Consultants:  gastroenterology  Procedures:  EGD done 4/1:Dieulafoy lesons--clipped x 2 + Epi 8 cc  Antibiotics:  None   Objective: BP 151/63 mmHg  Pulse 65  Temp(Src) 98.5 F (36.9 C) (Oral)  Resp 18  Ht 5\' 11"  (8.101 m)  Wt 110 kg (242 lb 8.1 oz)  BMI 33.84 kg/m2  SpO2 100%  Intake/Output Summary (Last 24 hours) at 08/01/14 1633 Last data filed at 08/01/14 0342  Gross per 24 hour  Intake    240 ml  Output    550 ml  Net   -310 ml   Filed Weights   07/29/14 0616 07/30/14  0741 07/31/14 0500  Weight: 112.129 kg (247 lb 3.2 oz) 111.449 kg (245 lb 11.2 oz) 110 kg (242 lb 8.1 oz)    Exam:   General:  Alert and awake, sitting up at bedside  Cardiovascular: Regular rate and rhythm, B5-Z0, 2/6 systolic ejection murmur  Respiratory: Clear to auscultation bilaterally, no wheezes  Abdomen: Soft, obese, nontender, positive bowel sounds  Musculoskeletal: No clubbing or cyanosis, trace edema   Data Reviewed: Basic Metabolic Panel:  Recent Labs Lab 07/29/14 0530  NA 137  K 4.9  CL 111  CO2 18*  GLUCOSE 142*  BUN 36*  CREATININE 3.03*  CALCIUM 8.1*   Liver Function Tests: No results for input(s): AST, ALT, ALKPHOS, BILITOT, PROT, ALBUMIN in the last 168 hours. No results for input(s): LIPASE, AMYLASE in the last 168 hours. No results for input(s): AMMONIA in the last 168 hours. CBC:  Recent Labs Lab 07/26/14 0353 07/27/14 0828 07/28/14 0556 07/29/14 0530  WBC 12.3* 12.2* 15.1* 14.2*  NEUTROABS  --   --  12.8*  --   HGB 3.7* 4.2* 4.1* 3.9*  HCT 12.4* 13.8* 13.6* 12.9*  MCV 87.3 87.3 87.2 87.2  PLT 271 267 308 321   Cardiac Enzymes:   No results for input(s): CKTOTAL, CKMB, CKMBINDEX, TROPONINI in the last 168 hours. BNP (last 3 results)  Recent Labs  07/28/14 1404  BNP 248.6*    ProBNP (last 3 results)  Recent Labs  04/09/14 1212  PROBNP 537.6*  CBG:  Recent Labs Lab 07/31/14 1227 07/31/14 1711 07/31/14 2137 08/01/14 0758 08/01/14 1226  GLUCAP 165* 128* 168* 146* 179*    No results found for this or any previous visit (from the past 240 hour(s)).   Studies: No results found.  Scheduled Meds: . amLODipine  5 mg Oral q morning - 10a  . ferrous sulfate  325 mg Oral TID WC  . hydrALAZINE  25 mg Oral BID  . insulin aspart  0-9 Units Subcutaneous TID WC  . metoprolol succinate  100 mg Oral Daily  . pantoprazole  40 mg Oral BID  . pioglitazone  15 mg Oral Daily  . sodium chloride  3 mL Intravenous Q12H     Continuous Infusions:    Time spent: > 25 minutes  Velvet Bathe  [facility] Pager [phone]. If 7PM-7AM, please contact night-coverage at [email or web address], password [facility] 08/01/2014, 4:33 PM  LOS: 10 days

## 2014-08-02 DIAGNOSIS — K625 Hemorrhage of anus and rectum: Secondary | ICD-10-CM

## 2014-08-02 DIAGNOSIS — K648 Other hemorrhoids: Secondary | ICD-10-CM

## 2014-08-02 LAB — CBC
HCT: 14.9 % — ABNORMAL LOW (ref 39.0–52.0)
Hemoglobin: 4.4 g/dL — CL (ref 13.0–17.0)
MCH: 25.4 pg — AB (ref 26.0–34.0)
MCHC: 29.5 g/dL — AB (ref 30.0–36.0)
MCV: 86.1 fL (ref 78.0–100.0)
PLATELETS: 326 10*3/uL (ref 150–400)
RBC: 1.73 MIL/uL — AB (ref 4.22–5.81)
RDW: 23.9 % — ABNORMAL HIGH (ref 11.5–15.5)
WBC: 9.8 10*3/uL (ref 4.0–10.5)

## 2014-08-02 LAB — GLUCOSE, CAPILLARY
GLUCOSE-CAPILLARY: 117 mg/dL — AB (ref 70–99)
GLUCOSE-CAPILLARY: 156 mg/dL — AB (ref 70–99)
Glucose-Capillary: 124 mg/dL — ABNORMAL HIGH (ref 70–99)
Glucose-Capillary: 133 mg/dL — ABNORMAL HIGH (ref 70–99)

## 2014-08-02 MED ORDER — WITCH HAZEL-GLYCERIN EX PADS
MEDICATED_PAD | CUTANEOUS | Status: DC | PRN
  Administered 2014-08-03: 1 via TOPICAL
  Filled 2014-08-02: qty 100

## 2014-08-02 MED ORDER — SODIUM CHLORIDE 0.9 % IV SOLN: INTRAVENOUS | Status: DC

## 2014-08-02 MED ORDER — FERROUS SULFATE 325 (65 FE) MG PO TABS: 325.0000 mg | ORAL_TABLET | Freq: Three times a day (TID) | ORAL | Status: AC

## 2014-08-02 MED ORDER — HYDRALAZINE HCL 25 MG PO TABS: 25.0000 mg | ORAL_TABLET | Freq: Two times a day (BID) | ORAL | Status: AC

## 2014-08-02 MED ORDER — DARBEPOETIN ALFA 40 MCG/0.4ML IJ SOSY
40.0000 ug | PREFILLED_SYRINGE | INTRAMUSCULAR | Status: DC
  Administered 2014-08-02: 40 ug via SUBCUTANEOUS
  Filled 2014-08-02: qty 0.4

## 2014-08-02 MED ORDER — HYDROCORTISONE 2.5 % RE CREA
TOPICAL_CREAM | Freq: Three times a day (TID) | RECTAL | Status: DC
  Administered 2014-08-02: 18:00:00 via RECTAL
  Administered 2014-08-03: 1 via RECTAL
  Administered 2014-08-03 – 2014-08-06 (×9): via RECTAL
  Filled 2014-08-02: qty 28.35

## 2014-08-02 MED ORDER — POLYETHYLENE GLYCOL 3350 17 GM/SCOOP PO POWD
0.5000 | Freq: Once | ORAL | Status: AC
  Administered 2014-08-02: 127.5 g via ORAL
  Filled 2014-08-02: qty 255

## 2014-08-02 NOTE — Progress Notes (Signed)
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 Seen by gastroenterology and underwent EGD finding Dieulafoy lesons, with 2 areas that were clipped and injection of epinephrine.  Since that time, hemoglobin as low as 3.7.  Assessment/Plan: Principal Problem:   Acute respiratory failure with hypoxia secondary to Acute blood loss anemia secondary to upper GI bleed with complications of patient being Jehovah's Witness and refusing blood transfusion: Continue supplemental Iron replacement. Supplemental oxygen prn - pt s/p aranesp injection initially in ED, patient currently feels better. Discussed with hematologist who recommended aranesp administration q 7 days while patient anemic. - Recommend reassessing cbc within the next 1 week or sooner should any new concerns arise. - d/c on supplemental oxygen  Active Problems:     Type II diabetes mellitus with nephropathy: continue home medication regimen.    Acute kidney injury in the setting of CKD (chronic kidney disease), stage III:  - On last check 3.0. - given low hgb limits ability to check frequently. - recommend reassessing in 1 wk  Chronic diastolic heart failure:  - stable patient appears compensated.  Code Status: Full code  Family Communication: Wife and other family members present  Disposition Plan: d/c to SNF   Consultants:  gastroenterology  Procedures:  EGD done 4/1:Dieulafoy lesons--clipped x 2 + Epi 8 cc  Antibiotics:  None   Objective: BP 149/98 mmHg  Pulse 78  Temp(Src) 98 F (36.7 C) (Oral)  Resp 18  Ht 5\' 11"  (1.803 m)  Wt 110.5 kg (243 lb 9.7 oz)  BMI 33.99 kg/m2  SpO2  96%  Intake/Output Summary (Last 24 hours) at 08/02/14 1348 Last data filed at 08/02/14 1224  Gross per 24 hour  Intake    480 ml  Output   1675 ml  Net  -1195 ml   Filed Weights   07/30/14 0741 07/31/14 0500 08/02/14 0504  Weight: 111.449 kg (245 lb 11.2 oz) 110 kg (242 lb 8.1 oz) 110.5 kg (243 lb 9.7 oz)    Exam:   General:  Alert and awake, sitting up at bedside  Cardiovascular: Regular rate and rhythm, C7-E9, 2/6 systolic ejection murmur  Respiratory: Clear to auscultation bilaterally, no wheezes  Abdomen: Soft, obese, nontender, positive bowel sounds  Musculoskeletal: No clubbing or  trace edema   Data Reviewed: Basic Metabolic Panel:  Recent Labs Lab 07/29/14 0530  NA 137  K 4.9  CL 111  CO2 18*  GLUCOSE 142*  BUN 36*  CREATININE 3.03*  CALCIUM 8.1*   Liver Function Tests: No results for input(s): AST, ALT, ALKPHOS, BILITOT, PROT, ALBUMIN in the last 168 hours. No results for input(s): LIPASE, AMYLASE in the last 168 hours. No results for input(s): AMMONIA in the last 168 hours. CBC:  Recent Labs Lab 07/27/14 0828 07/28/14 0556 07/29/14 0530 08/02/14 0553  WBC 12.2* 15.1* 14.2* 9.8  NEUTROABS  --  12.8*  --   --   HGB 4.2* 4.1* 3.9* 4.4*  HCT 13.8* 13.6* 12.9* 14.9*  MCV 87.3 87.2 87.2 86.1  PLT 267 308 321 326   Cardiac Enzymes:   No results for input(s): CKTOTAL, CKMB, CKMBINDEX, TROPONINI in the last 168 hours. BNP (last 3 results)  Recent Labs  07/28/14 1404  BNP 248.6*    ProBNP (last 3 results)  Recent Labs  04/09/14 1212  PROBNP 537.6*    CBG:  Recent Labs Lab 08/01/14 1226 08/01/14 1801 08/01/14 2151 08/02/14 0803 08/02/14 1219  GLUCAP 179* 158* 126* 117* 156*    No results found for this or any previous visit (from the past 240 hour(s)).   Studies: No results found.  Scheduled Meds: . amLODipine  5 mg Oral q morning - 10a  . ferrous sulfate  325 mg Oral TID WC  . hydrALAZINE  25 mg Oral BID  . insulin  aspart  0-9 Units Subcutaneous TID WC  . metoprolol succinate  100 mg Oral Daily  . pantoprazole  40 mg Oral BID  . pioglitazone  15 mg Oral Daily  . sodium chloride  3 mL Intravenous Q12H    Continuous Infusions:    Time spent: > 25 minutes  Velvet Bathe  [facility] Pager [phone]. If 7PM-7AM, please contact night-coverage at [email or web address], password [facility] 08/02/2014, 1:48 PM  LOS: 11 days

## 2014-08-02 NOTE — Clinical Social Work Placement (Addendum)
Clinical Social Work Department CLINICAL SOCIAL WORK PLACEMENT NOTE 08/02/2014  Patient:  Roger Wood, Roger Wood  Account Number:  [id number] Admit date:  07/22/2014  Clinical Social Worker:  Kemper Durie, [place]  Date/time:  08/02/2014 07:36 PM  Clinical Social Work is seeking post-discharge placement for this patient at the following level of care:   [facility]   (*CSW will update this form in Epic as items are completed)   08/02/2014  Patient/family provided with [facility] Department of Clinical Social Work's list of facilities offering this level of care within the geographic area requested by the patient (or if unable, by the patient's family).  08/02/2014  Patient/family informed of their freedom to choose among providers that offer the needed level of care, that participate in Medicare, Medicaid or managed care program needed by the patient, have an available bed and are willing to accept the patient.  08/02/2014  Patient/family informed of [facility]' ownership interest in [facility], as well as of the fact that they are under no obligation to receive care at this facility.  PASARR submitted to EDS on 08/02/2014 PASARR number received on 08/02/2014  FL2 transmitted to all facilities in geographic area requested by pt/family on  08/02/2014 FL2 transmitted to all facilities within larger geographic area on 08/02/2014  Patient informed that his/her managed care company has contracts with or will negotiate with  certain facilities, including the following:     Patient/family informed of bed offers received:  08/02/2014 Patient chooses bed at [facility] Physician recommends and patient chooses bed at    Patient to be transferred to [facility] on  08/06/14  Patient to be transferred to facility by [facility]  Patient and family notified of transfer on 08/06/2014 Name of family member notified:  Pt and wife Delanna Notice    The following physician request were entered in Epic:   Additional Comments:    Liz Beach MSW, [place], [place], [phone]

## 2014-08-02 NOTE — Progress Notes (Signed)
Patient was going to be discharged but had bloody bowel movement prior to discharge. Reconsulted GI for further evaluation and recommendations. Patient likely for EGD next am. Consulted pharmacy for Aranesp administration today.  Vitals reviewed. Nursing reports patient stable.   Will reassess next am.  Velvet Bathe

## 2014-08-02 NOTE — Progress Notes (Signed)
MEDICATION RELATED CONSULT NOTE - INITIAL   Pharmacy Consult for Aranesp Indication: upper GIB in Jehovah's Witness with CKD3  No Known Allergies  Patient Measurements: Height: 5\' 11"  (180.3 cm) Weight: 243 lb 9.7 oz (110.5 kg) IBW/kg (Calculated) : 75.3   Vital Signs: Temp: 98 F (36.7 C) (04/11 0504) Temp Source: Oral (04/11 0504) BP: 149/98 mmHg (04/11 1016) Pulse Rate: 78 (04/11 1016) Intake/Output from previous day: 04/10 0701 - 04/11 0700 In: 480 [P.O.:480] Out: 1225 [Urine:1225] Intake/Output from this shift: Total I/O In: -  Out: 450 [Urine:450]  Labs:  Recent Labs  08/02/14 0553  WBC 9.8  HGB 4.4*  HCT 14.9*  PLT 326   Estimated Creatinine Clearance: 26.6 mL/min (by C-G formula based on Cr of 3.03).   Microbiology: No results found for this or any previous visit (from the past 720 hour(s)).  Medical History: Past Medical History  Diagnosis Date  . Hypertension   . Gout   . Arthritis   . History of radiation therapy 01/27/11-03/26/11    prostate  . Refusal of blood transfusions as patient is Jehovah's Witness   . Cataract     b/l catartact surgery  . Anemia     takes iron supplements  . Prostate cancer 07/14/10    dx  . Diabetes mellitus     adult onset   . Type II diabetes mellitus with nephropathy     Patient denies  . Family history of adverse reaction to anesthesia     sister has difficulty waking up  . Dieulafoy ulcer      Assessment: 29 YOM who was admitted 3/31 with 1 week history of dark stools and coffee ground emesis. Found to have upper GIB. Patient has refused transfusions d/t religious beliefs. Received Aranesp 133mcg subQ on 3/31 as well as iron dextran IV. Patient is currently on PO iron replacement with ferrous sulfate 325mg  PO TIDwc. An iron panel on 4/6 had normal iron, but low Tsat at 18%. Pharmacy asked to dose Aranesp for CKD. Patient has CKD stage 3, anemia could be partly from this, but much more from GIB. Current Hgb  4.4 which is a slight increase from the past few days. Noted Hgb was 11 on 06/23/2014.  Per Dr. Jorge Mandril note, a hematologist recommended Aranesp q7days while patient is anemic. This dosing regimen is typically used for ESRD patients.   Goal of Therapy:  Hgb ~10- targeting normal hemoglobin levels (~13) while on Aranesp place patients at higher risk of cardiovascular events  Plan:  -Aranesp 102mcg subQ- this is slightly below patient's calculated dose of 58mcg; however, typically for a non-ESRD patient, Aranesp is given q4 weeks. Since we will be giving q7days as per hematology verbal recommendations to Dr. Wendee Beavers as documented in his note today, prefer to use lower dose rather than round up to 42mcg to avoid waste. Therapy can begin today as it has been >7 days since last administration -continue PO iron supplementation  Avir Deruiter D. Sharice Harriss, PharmD, BCPS Clinical Pharmacist Pager: [phone] 08/02/2014 2:14 PM

## 2014-08-02 NOTE — Progress Notes (Signed)
          Daily Rounding Note  08/02/2014, 4:39 PM  LOS: 11 days   SUBJECTIVE:       Pt started passing blood from rectum this afternoon after straining to produce BM (not successful, only passed blood).  Constipation is an issue and he saw some scant blood a few days ago after staining to stool.  Was for discharge home Presented 3/31 with CG emesis.  S/p 4/1 EGD by Dr Carlean Purl with clipping x 2 of actively bleeding proximal gastric lesion (cardia vs fundus, Dieulafoy's?).  Pt is Jehovah Witness so has remained inpt to allow for hgb to stableize.  Hgb 10 to 11 in 6 weeks PTA.  4.8 at admission, Nadir of 3.7,  4.4 today.   CKD also contributing to anemia.   OBJECTIVE:         Vital signs in last 24 hours:    Temp:  [97.9 F (36.6 C)-98.5 F (36.9 C)] 97.9 F (36.6 C) (04/11 1440) Pulse Rate:  [64-78] 77 (04/11 1634) Resp:  [2-24] 24 (04/11 1634) BP: (149-188)/(60-98) 188/60 mmHg (04/11 1634) SpO2:  [96 %-98 %] 97 % (04/11 1634) Weight:  [243 lb 9.7 oz (110.5 kg)] 243 lb 9.7 oz (110.5 kg) (04/11 0504) Last BM Date: 07/29/14 Filed Weights   07/30/14 0741 07/31/14 0500 08/02/14 0504  Weight: 245 lb 11.2 oz (111.449 kg) 242 lb 8.1 oz (110 kg) 243 lb 9.7 oz (110.5 kg)   General: obese, looks chronically ill   Heart: RRR Chest: a bit wheezy in upper airway Abdomen: soft, NT, ND, BS hypoactive  Rectal:  Large firm external hemorrhoids, fresh blood, hard stool in rectal vault.  Extremities: no CCE.  Neuro/Psych:  Pleasant, alert, relaxed.   Intake/Output from previous day: 04/10 0701 - 04/11 0700 In: 480 [P.O.:480] Out: 1225 [Urine:1225]  Intake/Output this shift: Total I/O In: 240 [P.O.:240] Out: 875 [Urine:875]  Lab Results:  Recent Labs  08/02/14 0553  WBC 9.8  HGB 4.4*  HCT 14.9*  PLT 326     ASSESMENT:   *  Hematochezia in [place] Witness s/p clipping of lesion ? Dieulafoy's, 4/1.   By exam the pt has hemorrhoids  and BRBPR as well as stool impaction.  *  ABL and chronic anemia. Patient refuses transfusions due to religion.  *  Constipation   PLAN   *  Laxatives and topical hemorrhoid care.   *  Will cancel orders placed for EGD.  Continue diet and oral PPI.     Azucena Freed  08/02/2014, 4:39 PM  Pager: [phone]     Attending physician's note   I have taken an interval history, reviewed the chart and examined the patient. I agree with the Advanced Practitioner's note, impression and recommendations.  Constipation and bleeding hemorrhoids leading to hematochezia. No evidence for recurrent UGI bleeding at this time.  Recommend laxatives, stool softeners and topical hemorrhoidal care.  Continue PPI for h/o recent UGI bleed.  Continue current mgmt for severe ABL and chronic anemia. Pt refuses blood transfusions.  Outpatient GI follow up with Dr. Silvano Rusk as needed. GI signing off.   Pricilla Riffle. Fuller Plan, MD [facility]

## 2014-08-02 NOTE — Clinical Social Work Note (Signed)
Patient's discharge has been cancelled. Helene Kelp has been updated.   Liz Beach MSW, [place], [place], [phone]

## 2014-08-02 NOTE — Progress Notes (Signed)
Notified MD patient had Pilar Plate bloody stool small/medium amounts and patient also c/o of left flank pain.

## 2014-08-02 NOTE — Clinical Social Work Psychosocial (Signed)
Clinical Social Work Department BRIEF PSYCHOSOCIAL ASSESSMENT 08/02/2014  Patient:  Roger Wood, Roger Wood     Account Number:  [id number]     Admit date:  07/22/2014  Clinical Social Worker:  Lovey Newcomer  Date/Time:  08/02/2014 02:00 PM  Referred by:  Physician  Date Referred:  08/02/2014 Referred for  SNF Placement   Other Referral:   NA   Interview type:  Patient Other interview type:   Patient and wife interviewed at bedside.    PSYCHOSOCIAL DATA Living Status:  WIFE Admitted from facility:   Level of care:   Primary support name:  Alice Primary support relationship to patient:  SPOUSE Degree of support available:   Support is strong.    CURRENT CONCERNS Current Concerns  Post-Acute Placement   Other Concerns:   NA    SOCIAL WORK ASSESSMENT / PLAN CSW met with patient and wife at bedside to complete assessment. Patient presents with normal affect but showed little engagement in assessment and preferred CSW speak with wife. Patient and wife seem to have a good understanding of patient's diagnosis and reason for admission. Patient and wife understand that patient needs blood products to increase hemoglobin but state that they are Jahova's Witnesses and it's important for them to follow their faith. Patient refuses blood transfusions in accordance with his religious beliefs.    CSW explained that SNF has been recommended for the patient at discharge. Patient and wife both agree that this is best disposition. Patient and wife state they would prefer to be close to the hospital in case the patient needs to be readmitted. CSW explained SNF search/placement process and answered patient's questions. CSW will follow up with bed offers.   Assessment/plan status:  Psychosocial Support/Ongoing Assessment of Needs Other assessment/ plan:   Complete Fl2, Fax, PASRR   Information/referral to community resources:   CSW contact information and SNF list given.     PATIENT'S/FAMILY'S RESPONSE TO PLAN OF CARE: Patient and patient's wife plans to DC to SNF once medically stable. CSW will assist.       Liz Beach MSW, [place], [place], [phone]

## 2014-08-03 ENCOUNTER — Encounter (HOSPITAL_COMMUNITY)
Admission: EM | Disposition: A | Discharge status: Skilled Nursing Facility | Payer: Self-pay | Source: Home / Self Care | Attending: Family Medicine

## 2014-08-03 LAB — GLUCOSE, CAPILLARY
GLUCOSE-CAPILLARY: 139 mg/dL — AB (ref 70–99)
GLUCOSE-CAPILLARY: 190 mg/dL — AB (ref 70–99)
Glucose-Capillary: 123 mg/dL — ABNORMAL HIGH (ref 70–99)
Glucose-Capillary: 183 mg/dL — ABNORMAL HIGH (ref 70–99)

## 2014-08-03 LAB — URINALYSIS, ROUTINE W REFLEX MICROSCOPIC
Bilirubin Urine: NEGATIVE
Glucose, UA: NEGATIVE mg/dL
Hgb urine dipstick: NEGATIVE
Ketones, ur: NEGATIVE mg/dL
Leukocytes, UA: NEGATIVE
Nitrite: NEGATIVE
Protein, ur: >300 mg/dL — AB
Specific Gravity, Urine: 1.013 (ref 1.005–1.030)
Urobilinogen, UA: 0.2 mg/dL (ref 0.0–1.0)
pH: 5 (ref 5.0–8.0)

## 2014-08-03 LAB — URINE MICROSCOPIC-ADD ON

## 2014-08-03 SURGERY — EGD (ESOPHAGOGASTRODUODENOSCOPY)
Anesthesia: Moderate Sedation

## 2014-08-03 MED ORDER — SODIUM CHLORIDE 0.9 % IV BOLUS (SEPSIS)
500.0000 mL | Freq: Once | INTRAVENOUS | Status: AC
Start: 2014-08-03 — End: 2014-08-03
  Administered 2014-08-03: 500 mL via INTRAVENOUS

## 2014-08-03 MED ORDER — SODIUM CHLORIDE 0.9 % IV SOLN
510.0000 mg | Freq: Once | INTRAVENOUS | Status: AC
  Administered 2014-08-03: 510 mg via INTRAVENOUS
  Filled 2014-08-03: qty 17

## 2014-08-03 MED ORDER — ENSURE PUDDING PO PUDG
1.0000 | Freq: Three times a day (TID) | ORAL | Status: DC
  Administered 2014-08-03 – 2014-08-06 (×9): 1 via ORAL

## 2014-08-03 NOTE — Progress Notes (Signed)
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 Seen by gastroenterology and underwent EGD finding Dieulafoy lesons, with 2 areas that were clipped and injection of epinephrine.    Assessment/Plan: Principal Problem:   Acute respiratory failure with hypoxia secondary to Acute blood loss anemia secondary to upper GI bleed with complications of patient being Jehovah's Witness and refusing blood transfusion: Continue supplemental Iron replacement. Supplemental oxygen prn - pt s/p aranesp injection initially in ED, patient currently feels better. Discussed with hematologist who recommended aranesp administration q 7 days while patient anemic. - Patient had some rectal bleeding secondary to hemorrhoids. GI recommending Laxatives, stool softeners, topical hemorrhoidal care. - Discussed with Hematologist on call and plans will be for IV iron administration.  Unfortunately given patient's religious beliefs we are not able to transfuse. Discussed with family that patient is at risk for dying and that there is not much we can do besides the treatment options we are currently administering.  - Disposition plan will be for SNF most likely next am.  Active Problems:     Type II diabetes mellitus with nephropathy: continue home medication regimen.    Acute kidney injury in the setting of CKD (chronic kidney disease), stage III:  - On last check 3.0. - given low hgb limits ability to check frequently. - recommend reassessing in 1 wk  Chronic diastolic heart failure:  - stable patient appears compensated.  Code Status: Full code  Family Communication: Wife and other family members  present  Disposition Plan: d/c to SNF most likely 08/04/14   Consultants:  gastroenterology  Procedures:  EGD done 4/1:Dieulafoy lesons--clipped x 2 + Epi 8 cc  Antibiotics:  None   Objective: BP 160/48 mmHg  Pulse 77  Temp(Src) 99.3 F (37.4 C) (Oral)  Resp 20  Ht 5\' 11"  (1.803 m)  Wt 111 kg (244 lb 11.4 oz)  BMI 34.15 kg/m2  SpO2 100%  Intake/Output Summary (Last 24 hours) at 08/03/14 1313 Last data filed at 08/03/14 1200  Gross per 24 hour  Intake    240 ml  Output   1860 ml  Net  -1620 ml   Filed Weights   07/31/14 0500 08/02/14 0504 08/03/14 0500  Weight: 110 kg (242 lb 8.1 oz) 110.5 kg (243 lb 9.7 oz) 111 kg (244 lb 11.4 oz)    Exam:   General:  Alert and awake, sitting up at bedside  Cardiovascular: Regular rate and rhythm, N3-Z7, 2/6 systolic ejection murmur  Respiratory: Clear to auscultation bilaterally, no wheezes  Abdomen: Soft, obese, nontender, positive bowel sounds  Musculoskeletal: No clubbing or  trace edema   Data Reviewed: Basic Metabolic Panel:  Recent Labs Lab 07/29/14 0530  NA 137  K 4.9  CL 111  CO2 18*  GLUCOSE 142*  BUN 36*  CREATININE 3.03*  CALCIUM 8.1*   Liver Function Tests: No results for input(s): AST, ALT, ALKPHOS, BILITOT, PROT, ALBUMIN in the last 168 hours. No results for input(s): LIPASE, AMYLASE in the last 168 hours. No results for input(s): AMMONIA in the last 168 hours. CBC:  Recent Labs Lab 07/28/14 0556 07/29/14 0530 08/02/14 0553  WBC 15.1* 14.2* 9.8  NEUTROABS 12.8*  --   --  HGB 4.1* 3.9* 4.4*  HCT 13.6* 12.9* 14.9*  MCV 87.2 87.2 86.1  PLT 308 321 326   Cardiac Enzymes:   No results for input(s): CKTOTAL, CKMB, CKMBINDEX, TROPONINI in the last 168 hours. BNP (last 3 results)  Recent Labs  07/28/14 1404  BNP 248.6*    ProBNP (last 3 results)  Recent Labs  04/09/14 1212  PROBNP 537.6*    CBG:  Recent Labs Lab 08/02/14 1219 08/02/14 1718 08/02/14 2157  08/03/14 0759 08/03/14 1150  GLUCAP 156* 124* 133* 139* 123*    No results found for this or any previous visit (from the past 240 hour(s)).   Studies: No results found.  Scheduled Meds: . amLODipine  5 mg Oral q morning - 10a  . darbepoetin (ARANESP) injection - NON-DIALYSIS  40 mcg Subcutaneous Q Mon  . ferrous sulfate  325 mg Oral TID WC  . ferumoxytol  510 mg Intravenous Once  . hydrALAZINE  25 mg Oral BID  . hydrocortisone   Rectal TID  . insulin aspart  0-9 Units Subcutaneous TID WC  . metoprolol succinate  100 mg Oral Daily  . pantoprazole  40 mg Oral BID  . pioglitazone  15 mg Oral Daily  . sodium chloride  3 mL Intravenous Q12H    Continuous Infusions:    Time spent: > 35 minutes  Velvet Bathe  [facility] Pager [phone]. If 7PM-7AM, please contact night-coverage at [email or web address], password [facility] 08/03/2014, 1:13 PM  LOS: 12 days

## 2014-08-03 NOTE — Progress Notes (Signed)
Pt had a large BM, no blood was present. Hemorid cream and tucks pads applied.

## 2014-08-03 NOTE — Progress Notes (Signed)
PT Cancellation Note  Patient Details Name: Roger Wood MRN: [id number] DOB: Jul 09, 1940   Cancelled Treatment:    Reason Eval/Treat Not Completed: Patient  Has not been doing well this am.  Pt's family states he is not ready to participate today.  Will try again 8/13 as able. 08/03/2014  Donnella Sham, [place] [phone]  (pager)   Kawana Hegel, Tessie Fass 08/03/2014, 2:48 PM

## 2014-08-04 ENCOUNTER — Other Ambulatory Visit: Payer: Medicare Other

## 2014-08-04 ENCOUNTER — Ambulatory Visit: Payer: Medicare Other

## 2014-08-04 ENCOUNTER — Inpatient Hospital Stay (HOSPITAL_COMMUNITY): Payer: Medicare Other

## 2014-08-04 LAB — GLUCOSE, CAPILLARY
GLUCOSE-CAPILLARY: 157 mg/dL — AB (ref 70–99)
Glucose-Capillary: 135 mg/dL — ABNORMAL HIGH (ref 70–99)
Glucose-Capillary: 183 mg/dL — ABNORMAL HIGH (ref 70–99)
Glucose-Capillary: 192 mg/dL — ABNORMAL HIGH (ref 70–99)

## 2014-08-04 NOTE — Clinical Social Work Note (Signed)
Clinical Social Worker continuing to follow patient and family for support and discharge planning needs.  Per MD, patient running a fever and medically stable for discharge.  CSW has notified [facility] by phone that patient will likely discharge tomorrow 04/14.  Facility agreeable and bed remains available.  RN to update patient family at bedside.  CSW remains available for support and to facilitate patient discharge needs once medically ready.  Barbette Or, [place]

## 2014-08-04 NOTE — Progress Notes (Signed)
PT Cancellation Note  Patient Details Name: Roger Wood MRN: [id number] DOB: [birth year]   Cancelled Treatment:    Reason Eval/Treat Not Completed: Other (comment) (Again family states that he is unable to stay awake and focus on therapy.  Will defer again today and try tomorrow. 08/04/2014  Donnella Sham, [place] [phone]  (pager)   [name], Tessie Fass 08/04/2014, 4:33 PM

## 2014-08-04 NOTE — Progress Notes (Signed)
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 Seen by gastroenterology and underwent EGD finding Dieulafoy lesons, with 2 areas that were clipped and injection of epinephrine.    Assessment/Plan: Principal Problem:   Acute respiratory failure with hypoxia secondary to Acute blood loss anemia secondary to upper GI bleed with complications of patient being Jehovah's Witness and refusing blood transfusion: Continue supplemental Iron replacement. Supplemental oxygen prn - pt s/p aranesp injection initially in ED, patient currently feels better. Discussed with hematologist who recommended aranesp administration q 7 days while patient anemic. - Patient had some rectal bleeding secondary to hemorrhoids. GI recommending Laxatives, stool softeners, topical hemorrhoidal care. - Discussed with Hematologist on call and plans will be for IV iron administration.  Unfortunately given patient's religious beliefs we are not able to transfuse. Discussed with family that patient is at risk for dying and that there is not much we can do besides the treatment options we are currently administering.  - Disposition plan will be for SNF once medically stable  Fever of unknown origin - assess cbc next am - blood cultures x 2 obtained - Will avoid antibiotics at this juncture as there is no source of infection. - obtain chest x ray portable - obtain ua - Should fevers recur will recommend placing on broad spectrum antibiotics.  Active Problems:     Type II diabetes mellitus with nephropathy: continue home medication regimen.    Acute kidney injury in the setting of CKD (chronic kidney disease), stage III:  -  On last check 3.0. - given low hgb limits ability to check frequently. - recommend reassessing in 1 wk  Chronic diastolic heart failure:  - stable patient appears compensated.  Code Status: Full code  Family Communication: Wife and other family members present  Disposition Plan: d/c to SNF most likely 08/04/14   Consultants:  gastroenterology  Procedures:  EGD done 4/1:Dieulafoy lesons--clipped x 2 + Epi 8 cc  Antibiotics:  None   Objective: BP 160/55 mmHg  Pulse 70  Temp(Src) 98.3 F (36.8 C) (Oral)  Resp 20  Ht 5\' 11"  (1.803 m)  Wt 113 kg (249 lb 1.9 oz)  BMI 34.76 kg/m2  SpO2 97%  Intake/Output Summary (Last 24 hours) at 08/04/14 1736 Last data filed at 08/04/14 1022  Gross per 24 hour  Intake    500 ml  Output    775 ml  Net   -275 ml   Filed Weights   08/02/14 0504 08/03/14 0500 08/04/14 0520  Weight: 110.5 kg (243 lb 9.7 oz) 111 kg (244 lb 11.4 oz) 113 kg (249 lb 1.9 oz)    Exam:   General:  Alert and awake, in nad  Cardiovascular: Regular rate and rhythm, Z7-Q7, 2/6 systolic ejection murmur  Respiratory: Clear to auscultation bilaterally, no wheezes, equal chest rise  Abdomen: Soft, obese, nontender, positive bowel sounds  Musculoskeletal: No clubbing or  trace edema   Data Reviewed: Basic Metabolic Panel:  Recent Labs Lab 07/29/14 0530  NA 137  K 4.9  CL 111  CO2 18*  GLUCOSE 142*  BUN 36*  CREATININE 3.03*  CALCIUM 8.1*   Liver Function Tests: No results for input(s): AST, ALT, ALKPHOS, BILITOT, PROT,  ALBUMIN in the last 168 hours. No results for input(s): LIPASE, AMYLASE in the last 168 hours. No results for input(s): AMMONIA in the last 168 hours. CBC:  Recent Labs Lab 07/29/14 0530 08/02/14 0553  WBC 14.2* 9.8  HGB 3.9* 4.4*  HCT 12.9* 14.9*  MCV 87.2 86.1  PLT 321 326   Cardiac Enzymes:   No results for input(s): CKTOTAL, CKMB, CKMBINDEX, TROPONINI in the last 168 hours. BNP (last 3 results)  Recent Labs   07/28/14 1404  BNP 248.6*    ProBNP (last 3 results)  Recent Labs  04/09/14 1212  PROBNP 537.6*    CBG:  Recent Labs Lab 08/03/14 1652 08/03/14 2152 08/04/14 0803 08/04/14 1155 08/04/14 1706  GLUCAP 190* 183* 135* 157* 183*    No results found for this or any previous visit (from the past 240 hour(s)).   Studies: No results found.  Scheduled Meds: . amLODipine  5 mg Oral q morning - 10a  . darbepoetin (ARANESP) injection - NON-DIALYSIS  40 mcg Subcutaneous Q Mon  . feeding supplement (ENSURE)  1 Container Oral TID BM  . ferrous sulfate  325 mg Oral TID WC  . hydrALAZINE  25 mg Oral BID  . hydrocortisone   Rectal TID  . insulin aspart  0-9 Units Subcutaneous TID WC  . metoprolol succinate  100 mg Oral Daily  . pantoprazole  40 mg Oral BID  . pioglitazone  15 mg Oral Daily  . sodium chloride  3 mL Intravenous Q12H    Continuous Infusions:    Time spent: > 35 minutes  Velvet Bathe  [facility] Pager [phone]. If 7PM-7AM, please contact night-coverage at [email or web address], password [facility] 08/04/2014, 5:36 PM  LOS: 13 days

## 2014-08-04 NOTE — Progress Notes (Signed)
Patient's B/P 170/56. Will continue to monitor. Dr. Wendee Beavers made aware by text.

## 2014-08-04 NOTE — Progress Notes (Signed)
Patient's right arm and hand tremoring. Started this afternoon.

## 2014-08-05 LAB — URINE MICROSCOPIC-ADD ON

## 2014-08-05 LAB — CBC
HCT: 14.9 % — ABNORMAL LOW (ref 39.0–52.0)
HEMOGLOBIN: 4.6 g/dL — AB (ref 13.0–17.0)
MCH: 25.8 pg — AB (ref 26.0–34.0)
MCHC: 30.9 g/dL (ref 30.0–36.0)
MCV: 83.7 fL (ref 78.0–100.0)
Platelets: 290 10*3/uL (ref 150–400)
RBC: 1.78 MIL/uL — AB (ref 4.22–5.81)
RDW: 22.9 % — ABNORMAL HIGH (ref 11.5–15.5)
WBC: 12.8 10*3/uL — ABNORMAL HIGH (ref 4.0–10.5)

## 2014-08-05 LAB — URINALYSIS, ROUTINE W REFLEX MICROSCOPIC
Bilirubin Urine: NEGATIVE
Glucose, UA: NEGATIVE mg/dL
HGB URINE DIPSTICK: NEGATIVE
KETONES UR: NEGATIVE mg/dL
LEUKOCYTES UA: NEGATIVE
NITRITE: NEGATIVE
Specific Gravity, Urine: 1.013 (ref 1.005–1.030)
UROBILINOGEN UA: 0.2 mg/dL (ref 0.0–1.0)
pH: 5 (ref 5.0–8.0)

## 2014-08-05 LAB — BASIC METABOLIC PANEL
Anion gap: 12 (ref 5–15)
BUN: 59 mg/dL — AB (ref 6–23)
CALCIUM: 8.1 mg/dL — AB (ref 8.4–10.5)
CO2: 16 mmol/L — ABNORMAL LOW (ref 19–32)
Chloride: 101 mmol/L (ref 96–112)
Creatinine, Ser: 4.61 mg/dL — ABNORMAL HIGH (ref 0.50–1.35)
GFR calc Af Amer: 13 mL/min — ABNORMAL LOW (ref >90)
GFR, EST NON AFRICAN AMERICAN: 11 mL/min — AB (ref >90)
Glucose, Bld: 222 mg/dL — ABNORMAL HIGH (ref 70–99)
Potassium: 6.1 mmol/L (ref 3.5–5.1)
Sodium: 129 mmol/L — ABNORMAL LOW (ref 135–145)

## 2014-08-05 LAB — GLUCOSE, CAPILLARY
GLUCOSE-CAPILLARY: 146 mg/dL — AB (ref 70–99)
GLUCOSE-CAPILLARY: 244 mg/dL — AB (ref 70–99)
GLUCOSE-CAPILLARY: 180 mg/dL — AB (ref 70–99)
Glucose-Capillary: 146 mg/dL — ABNORMAL HIGH (ref 70–99)

## 2014-08-05 MED ORDER — FUROSEMIDE 10 MG/ML IJ SOLN
40.0000 mg | Freq: Once | INTRAMUSCULAR | Status: AC
  Administered 2014-08-05: 40 mg via INTRAVENOUS
  Filled 2014-08-05: qty 4

## 2014-08-05 MED ORDER — SODIUM POLYSTYRENE SULFONATE 15 GM/60ML PO SUSP
15.0000 g | Freq: Once | ORAL | Status: AC
  Administered 2014-08-05: 15 g via ORAL
  Filled 2014-08-05: qty 60

## 2014-08-05 MED ORDER — SENNA 8.6 MG PO TABS
1.0000 | ORAL_TABLET | Freq: Every day | ORAL | Status: DC
  Administered 2014-08-05 – 2014-08-06 (×2): 8.6 mg via ORAL
  Filled 2014-08-05 (×3): qty 1

## 2014-08-05 MED ORDER — CEFTRIAXONE SODIUM IN DEXTROSE 20 MG/ML IV SOLN
1.0000 g | INTRAVENOUS | Status: DC
  Administered 2014-08-05: 1 g via INTRAVENOUS
  Filled 2014-08-05 (×2): qty 50

## 2014-08-05 MED ORDER — DICLOFENAC SODIUM 1 % TD GEL
2.0000 g | Freq: Four times a day (QID) | TRANSDERMAL | Status: DC | PRN
  Administered 2014-08-05: 2 g via TOPICAL
  Filled 2014-08-05: qty 100

## 2014-08-05 NOTE — Progress Notes (Signed)
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 Seen by gastroenterology and underwent EGD finding Dieulafoy lesons, with 2 areas that were clipped and injection of epinephrine.    Assessment/Plan: Principal Problem:   Acute respiratory failure with hypoxia secondary to Acute blood loss anemia secondary to upper GI bleed with complications of patient being Jehovah's Witness and refusing blood transfusion: Continue supplemental Iron replacement. Supplemental oxygen prn - pt s/p aranesp injection initially in ED, patient currently feels better. Discussed with hematologist who recommended aranesp administration q 7 days while patient anemic. - Patient had some rectal bleeding secondary to hemorrhoids. GI recommending Laxatives, stool softeners, topical hemorrhoidal care. - Discussed with Hematologist on call and plans will be for IV iron administration.  Unfortunately given patient's religious beliefs we are not able to transfuse. Discussed with family that patient is at risk for dying and that there is not much we can do besides the treatment options we are currently administering.  - Disposition plan will be for SNF once medically stable  Fever of unknown origin/ SIRs - assess cbc next am - blood cultures x 2 obtained and pending but no growth to date. - Cover with Rocephin at this juncture although there is no source of infection - obtain ua - Should fevers recur will recommend placing on broad spectrum antibiotics.  Hyperkalemia - lasix provided x 1 time - reassess next am - kayexalate x 1 time.  Active Problems:     Type II diabetes mellitus with nephropathy: continue home  medication regimen.    Acute kidney injury in the setting of CKD (chronic kidney disease), stage III:  - On last check 3.0. - given low hgb limits ability to check frequently. - recommend reassessing in 1 wk  Chronic diastolic heart failure:  - stable patient appears compensated. - Chest x ray reporting fingings for recurrent CHF with probable left effusion  Code Status: Full code  Family Communication: Wife and other family members present  Disposition Plan: d/c to SNF when fevers subside   Consultants:  gastroenterology  Procedures:  EGD done 4/1:Dieulafoy lesons--clipped x 2 + Epi 8 cc  Antibiotics:  Rocephin   Objective: BP 170/60 mmHg  Pulse 88  Temp(Src) 99.8 F (37.7 C) (Oral)  Resp 16  Ht 5\' 11"  (1.803 m)  Wt 111.948 kg (246 lb 12.8 oz)  BMI 34.44 kg/m2  SpO2 100%  Intake/Output Summary (Last 24 hours) at 08/05/14 1542 Last data filed at 08/04/14 2303  Gross per 24 hour  Intake    100 ml  Output   1861 ml  Net  -1761 ml   Filed Weights   08/03/14 0500 08/04/14 0520 08/05/14 0637  Weight: 111 kg (244 lb 11.4 oz) 113 kg (249 lb 1.9 oz) 111.948 kg (246 lb 12.8 oz)    Exam:   General:  Alert and awake, in nad  Cardiovascular: Regular rate and rhythm, K4-Y1, 2/6 systolic ejection murmur  Respiratory: Clear to auscultation bilaterally, no wheezes, equal chest rise  Abdomen: Soft, obese, nontender, positive bowel sounds  Musculoskeletal: No clubbing or  trace edema   Data Reviewed: Basic Metabolic Panel:  Recent Labs Lab 08/05/14 1355  NA 129*  K 6.1*  CL  101  CO2 16*  GLUCOSE 222*  BUN 59*  CREATININE 4.61*  CALCIUM 8.1*   Liver Function Tests: No results for input(s): AST, ALT, ALKPHOS, BILITOT, PROT, ALBUMIN in the last 168 hours. No results for input(s): LIPASE, AMYLASE in the last 168 hours. No results for input(s): AMMONIA in the last 168 hours. CBC:  Recent Labs Lab 08/02/14 0553 08/05/14 0640  WBC 9.8 12.8*  HGB  4.4* 4.6*  HCT 14.9* 14.9*  MCV 86.1 83.7  PLT 326 290   Cardiac Enzymes:   No results for input(s): CKTOTAL, CKMB, CKMBINDEX, TROPONINI in the last 168 hours. BNP (last 3 results)  Recent Labs  07/28/14 1404  BNP 248.6*    ProBNP (last 3 results)  Recent Labs  04/09/14 1212  PROBNP 537.6*    CBG:  Recent Labs Lab 08/04/14 1155 08/04/14 1706 08/04/14 2202 08/05/14 0806 08/05/14 1201  GLUCAP 157* 183* 192* 146* 244*    Recent Results (from the past 240 hour(s))  Culture, blood (routine x 2)     Status: None (Preliminary result)   Collection Time: 08/03/14 11:21 PM  Result Value Ref Range Status   Specimen Description BLOOD RIGHT ANTECUBITAL  Final   Special Requests BOTTLES DRAWN AEROBIC ONLY 10CC  Final   Culture   Final           BLOOD CULTURE RECEIVED NO GROWTH TO DATE CULTURE WILL BE HELD FOR 5 DAYS BEFORE ISSUING A FINAL NEGATIVE REPORT Performed at [facility]    Report Status PENDING  Incomplete  Culture, blood (routine x 2)     Status: None (Preliminary result)   Collection Time: 08/03/14 11:25 PM  Result Value Ref Range Status   Specimen Description BLOOD RIGHT HAND  Final   Special Requests BOTTLES DRAWN AEROBIC ONLY 5CC  Final   Culture   Final           BLOOD CULTURE RECEIVED NO GROWTH TO DATE CULTURE WILL BE HELD FOR 5 DAYS BEFORE ISSUING A FINAL NEGATIVE REPORT Performed at [facility]    Report Status PENDING  Incomplete     Studies: Dg Chest Port 1 View  08/04/2014   CLINICAL DATA:  Fever of unknown origin for 2 days.  EXAM: PORTABLE CHEST - 1 VIEW  COMPARISON:  07/28/2014  FINDINGS: The heart is enlarged but stable. There is central vascular congestion and probable interstitial edema. Possible small left effusion and left basilar atelectasis. Stable linear band of left upper lobe atelectasis. The bony thorax is grossly intact.  IMPRESSION: Persistent or recurrent CHF with probable left effusion and left lower lobe  atelectasis.  Stable discoid atelectasis in the left upper lobe   Electronically Signed   By: Marijo Sanes M.D.   On: 08/04/2014 18:26    Scheduled Meds: . amLODipine  5 mg Oral q morning - 10a  . cefTRIAXone (ROCEPHIN)  IV  1 g Intravenous Q24H  . darbepoetin (ARANESP) injection - NON-DIALYSIS  40 mcg Subcutaneous Q Mon  . feeding supplement (ENSURE)  1 Container Oral TID BM  . ferrous sulfate  325 mg Oral TID WC  . hydrALAZINE  25 mg Oral BID  . hydrocortisone   Rectal TID  . insulin aspart  0-9 Units Subcutaneous TID WC  . metoprolol succinate  100 mg Oral Daily  . pantoprazole  40 mg Oral BID  . pioglitazone  15 mg Oral Daily  . senna  1 tablet Oral Daily  . sodium chloride  3 mL Intravenous Q12H  . sodium polystyrene  15 g Oral Once    Continuous Infusions:    Time spent: > 35 minutes  Velvet Bathe  [facility] Pager [phone]. If 7PM-7AM, please contact night-coverage at [email or web address], password [facility] 08/05/2014, 3:42 PM  LOS: 14 days

## 2014-08-05 NOTE — Clinical Social Work Note (Signed)
Clinical Social Worker continuing to follow patient and family for support and discharge planning needs. Per MD, patient running a fever and not medically stable for discharge. CSW has notified [facility] by phone regarding patient barriers for discharge. Facility agreeable and bed remains available. RN to update patient family at bedside. CSW remains available for support and to facilitate patient discharge needs once medically ready.  Roger Wood, [place]

## 2014-08-05 NOTE — Progress Notes (Signed)
PT Cancellation Note  Patient Details Name: Roger Wood MRN: [id number] DOB: 03-23-1941   Cancelled Treatment:    Reason Eval/Treat Not Completed: Other (comment).  Pt still running fevers, has kidney failure and Hgb at 4.6. Not able to participat with therapy. 08/05/2014  Donnella Sham, [place] [phone]  (pager)   Melva Faux, Tessie Fass 08/05/2014, 4:48 PM

## 2014-08-05 NOTE — Progress Notes (Signed)
Pharmacy asked to dose ceftriaxone for fever of unknown origin.  Tmax 101.6, WBC 12.8 this morning.  No recent BMET- last from 4/6- scr was 3.03 at that time. Likely worsened since patient has been retaining urine.   However, Ceftriaxone requires NO dose adjustment based on renal function.   Plan: -ceftriaxone 1g IV q24h -pharmacy to sign off as no further dose adjustments required.  Montel Vanderhoof D. Taite Schoeppner, PharmD, BCPS Clinical Pharmacist Pager: [phone] 08/05/2014 12:03 PM

## 2014-08-05 NOTE — Progress Notes (Signed)
MD Wendee Beavers paged patient potassium 6.1, sodium 129, asked for intervention.

## 2014-08-05 NOTE — Progress Notes (Signed)
Medicare Important Message given?  YES (If response is "NO", the following Medicare IM given date fields will be blank) Date Medicare IM given:  08/05/14 Medicare IM given by:  Tomi Bamberger

## 2014-08-06 LAB — CBC
HCT: 14.2 % — ABNORMAL LOW (ref 39.0–52.0)
HEMOGLOBIN: 4.6 g/dL — AB (ref 13.0–17.0)
MCH: 26.6 pg (ref 26.0–34.0)
MCHC: 32.4 g/dL (ref 30.0–36.0)
MCV: 82.1 fL (ref 78.0–100.0)
Platelets: 314 10*3/uL (ref 150–400)
RBC: 1.73 MIL/uL — ABNORMAL LOW (ref 4.22–5.81)
RDW: 23.4 % — ABNORMAL HIGH (ref 11.5–15.5)
WBC: 11 10*3/uL — ABNORMAL HIGH (ref 4.0–10.5)

## 2014-08-06 LAB — BASIC METABOLIC PANEL
ANION GAP: 15 (ref 5–15)
BUN: 62 mg/dL — ABNORMAL HIGH (ref 6–23)
CALCIUM: 8.3 mg/dL — AB (ref 8.4–10.5)
CO2: 16 mmol/L — AB (ref 19–32)
Chloride: 100 mmol/L (ref 96–112)
Creatinine, Ser: 4.85 mg/dL — ABNORMAL HIGH (ref 0.50–1.35)
GFR calc Af Amer: 12 mL/min — ABNORMAL LOW (ref >90)
GFR calc non Af Amer: 11 mL/min — ABNORMAL LOW (ref >90)
Glucose, Bld: 125 mg/dL — ABNORMAL HIGH (ref 70–99)
POTASSIUM: 5.1 mmol/L (ref 3.5–5.1)
SODIUM: 131 mmol/L — AB (ref 135–145)

## 2014-08-06 LAB — URINE CULTURE
COLONY COUNT: NO GROWTH
CULTURE: NO GROWTH

## 2014-08-06 LAB — GLUCOSE, CAPILLARY
Glucose-Capillary: 140 mg/dL — ABNORMAL HIGH (ref 70–99)
Glucose-Capillary: 174 mg/dL — ABNORMAL HIGH (ref 70–99)

## 2014-08-06 LAB — SEDIMENTATION RATE: Sed Rate: >140 mm/hr — ABNORMAL HIGH (ref 0–16)

## 2014-08-06 MED ORDER — HYDROCORTISONE 2.5 % RE CREA: TOPICAL_CREAM | Freq: Three times a day (TID) | RECTAL | Status: AC

## 2014-08-06 MED ORDER — CEFDINIR 300 MG PO CAPS: 300.0000 mg | ORAL_CAPSULE | Freq: Two times a day (BID) | ORAL | Status: DC

## 2014-08-06 NOTE — Progress Notes (Signed)
Report called to [facility] 

## 2014-08-10 LAB — CULTURE, BLOOD (ROUTINE X 2)
Culture: NO GROWTH
Culture: NO GROWTH

## 2014-08-24 ENCOUNTER — Emergency Department (HOSPITAL_COMMUNITY): Payer: Medicare Other

## 2014-08-24 ENCOUNTER — Encounter (HOSPITAL_COMMUNITY): Payer: Self-pay | Admitting: Family Medicine

## 2014-08-24 ENCOUNTER — Other Ambulatory Visit (HOSPITAL_COMMUNITY): Payer: Self-pay

## 2014-08-24 ENCOUNTER — Inpatient Hospital Stay (HOSPITAL_COMMUNITY)
Admission: EM | Admit: 2014-08-24 | Discharge: 2014-08-26 | DRG: 189 | Disposition: A | Discharge status: Home / Self Care | Payer: Medicare Other | Attending: Internal Medicine | Admitting: Internal Medicine

## 2014-08-24 DIAGNOSIS — M109 Gout, unspecified: Secondary | ICD-10-CM | POA: Diagnosis present

## 2014-08-24 DIAGNOSIS — Z794 Long term (current) use of insulin: Secondary | ICD-10-CM

## 2014-08-24 DIAGNOSIS — E875 Hyperkalemia: Secondary | ICD-10-CM

## 2014-08-24 DIAGNOSIS — N189 Chronic kidney disease, unspecified: Secondary | ICD-10-CM | POA: Diagnosis present

## 2014-08-24 DIAGNOSIS — J9621 Acute and chronic respiratory failure with hypoxia: Principal | ICD-10-CM | POA: Diagnosis present

## 2014-08-24 DIAGNOSIS — R0600 Dyspnea, unspecified: Secondary | ICD-10-CM

## 2014-08-24 DIAGNOSIS — I5030 Unspecified diastolic (congestive) heart failure: Secondary | ICD-10-CM | POA: Diagnosis present

## 2014-08-24 DIAGNOSIS — Z87891 Personal history of nicotine dependence: Secondary | ICD-10-CM

## 2014-08-24 DIAGNOSIS — I129 Hypertensive chronic kidney disease with stage 1 through stage 4 chronic kidney disease, or unspecified chronic kidney disease: Secondary | ICD-10-CM | POA: Diagnosis present

## 2014-08-24 DIAGNOSIS — N179 Acute kidney failure, unspecified: Secondary | ICD-10-CM | POA: Insufficient documentation

## 2014-08-24 DIAGNOSIS — Z886 Allergy status to analgesic agent status: Secondary | ICD-10-CM

## 2014-08-24 DIAGNOSIS — Z923 Personal history of irradiation: Secondary | ICD-10-CM

## 2014-08-24 DIAGNOSIS — D472 Monoclonal gammopathy: Secondary | ICD-10-CM | POA: Diagnosis present

## 2014-08-24 DIAGNOSIS — J9601 Acute respiratory failure with hypoxia: Secondary | ICD-10-CM

## 2014-08-24 DIAGNOSIS — E1121 Type 2 diabetes mellitus with diabetic nephropathy: Secondary | ICD-10-CM | POA: Diagnosis present

## 2014-08-24 DIAGNOSIS — N184 Chronic kidney disease, stage 4 (severe): Secondary | ICD-10-CM | POA: Diagnosis present

## 2014-08-24 DIAGNOSIS — D649 Anemia, unspecified: Secondary | ICD-10-CM

## 2014-08-24 DIAGNOSIS — Z993 Dependence on wheelchair: Secondary | ICD-10-CM

## 2014-08-24 DIAGNOSIS — M1990 Unspecified osteoarthritis, unspecified site: Secondary | ICD-10-CM | POA: Diagnosis present

## 2014-08-24 DIAGNOSIS — R0602 Shortness of breath: Secondary | ICD-10-CM

## 2014-08-24 DIAGNOSIS — I10 Essential (primary) hypertension: Secondary | ICD-10-CM | POA: Diagnosis present

## 2014-08-24 DIAGNOSIS — Z8546 Personal history of malignant neoplasm of prostate: Secondary | ICD-10-CM

## 2014-08-24 DIAGNOSIS — D638 Anemia in other chronic diseases classified elsewhere: Secondary | ICD-10-CM | POA: Diagnosis present

## 2014-08-24 LAB — BASIC METABOLIC PANEL
Anion gap: 14 (ref 5–15)
BUN: 45 mg/dL — ABNORMAL HIGH (ref 6–20)
CHLORIDE: 96 mmol/L — AB (ref 101–111)
CO2: 21 mmol/L — ABNORMAL LOW (ref 22–32)
Calcium: 9 mg/dL (ref 8.9–10.3)
Creatinine, Ser: 4.13 mg/dL — ABNORMAL HIGH (ref 0.61–1.24)
GFR, EST AFRICAN AMERICAN: 15 mL/min — AB (ref >60)
GFR, EST NON AFRICAN AMERICAN: 13 mL/min — AB (ref >60)
Glucose, Bld: 131 mg/dL — ABNORMAL HIGH (ref 70–99)
POTASSIUM: 6.3 mmol/L — AB (ref 3.5–5.1)
Sodium: 131 mmol/L — ABNORMAL LOW (ref 135–145)

## 2014-08-24 LAB — I-STAT TROPONIN, ED: Troponin i, poc: 0 ng/mL (ref 0.00–0.08)

## 2014-08-24 LAB — CBC
HCT: 18.7 % — ABNORMAL LOW (ref 39.0–52.0)
Hemoglobin: 5.8 g/dL — CL (ref 13.0–17.0)
MCH: 23.7 pg — ABNORMAL LOW (ref 26.0–34.0)
MCHC: 31 g/dL (ref 30.0–36.0)
MCV: 76.3 fL — ABNORMAL LOW (ref 78.0–100.0)
PLATELETS: 554 10*3/uL — AB (ref 150–400)
RBC: 2.45 MIL/uL — ABNORMAL LOW (ref 4.22–5.81)
RDW: 22.2 % — ABNORMAL HIGH (ref 11.5–15.5)
WBC: 13 10*3/uL — ABNORMAL HIGH (ref 4.0–10.5)

## 2014-08-24 LAB — BRAIN NATRIURETIC PEPTIDE: B NATRIURETIC PEPTIDE 5: 122.7 pg/mL — AB (ref 0.0–100.0)

## 2014-08-24 MED ORDER — SODIUM BICARBONATE 8.4 % IV SOLN
50.0000 meq | Freq: Once | INTRAVENOUS | Status: AC
  Administered 2014-08-24: 50 meq via INTRAVENOUS
  Filled 2014-08-24: qty 50

## 2014-08-24 MED ORDER — INSULIN ASPART 100 UNIT/ML ~~LOC~~ SOLN
5.0000 [IU] | Freq: Once | SUBCUTANEOUS | Status: AC
  Administered 2014-08-24: 5 [IU] via INTRAVENOUS
  Filled 2014-08-24: qty 1

## 2014-08-24 MED ORDER — DEXTROSE 50 % IV SOLN
50.0000 mL | Freq: Once | INTRAVENOUS | Status: AC
  Administered 2014-08-24: 50 mL via INTRAVENOUS
  Filled 2014-08-24: qty 50

## 2014-08-24 MED ORDER — ALBUTEROL (5 MG/ML) CONTINUOUS INHALATION SOLN
10.0000 mg/h | INHALATION_SOLUTION | RESPIRATORY_TRACT | Status: DC
  Administered 2014-08-24: 10 mg/h via RESPIRATORY_TRACT
  Filled 2014-08-24: qty 20

## 2014-08-24 NOTE — ED Notes (Signed)
Pt here for SOB that started 2 hours ago. Pt on home o2. Denies chest pain. Denies cough.

## 2014-08-24 NOTE — ED Notes (Signed)
CRITICAL VALUE ALERT  Critical value received:  Potassium 6.3  Date of notification:  08/24/2014  Time of notification:  2055  Critical value read back:Yes.    Nurse who received alert:  MG Braulio Conte, RN  MD notified (1st page):  Dr. Ralene Bathe  Time of first page:  2055  MD notified (2nd page):  Time of second page:  Responding MD:  Dr. Ralene Bathe  Time MD responded:  2055

## 2014-08-24 NOTE — ED Notes (Signed)
The pt is c/o diff breathing since yesterday.  No feet or leg swelling.  He is on home 02.  Hx of a stroke.  The pt is c/o lt arm and shoulder pain for awhile.  He has a hx of gout.  Much pain  With lt arm movment

## 2014-08-24 NOTE — ED Notes (Signed)
Pt to ct 

## 2014-08-24 NOTE — ED Provider Notes (Signed)
CSN: [id number]     Arrival date & time 08/24/14  2003 History   First MD Initiated Contact with Patient 08/24/14 2142     Chief Complaint  Patient presents with  . Shortness of Breath     Patient is a 74 y.o. male presenting with shortness of breath. The history is provided by the patient and the spouse. No language interpreter was used.  Shortness of Breath  Roger Wood presents for evaluation of SOB.  He reports SOB for the last two weeks, worse today.  Breathing is worse with any activity.  He denies fever, cough, chest pain.  He is on 4L [place] at home.  He denies abdominal pain, bloody stools.  He has chronically black stool due to iron use - this is unchanged.    Past Medical History  Diagnosis Date  . Hypertension   . Gout   . Arthritis   . History of radiation therapy 01/27/11-03/26/11    prostate  . Refusal of blood transfusions as patient is Jehovah's Witness   . Cataract     b/l catartact surgery  . Anemia     takes iron supplements  . Prostate cancer 07/14/10    dx  . Diabetes mellitus     adult onset   . Type II diabetes mellitus with nephropathy     Patient denies  . Family history of adverse reaction to anesthesia     sister has difficulty waking up  . Dieulafoy ulcer    Past Surgical History  Procedure Laterality Date  . Esophagogastroduodenoscopy N/A 07/23/2014    Procedure: ESOPHAGOGASTRODUODENOSCOPY (EGD);  Surgeon: Gatha Mayer, MD;  Location: [facility] ENDOSCOPY;  Service: Endoscopy;  Laterality: N/A;   Family History  Problem Relation Age of Onset  . Breast cancer Sister   . Breast cancer Paternal Aunt   . Colon cancer Neg Hx   . Diabetes Mother   . Diabetes Sister   . Diabetes Paternal Aunt   . Heart disease Father   . Stroke Paternal Uncle   . Cancer Paternal Uncle     back   History  Substance Use Topics  . Smoking status: Former Smoker    Types: Cigarettes    Quit date: 04/24/1967  . Smokeless tobacco: Never Used  . Alcohol Use: No    Review of  Systems  Respiratory: Positive for shortness of breath.   All other systems reviewed and are negative.     Allergies  Asa  Home Medications   Prior to Admission medications   Medication Sig Start Date End Date Taking? Authorizing Provider  allopurinol (ZYLOPRIM) 100 MG tablet Take 100 mg by mouth. 06/16/14   Historical Provider, MD  amLODipine (NORVASC) 10 MG tablet Take 10 mg by mouth every morning.    Historical Provider, MD  cefdinir (OMNICEF) 300 MG capsule Take 1 capsule (300 mg total) by mouth 2 (two) times daily. 08/06/14   Velvet Bathe, MD  docusate sodium (COLACE) 100 MG capsule Take 1 capsule (100 mg total) by mouth 2 (two) times daily as needed for mild constipation. 07/29/14   Annita Brod, MD  ferrous sulfate 325 (65 FE) MG tablet Take 1 tablet (325 mg total) by mouth 3 (three) times daily with meals. 08/02/14   Velvet Bathe, MD  furosemide (LASIX) 40 MG tablet Take 40 mg by mouth daily as needed (For leg swelling.).    Historical Provider, MD  hydrALAZINE (APRESOLINE) 25 MG tablet Take 1 tablet (25 mg total) by  mouth 2 (two) times daily. 08/02/14   Velvet Bathe, MD  hydrocortisone (ANUSOL-HC) 2.5 % rectal cream Place rectally 3 (three) times daily. 08/06/14   Velvet Bathe, MD  metoprolol succinate (TOPROL-XL) 50 MG 24 hr tablet Take 50 mg by mouth daily. 07/02/14   Historical Provider, MD  omeprazole (PRILOSEC) 20 MG capsule Take 1 capsule (20 mg total) by mouth 2 (two) times daily before a meal. 07/29/14   Annita Brod, MD  pioglitazone (ACTOS) 15 MG tablet Take 1 tablet (15 mg total) by mouth daily. 07/29/14   Annita Brod, MD  pravastatin (PRAVACHOL) 40 MG tablet Take 40 mg by mouth. 04/13/13 04/13/17  Historical Provider, MD   BP 161/60 mmHg  Pulse 70  Temp(Src) 99 F (37.2 C) (Oral)  Resp 14  SpO2 100% Physical Exam  Constitutional: He appears well-developed and well-nourished.  HENT:  Head: Normocephalic and atraumatic.  Cardiovascular: Normal rate and  regular rhythm.   No murmur heard. Pulmonary/Chest: Effort normal and breath sounds normal. No respiratory distress.  Abdominal: Soft. There is no tenderness. There is no rebound and no guarding.  Musculoskeletal: He exhibits no edema.  Tenderness and swelling to left elbow, decreased ROM  Neurological: He is alert.  Disoriented to time, generalized weakness  Skin: Skin is warm and dry. There is pallor.  Psychiatric: He has a normal mood and affect. His behavior is normal.  Nursing note and vitals reviewed.   ED Course  Procedures (including critical care time) Labs Review Labs Reviewed  CBC - Abnormal; Notable for the following:    WBC 13.0 (*)    RBC 2.45 (*)    Hemoglobin 5.8 (*)    HCT 18.7 (*)    MCV 76.3 (*)    MCH 23.7 (*)    RDW 22.2 (*)    Platelets 554 (*)    All other components within normal limits  BASIC METABOLIC PANEL - Abnormal; Notable for the following:    Sodium 131 (*)    Potassium 6.3 (*)    Chloride 96 (*)    CO2 21 (*)    Glucose, Bld 131 (*)    BUN 45 (*)    Creatinine, Ser 4.13 (*)    GFR calc non Af Amer 13 (*)    GFR calc Af Amer 15 (*)    All other components within normal limits  BRAIN NATRIURETIC PEPTIDE - Abnormal; Notable for the following:    B Natriuretic Peptide 122.7 (*)    All other components within normal limits  URINE CULTURE  URINALYSIS, ROUTINE W REFLEX MICROSCOPIC  I-STAT TROPOININ, ED    Imaging Review Dg Chest 2 View  08/24/2014   CLINICAL DATA:  Shortness of breath and cough for 1 day  EXAM: CHEST  2 VIEW  COMPARISON:  August 04, 2014  FINDINGS: There is no edema or consolidation. Heart size and pulmonary vascularity are normal. No adenopathy. No bone lesions.  IMPRESSION: No edema or consolidation.   Electronically Signed   By: Lowella Grip III M.D.   On: 08/24/2014 20:47     EKG Interpretation None     ED ECG REPORT   Date: 08/25/2014  Rate: 71  Rhythm: normal sinus rhythm  QRS Axis: normal  Intervals:  normal  ST/T Wave abnormalities: normal  Conduction Disutrbances:none  Narrative Interpretation:    I have personally reviewed the EKG tracing and agree with the computerized printout as noted.  MDM   Final diagnoses:  Anemia, unspecified anemia  type  Hyperkalemia  CKD (chronic kidney disease), stage 4 (severe)  Dyspnea    Patient with history of GI bleeding, chronic kidney disease here for shortness of breath. He is a Sales promotion account executive Witness and will not receive any blood products. He's had progressive shortness of breath for the last week, difficulty with daily functioning. Patient does have hyperkalemia with his chronic kidney disease. Treated with temporizing measures. Hemoglobin is improved from prior hospital admission. Presentation is not consistent with acute PE, ACS, pulmonary edema, pneumonia. Discussed with medicine regarding admission for further management. Patient has some swelling and tenderness over the left elbow, question gouty arthritis. On initial evaluation patient confused and weak, CT head obtained. On repeat evaluation patient is more alert and interactive. Repeat evaluation is during patient's hour-long nebulizer treatment, question if he had some benefit from this medication from a respiratory standpoint despite lack of wheezing on exam.    Quintella Reichert, MD 08/25/14 0040

## 2014-08-24 NOTE — ED Notes (Signed)
CRITICAL VALUE ALERT  Critical value received:  Hgb 5.8  Date of notification:  08/24/2014  Time of notification:  2040  Critical value read back:Yes.    Nurse who received alert:  MG Braulio Conte, RN  MD notified (1st page):    Time of first page:  2040  MD notified (2nd page):  Time of second page:  Responding MD:  Dr. Ralene Bathe  Time MD responded:  2040

## 2014-08-24 NOTE — ED Notes (Signed)
Patient unable to void at the moment but is asking to attempt to use a urinal before cath. Urinal given to pt.

## 2014-08-25 ENCOUNTER — Other Ambulatory Visit: Payer: Medicare Other

## 2014-08-25 ENCOUNTER — Encounter (HOSPITAL_COMMUNITY): Payer: Self-pay | Admitting: Internal Medicine

## 2014-08-25 ENCOUNTER — Ambulatory Visit: Payer: Medicare Other

## 2014-08-25 ENCOUNTER — Inpatient Hospital Stay (HOSPITAL_COMMUNITY): Payer: Medicare Other

## 2014-08-25 DIAGNOSIS — Z923 Personal history of irradiation: Secondary | ICD-10-CM | POA: Diagnosis not present

## 2014-08-25 DIAGNOSIS — Z794 Long term (current) use of insulin: Secondary | ICD-10-CM | POA: Diagnosis not present

## 2014-08-25 DIAGNOSIS — M1990 Unspecified osteoarthritis, unspecified site: Secondary | ICD-10-CM | POA: Diagnosis present

## 2014-08-25 DIAGNOSIS — J9621 Acute and chronic respiratory failure with hypoxia: Secondary | ICD-10-CM | POA: Diagnosis present

## 2014-08-25 DIAGNOSIS — M109 Gout, unspecified: Secondary | ICD-10-CM | POA: Diagnosis present

## 2014-08-25 DIAGNOSIS — M1000 Idiopathic gout, unspecified site: Secondary | ICD-10-CM

## 2014-08-25 DIAGNOSIS — N179 Acute kidney failure, unspecified: Secondary | ICD-10-CM | POA: Insufficient documentation

## 2014-08-25 DIAGNOSIS — I10 Essential (primary) hypertension: Secondary | ICD-10-CM | POA: Diagnosis present

## 2014-08-25 DIAGNOSIS — E875 Hyperkalemia: Secondary | ICD-10-CM

## 2014-08-25 DIAGNOSIS — Z886 Allergy status to analgesic agent status: Secondary | ICD-10-CM | POA: Diagnosis not present

## 2014-08-25 DIAGNOSIS — D472 Monoclonal gammopathy: Secondary | ICD-10-CM | POA: Diagnosis present

## 2014-08-25 DIAGNOSIS — D638 Anemia in other chronic diseases classified elsewhere: Secondary | ICD-10-CM | POA: Diagnosis present

## 2014-08-25 DIAGNOSIS — I129 Hypertensive chronic kidney disease with stage 1 through stage 4 chronic kidney disease, or unspecified chronic kidney disease: Secondary | ICD-10-CM | POA: Diagnosis present

## 2014-08-25 DIAGNOSIS — N184 Chronic kidney disease, stage 4 (severe): Secondary | ICD-10-CM

## 2014-08-25 DIAGNOSIS — I5030 Unspecified diastolic (congestive) heart failure: Secondary | ICD-10-CM | POA: Diagnosis present

## 2014-08-25 DIAGNOSIS — R0602 Shortness of breath: Secondary | ICD-10-CM

## 2014-08-25 DIAGNOSIS — E1121 Type 2 diabetes mellitus with diabetic nephropathy: Secondary | ICD-10-CM | POA: Diagnosis present

## 2014-08-25 DIAGNOSIS — Z87891 Personal history of nicotine dependence: Secondary | ICD-10-CM | POA: Diagnosis not present

## 2014-08-25 DIAGNOSIS — Z8546 Personal history of malignant neoplasm of prostate: Secondary | ICD-10-CM | POA: Diagnosis not present

## 2014-08-25 DIAGNOSIS — Z993 Dependence on wheelchair: Secondary | ICD-10-CM | POA: Diagnosis not present

## 2014-08-25 DIAGNOSIS — R0600 Dyspnea, unspecified: Secondary | ICD-10-CM | POA: Insufficient documentation

## 2014-08-25 LAB — BASIC METABOLIC PANEL
ANION GAP: 13 (ref 5–15)
BUN: 47 mg/dL — ABNORMAL HIGH (ref 6–20)
CHLORIDE: 97 mmol/L — AB (ref 101–111)
CO2: 23 mmol/L (ref 22–32)
Calcium: 8.5 mg/dL — ABNORMAL LOW (ref 8.9–10.3)
Creatinine, Ser: 4.27 mg/dL — ABNORMAL HIGH (ref 0.61–1.24)
GFR calc Af Amer: 14 mL/min — ABNORMAL LOW (ref >60)
GFR calc non Af Amer: 12 mL/min — ABNORMAL LOW (ref >60)
Glucose, Bld: 173 mg/dL — ABNORMAL HIGH (ref 70–99)
POTASSIUM: 5 mmol/L (ref 3.5–5.1)
Sodium: 133 mmol/L — ABNORMAL LOW (ref 135–145)

## 2014-08-25 LAB — CBC
HCT: 16.5 % — ABNORMAL LOW (ref 39.0–52.0)
Hemoglobin: 5.1 g/dL — CL (ref 13.0–17.0)
MCH: 23.8 pg — ABNORMAL LOW (ref 26.0–34.0)
MCHC: 30.9 g/dL (ref 30.0–36.0)
MCV: 77.1 fL — AB (ref 78.0–100.0)
Platelets: 449 10*3/uL — ABNORMAL HIGH (ref 150–400)
RBC: 2.14 MIL/uL — AB (ref 4.22–5.81)
RDW: 22.2 % — ABNORMAL HIGH (ref 11.5–15.5)
WBC: 11.4 10*3/uL — AB (ref 4.0–10.5)

## 2014-08-25 LAB — GLUCOSE, CAPILLARY
GLUCOSE-CAPILLARY: 178 mg/dL — AB (ref 70–99)
GLUCOSE-CAPILLARY: 212 mg/dL — AB (ref 70–99)
Glucose-Capillary: 251 mg/dL — ABNORMAL HIGH (ref 70–99)
Glucose-Capillary: 218 mg/dL — ABNORMAL HIGH (ref 70–99)

## 2014-08-25 LAB — URINALYSIS, ROUTINE W REFLEX MICROSCOPIC
BILIRUBIN URINE: NEGATIVE
GLUCOSE, UA: NEGATIVE mg/dL
Hgb urine dipstick: NEGATIVE
Ketones, ur: NEGATIVE mg/dL
Leukocytes, UA: NEGATIVE
Nitrite: NEGATIVE
Protein, ur: 100 mg/dL — AB
SPECIFIC GRAVITY, URINE: 1.011 (ref 1.005–1.030)
Urobilinogen, UA: 1 mg/dL (ref 0.0–1.0)
pH: 7.5 (ref 5.0–8.0)

## 2014-08-25 LAB — URINE MICROSCOPIC-ADD ON

## 2014-08-25 MED ORDER — HYDROCORTISONE 2.5 % RE CREA
TOPICAL_CREAM | Freq: Three times a day (TID) | RECTAL | Status: DC
  Administered 2014-08-25 – 2014-08-26 (×5): via RECTAL
  Filled 2014-08-25: qty 28.35

## 2014-08-25 MED ORDER — PIOGLITAZONE HCL 15 MG PO TABS
15.0000 mg | ORAL_TABLET | Freq: Every day | ORAL | Status: DC
  Administered 2014-08-25 – 2014-08-26 (×2): 15 mg via ORAL
  Filled 2014-08-25 (×3): qty 1

## 2014-08-25 MED ORDER — FUROSEMIDE 40 MG PO TABS
40.0000 mg | ORAL_TABLET | Freq: Every day | ORAL | Status: DC | PRN
  Filled 2014-08-25: qty 1

## 2014-08-25 MED ORDER — PANTOPRAZOLE SODIUM 40 MG PO TBEC
40.0000 mg | DELAYED_RELEASE_TABLET | Freq: Two times a day (BID) | ORAL | Status: DC
  Administered 2014-08-25 – 2014-08-26 (×3): 40 mg via ORAL
  Filled 2014-08-25 (×3): qty 1

## 2014-08-25 MED ORDER — PRAVASTATIN SODIUM 40 MG PO TABS
40.0000 mg | ORAL_TABLET | Freq: Every day | ORAL | Status: DC
  Administered 2014-08-25: 40 mg via ORAL
  Filled 2014-08-25 (×2): qty 1

## 2014-08-25 MED ORDER — ACETAMINOPHEN 325 MG PO TABS: 650.0000 mg | ORAL_TABLET | Freq: Four times a day (QID) | ORAL | Status: DC | PRN

## 2014-08-25 MED ORDER — TECHNETIUM TC 99M DIETHYLENETRIAME-PENTAACETIC ACID: 40.0000 | Freq: Once | INTRAVENOUS | Status: AC | PRN

## 2014-08-25 MED ORDER — AMLODIPINE BESYLATE 10 MG PO TABS
10.0000 mg | ORAL_TABLET | Freq: Every morning | ORAL | Status: DC
  Administered 2014-08-25 – 2014-08-26 (×2): 10 mg via ORAL
  Filled 2014-08-25 (×2): qty 1

## 2014-08-25 MED ORDER — ONDANSETRON HCL 4 MG/2ML IJ SOLN: 4.0000 mg | Freq: Four times a day (QID) | INTRAMUSCULAR | Status: DC | PRN

## 2014-08-25 MED ORDER — HYDRALAZINE HCL 25 MG PO TABS
25.0000 mg | ORAL_TABLET | Freq: Two times a day (BID) | ORAL | Status: DC
Start: 2014-08-25 — End: 2014-08-26
  Administered 2014-08-25 – 2014-08-26 (×3): 25 mg via ORAL
  Filled 2014-08-25 (×4): qty 1

## 2014-08-25 MED ORDER — FERROUS SULFATE 325 (65 FE) MG PO TABS
325.0000 mg | ORAL_TABLET | Freq: Three times a day (TID) | ORAL | Status: DC
  Administered 2014-08-25 – 2014-08-26 (×5): 325 mg via ORAL
  Filled 2014-08-25 (×7): qty 1

## 2014-08-25 MED ORDER — METOPROLOL SUCCINATE ER 50 MG PO TB24
50.0000 mg | ORAL_TABLET | Freq: Every day | ORAL | Status: DC
  Administered 2014-08-25 – 2014-08-26 (×2): 50 mg via ORAL
  Filled 2014-08-25 (×2): qty 1

## 2014-08-25 MED ORDER — SODIUM CHLORIDE 0.9 % IJ SOLN
3.0000 mL | Freq: Two times a day (BID) | INTRAMUSCULAR | Status: DC
  Administered 2014-08-25 – 2014-08-26 (×3): 3 mL via INTRAVENOUS

## 2014-08-25 MED ORDER — INSULIN ASPART 100 UNIT/ML ~~LOC~~ SOLN
0.0000 [IU] | Freq: Three times a day (TID) | SUBCUTANEOUS | Status: DC
  Administered 2014-08-25: 5 [IU] via SUBCUTANEOUS
  Administered 2014-08-25: 2 [IU] via SUBCUTANEOUS
  Administered 2014-08-25: 3 [IU] via SUBCUTANEOUS
  Administered 2014-08-26: 2 [IU] via SUBCUTANEOUS
  Administered 2014-08-26: 3 [IU] via SUBCUTANEOUS

## 2014-08-25 MED ORDER — DOCUSATE SODIUM 100 MG PO CAPS: 100.0000 mg | ORAL_CAPSULE | Freq: Two times a day (BID) | ORAL | Status: DC | PRN

## 2014-08-25 MED ORDER — ONDANSETRON HCL 4 MG PO TABS: 4.0000 mg | ORAL_TABLET | Freq: Four times a day (QID) | ORAL | Status: DC | PRN

## 2014-08-25 MED ORDER — METHYLPREDNISOLONE SODIUM SUCC 40 MG IJ SOLR
20.0000 mg | Freq: Once | INTRAMUSCULAR | Status: AC
  Administered 2014-08-25: 20 mg via INTRAVENOUS
  Filled 2014-08-25: qty 0.5

## 2014-08-25 MED ORDER — SODIUM CHLORIDE 0.9 % IV SOLN
INTRAVENOUS | Status: DC
  Administered 2014-08-25: 16:00:00 via INTRAVENOUS

## 2014-08-25 MED ORDER — TECHNETIUM TO 99M ALBUMIN AGGREGATED
6.0000 | Freq: Once | INTRAVENOUS | Status: AC | PRN
  Administered 2014-08-25: 6 via INTRAVENOUS

## 2014-08-25 MED ORDER — ACETAMINOPHEN 650 MG RE SUPP: 650.0000 mg | Freq: Four times a day (QID) | RECTAL | Status: DC | PRN

## 2014-08-25 NOTE — H&P (Signed)
[facility] History and Physical  Roger Wood GYI:[id number] DOB: [birth year] DOA: 08/24/2014  Referring physician: Dr. Ralene Bathe. ER physician. PCP: Gearlean Alf., PA-C  Specialists: Dr. Carlean Purl. Gastroenterologist.  Chief Complaint: Shortness of breath.  HPI: Roger Wood is a 74 y.o. male who was recently admitted for acute respiratory failure in the setting of acute blood loss anemia secondary to GI bleed requiring clipping and was eventually discharged to rehabilitation and at this time is at home for last 1 week was brought to the ER after patient was complaining of shortness of breath last evening. Patient shortness of breath started last evening around 5 PM while at rest. Patient is usually wheelchair bound. Patient denies any chest pain and productive cough fever chills abdominal pain diarrhea. In the ER chest x-ray did not show anything acute. Labs revealed hypokalemia. Patient was given D50 and insulin bicarbonate. Patient has been admitted for further observation. Patient's hemoglobin at discharge was around 4 and at this time is around 5. Patient has not had any further episodes of GI bleed.   Review of Systems: As presented in the history of presenting illness, rest negative.  Past Medical History  Diagnosis Date  . Hypertension   . Gout   . Arthritis   . History of radiation therapy 01/27/11-03/26/11    prostate  . Refusal of blood transfusions as patient is Jehovah's Witness   . Cataract     b/l catartact surgery  . Anemia     takes iron supplements  . Prostate cancer 07/14/10    dx  . Diabetes mellitus     adult onset   . Type II diabetes mellitus with nephropathy     Patient denies  . Family history of adverse reaction to anesthesia     sister has difficulty waking up  . Dieulafoy ulcer    Past Surgical History  Procedure Laterality Date  . Esophagogastroduodenoscopy N/A 07/23/2014    Procedure: ESOPHAGOGASTRODUODENOSCOPY (EGD);  Surgeon: Gatha Mayer, MD;   Location: [facility] ENDOSCOPY;  Service: Endoscopy;  Laterality: N/A;   Social History:  reports that he quit smoking about 47 years ago. His smoking use included Cigarettes. He has never used smokeless tobacco. He reports that he does not drink alcohol or use illicit drugs. Where does patient live home. Can patient participate in ADLs? No.  Allergies  Allergen Reactions  . Asa [Aspirin]     Family History:  Family History  Problem Relation Age of Onset  . Breast cancer Sister   . Breast cancer Paternal Aunt   . Colon cancer Neg Hx   . Diabetes Mother   . Diabetes Sister   . Diabetes Paternal Aunt   . Heart disease Father   . Stroke Paternal Uncle   . Cancer Paternal Uncle     back      Prior to Admission medications   Medication Sig Start Date End Date Taking? Authorizing Provider  allopurinol (ZYLOPRIM) 100 MG tablet Take 100 mg by mouth. 06/16/14   Historical Provider, MD  amLODipine (NORVASC) 10 MG tablet Take 10 mg by mouth every morning.    Historical Provider, MD  cefdinir (OMNICEF) 300 MG capsule Take 1 capsule (300 mg total) by mouth 2 (two) times daily. 08/06/14   Velvet Bathe, MD  docusate sodium (COLACE) 100 MG capsule Take 1 capsule (100 mg total) by mouth 2 (two) times daily as needed for mild constipation. 07/29/14   Annita Brod, MD  ferrous sulfate 325 (65 FE)  MG tablet Take 1 tablet (325 mg total) by mouth 3 (three) times daily with meals. 08/02/14   Velvet Bathe, MD  furosemide (LASIX) 40 MG tablet Take 40 mg by mouth daily as needed (For leg swelling.).    Historical Provider, MD  hydrALAZINE (APRESOLINE) 25 MG tablet Take 1 tablet (25 mg total) by mouth 2 (two) times daily. 08/02/14   Velvet Bathe, MD  hydrocortisone (ANUSOL-HC) 2.5 % rectal cream Place rectally 3 (three) times daily. 08/06/14   Velvet Bathe, MD  metoprolol succinate (TOPROL-XL) 50 MG 24 hr tablet Take 50 mg by mouth daily. 07/02/14   Historical Provider, MD  omeprazole (PRILOSEC) 20 MG capsule Take 1  capsule (20 mg total) by mouth 2 (two) times daily before a meal. 07/29/14   Annita Brod, MD  pioglitazone (ACTOS) 15 MG tablet Take 1 tablet (15 mg total) by mouth daily. 07/29/14   Annita Brod, MD  pravastatin (PRAVACHOL) 40 MG tablet Take 40 mg by mouth. 04/13/13 04/13/17  Historical Provider, MD    Physical Exam: Filed Vitals:   08/25/14 0230 08/25/14 0245 08/25/14 0300 08/25/14 0332  BP: 134/61 146/68 134/51 139/55  Pulse: 90 87 86 84  Temp:    98.7 F (37.1 C)  TempSrc:    Oral  Resp: 16 11 10 7   Weight:    97.6 kg (215 lb 2.7 oz)  SpO2: 100% 99% 100% 100%     General:  Moderately built and nourished.  Eyes: Anicteric mild pallor.  ENT: No discharge from the ears eyes nose and mouth.  Neck: No mass felt.  Cardiovascular: S1 and S2 heard.  Respiratory: No rhonchi or crepitations.  Abdomen: Soft nontender bowel sounds present.  Skin: No rash.  Musculoskeletal: Patient has pain on his left elbow on extending his arm.  Psychiatric: Appears normal.  Neurologic: Alert awake oriented to time place and person.  Labs on Admission:  Basic Metabolic Panel:  Recent Labs Lab 08/24/14 2015  NA 131*  K 6.3*  CL 96*  CO2 21*  GLUCOSE 131*  BUN 45*  CREATININE 4.13*  CALCIUM 9.0   Liver Function Tests: No results for input(s): AST, ALT, ALKPHOS, BILITOT, PROT, ALBUMIN in the last 168 hours. No results for input(s): LIPASE, AMYLASE in the last 168 hours. No results for input(s): AMMONIA in the last 168 hours. CBC:  Recent Labs Lab 08/24/14 2015  WBC 13.0*  HGB 5.8*  HCT 18.7*  MCV 76.3*  PLT 554*   Cardiac Enzymes: No results for input(s): CKTOTAL, CKMB, CKMBINDEX, TROPONINI in the last 168 hours.  BNP (last 3 results)  Recent Labs  07/28/14 1404 08/24/14 2015  BNP 248.6* 122.7*    ProBNP (last 3 results)  Recent Labs  04/09/14 1212  PROBNP 537.6*    CBG: No results for input(s): GLUCAP in the last 168 hours.  Radiological  Exams on Admission: Dg Chest 2 View  08/24/2014   CLINICAL DATA:  Shortness of breath and cough for 1 day  EXAM: CHEST  2 VIEW  COMPARISON:  August 04, 2014  FINDINGS: There is no edema or consolidation. Heart size and pulmonary vascularity are normal. No adenopathy. No bone lesions.  IMPRESSION: No edema or consolidation.   Electronically Signed   By: Lowella Grip III M.D.   On: 08/24/2014 20:47   Dg Elbow 2 Views Left  08/24/2014   CLINICAL DATA:  Left elbow pain for 3-4 weeks  EXAM: LEFT ELBOW - 2 VIEW  COMPARISON:  None.  FINDINGS: No evidence of acute fracture of the elbow. No joint effusion. There is loss of joint space in the olecranon fossa.  IMPRESSION: 1. No acute fracture. 2. Significant Arthropathy of the elbow joint.   Electronically Signed   By: Suzy Bouchard M.D.   On: 08/24/2014 22:45   Ct Head Wo Contrast  08/24/2014   CLINICAL DATA:  Dyspnea since yesterday.  EXAM: CT HEAD WITHOUT CONTRAST  TECHNIQUE: Contiguous axial images were obtained from the base of the skull through the vertex without intravenous contrast.  COMPARISON:  04/10/2014  FINDINGS: There is no intracranial hemorrhage, mass or evidence of acute infarction. There is mild generalized atrophy. There is mild chronic microvascular ischemic change. There is no significant extra-axial fluid collection.  No acute intracranial findings are evident.  IMPRESSION: Mild atrophy and chronic microvascular changes.  No acute findings.   Electronically Signed   By: Andreas Newport M.D.   On: 08/24/2014 23:14    EKG: Independently reviewed. Normal sinus rhythm.  Assessment/Plan Principal Problem:   SOB (shortness of breath) Active Problems:   CKD (chronic kidney disease)   Hyperkalemia   Essential hypertension   Gout flare   1. Shortness of breath/acute respiratory failure - patient at this time is not in distress. Cause is not clear. Chest x-ray is unremarkable. Patient's hemoglobin is actually improved from recent. I  have ordered Dopplers of the lower extremity to rule out any DVT. Possible need VQ scan. Note that patient has severe anemia and is Jehovah's Witness and will refuse blood transfusion may need a breast check blood tests. 2. Hyperkalemia in a patient with chronic kidney disease stage III - patient has received D50 IV insulin and bicarbonate. I'm repeating metabolic panel. Continue Lasix. Nutritional consult for diet. 3. Left elbow gout - patient used to be on allopurinol which was discontinued recently. Will try one small dose of IV Solu-Medrol. 4. Severe anemia with recent GI bleed requiring clipping for Diuelfoy's lesion - follow CBC. On Protonix. 5. Diastolic CHF - continue Lasix. Last evening measured was in December 2015 was 60-65% with grade 2 diastolic dysfunction. 6. Diabetes mellitus type 2 on Actos closely follow CBGs and sliding scale coverage. 7. Hypertension - continue present medications.  Note that patient is Jehovah's Witness and does not allow any blood transfusion.  I have reviewed patient's charts and recent tests done.   DVT Prophylaxis SCDs.  Code Status: Full code.  Family Communication: Patient's wife at the bedside.  Disposition Plan: Admit to inpatient.    Kimbella Heisler N. [facility] Pager [phone].  If 7PM-7AM, please contact night-coverage [email or web address] Password [email or web address] 08/25/2014, 4:24 AM

## 2014-08-25 NOTE — Plan of Care (Signed)
Problem: Food- and Nutrition-Related Knowledge Deficit (NB-1.1) Goal: Nutrition education Formal process to instruct or train a patient/client in a skill or to impart knowledge to help patients/clients voluntarily manage or modify food choices and eating behavior to maintain or improve health. Outcome: Completed/Met Date Met:  08/25/14  RD consulted for diet education for CKD.  Pt currently in [place].  Wife present in patient's room.  This RD provided "Food Pyramid for Healthy Eating with Kidney Disease" handout.  Reviewed foods to limit/avoid that are high potassium, sodium, and phosphorus.  Also reviewed "renal-friendly" beverage options. Teach back method used.  Expect good compliance.  Body mass index is 30.02 kg/(m^2). Pt meets criteria for Obesity Class I based on current BMI.  Current diet order is Heart Healthy/Carbohydrate Modified.  Patient ate well at lunch; consumed 100% per wife.  Labs and medications reviewed. No further nutrition interventions warranted at this time.   If additional nutrition issues arise, please re-consult RD.  Arthur Holms, RD, LDN Pager #: [phone] After-Hours Pager #: [phone]

## 2014-08-25 NOTE — Progress Notes (Signed)
[facility] Progress Note   Roger Wood HYQ:[id number] DOB: 1940/11/04 DOA: 08/24/2014 PCP: Gearlean Alf., PA-C  Brief narrative: Roger Wood is a 74 y.o. male with past medical history of hypertension, CKD 4, MGUS, prostate cancer status post radiation, diabetes mellitus who was recently admitted for anemia and GI bleed. EGD revealed a bleeding lesion which was clipped and treated with epinephrine. 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 He has been receiving iron and Aranesp by hematology as outpatient.    Subjective: Evaluated this AM. He has no complaints of dyspnea. Appears slightly confused and tells me that he is not using oxygen at home. His wife who is at the bedside confirms that he was sent home with oxygen when last discharged and was using it at home. Despite using O2, he was becoming more progressively short of breath. He has had a mild dry cough as well.   Assessment/Plan: Principal Problem:   SOB (shortness of breath)/ dyspnea on exertion- chronic respiratory failure - Discharged on 4/7 with oxygen-hypoxia and dyspnea on exertion at that time thought to be secondary to severe anemia-as he is a Jehovah's Witness blood could not be replaced. 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 - per wife he tends to get disoriented when he is in the hospital    Essential hypertension -Continue hydralazine & metoprolol    Diabetes mellitus -Continue current dosages of insulin  Code Status: full code Family Communication: wife at bedside Disposition Plan: home when stable DVT prophylaxis: SCDs Consultants: Procedures:  Antibiotics: Anti-infectives    None      Objective: Filed Weights   08/25/14 0332  Weight: 97.6 kg (215 lb 2.7 oz)    Intake/Output Summary (Last 24 hours) at 08/25/14 1306 Last data filed at 08/25/14 0859  Gross per 24 hour  Intake      0 ml  Output    600 ml  Net   -600 ml     Vitals Filed Vitals:   08/25/14 0900 08/25/14 1000 08/25/14 1100 08/25/14 1200  BP: 136/52 156/54 138/46 138/46  Pulse: 69 72 64 69  Temp:    98.4 F (36.9 C)  TempSrc:    Oral  Resp: 19 8 17 17   Weight:      SpO2: 100% 100% 100% 100%    Exam:  General:  Pt is alert, not in acute distress  HEENT: No icterus, No thrush  Cardiovascular: regular rate and rhythm, S1/S2 No murmur  Respiratory: clear to  auscultation bilaterally   Abdomen: Soft, +Bowel sounds, non tender, non distended, no guarding  MSK: No LE edema, cyanosis or clubbing  Data Reviewed: Basic Metabolic Panel:  Recent Labs Lab 08/24/14 2015 08/25/14 0554  NA 131* 133*  K 6.3* 5.0  CL 96* 97*  CO2 21* 23  GLUCOSE 131* 173*  BUN 45* 47*  CREATININE 4.13* 4.27*  CALCIUM 9.0 8.5*   Liver Function Tests: No results for input(s): AST, ALT, ALKPHOS, BILITOT, PROT, ALBUMIN in the last 168 hours. No results for input(s): LIPASE, AMYLASE in the last 168 hours. No results for input(s): AMMONIA in the last 168 hours. CBC:  Recent Labs Lab 08/24/14 2015 08/25/14 0554  WBC 13.0* 11.4*  HGB  5.8* 5.1*  HCT 18.7* 16.5*  MCV 76.3* 77.1*  PLT 554* 449*   Cardiac Enzymes: No results for input(s): CKTOTAL, CKMB, CKMBINDEX, TROPONINI in the last 168 hours. BNP (last 3 results)  Recent Labs  07/28/14 1404 08/24/14 2015  BNP 248.6* 122.7*    ProBNP (last 3 results)  Recent Labs  04/09/14 1212  PROBNP 537.6*    CBG:  Recent Labs Lab 08/25/14 0935  GLUCAP 178*    No results found for this or any previous visit (from the past 240 hour(s)).   Studies: Dg Chest 2 View  08/24/2014   CLINICAL DATA:  Shortness of breath and cough for 1 day  EXAM: CHEST  2 VIEW  COMPARISON:  August 04, 2014  FINDINGS: There is no edema or consolidation. Heart size and pulmonary vascularity are normal. No adenopathy. No bone lesions.  IMPRESSION: No edema or consolidation.   Electronically Signed   By: Lowella Grip III M.D.   On: 08/24/2014 20:47   Dg Elbow 2 Views Left  08/24/2014   CLINICAL DATA:  Left elbow pain for 3-4 weeks  EXAM: LEFT ELBOW - 2 VIEW  COMPARISON:  None.  FINDINGS: No evidence of acute fracture of the elbow. No joint effusion. There is loss of joint space in the olecranon fossa.  IMPRESSION: 1. No acute fracture. 2. Significant Arthropathy of the elbow joint.   Electronically Signed   By: Suzy Bouchard M.D.   On: 08/24/2014 22:45   Ct Head Wo Contrast  08/24/2014   CLINICAL DATA:  Dyspnea since yesterday.  EXAM: CT HEAD WITHOUT CONTRAST  TECHNIQUE: Contiguous axial images were obtained from the base of the skull through the vertex without intravenous contrast.  COMPARISON:  04/10/2014  FINDINGS: There is no intracranial hemorrhage, mass or evidence of acute infarction. There is mild generalized atrophy. There is mild chronic microvascular ischemic change. There is no significant extra-axial fluid collection.  No acute intracranial findings are evident.  IMPRESSION: Mild atrophy and chronic microvascular changes.  No acute findings.   Electronically Signed   By: Andreas Newport M.D.   On: 08/24/2014 23:14    Scheduled Meds:  Scheduled Meds: . amLODipine  10 mg Oral q morning - 10a  . ferrous sulfate  325 mg Oral TID WC  . hydrALAZINE  25 mg Oral BID  . hydrocortisone   Rectal TID  . insulin aspart  0-9 Units Subcutaneous TID WC  . metoprolol succinate  50 mg Oral Daily  . pantoprazole  40 mg Oral BID  . pioglitazone  15 mg Oral Q breakfast  . pravastatin  40 mg Oral q1800  . sodium chloride  3 mL Intravenous Q12H   Continuous Infusions:   Time spent on care of this patient:  23 min   [place], MD 08/25/2014, 1:06 PM  LOS: 0 days   [facility] Office  [phone] Pager - Text Page per [email or web address]  If 7PM-7AM, please contact night-coverage [email or web address]

## 2014-08-26 ENCOUNTER — Inpatient Hospital Stay (HOSPITAL_COMMUNITY): Payer: Medicare Other

## 2014-08-26 LAB — BASIC METABOLIC PANEL
ANION GAP: 9 (ref 5–15)
Anion gap: 11 (ref 5–15)
Anion gap: 12 (ref 5–15)
BUN: 53 mg/dL — ABNORMAL HIGH (ref 6–20)
BUN: 57 mg/dL — ABNORMAL HIGH (ref 6–20)
BUN: 55 mg/dL — ABNORMAL HIGH (ref 6–20)
CALCIUM: 8.7 mg/dL — AB (ref 8.9–10.3)
CO2: 21 mmol/L — ABNORMAL LOW (ref 22–32)
CO2: 23 mmol/L (ref 22–32)
CO2: 22 mmol/L (ref 22–32)
Calcium: 8.5 mg/dL — ABNORMAL LOW (ref 8.9–10.3)
Calcium: 8.4 mg/dL — ABNORMAL LOW (ref 8.9–10.3)
Chloride: 99 mmol/L — ABNORMAL LOW (ref 101–111)
Chloride: 98 mmol/L — ABNORMAL LOW (ref 101–111)
Chloride: 100 mmol/L — ABNORMAL LOW (ref 101–111)
Creatinine, Ser: 4 mg/dL — ABNORMAL HIGH (ref 0.61–1.24)
Creatinine, Ser: 3.86 mg/dL — ABNORMAL HIGH (ref 0.61–1.24)
Creatinine, Ser: 3.55 mg/dL — ABNORMAL HIGH (ref 0.61–1.24)
GFR calc Af Amer: 16 mL/min — ABNORMAL LOW (ref >60)
GFR calc Af Amer: 18 mL/min — ABNORMAL LOW (ref >60)
GFR calc non Af Amer: 15 mL/min — ABNORMAL LOW (ref >60)
GFR, EST AFRICAN AMERICAN: 16 mL/min — AB (ref >60)
GFR, EST NON AFRICAN AMERICAN: 13 mL/min — AB (ref >60)
GFR, EST NON AFRICAN AMERICAN: 14 mL/min — AB (ref >60)
GLUCOSE: 175 mg/dL — AB (ref 70–99)
Glucose, Bld: 242 mg/dL — ABNORMAL HIGH (ref 70–99)
Glucose, Bld: 245 mg/dL — ABNORMAL HIGH (ref 70–99)
POTASSIUM: 6.3 mmol/L — AB (ref 3.5–5.1)
POTASSIUM: 4.8 mmol/L (ref 3.5–5.1)
Potassium: 5.3 mmol/L — ABNORMAL HIGH (ref 3.5–5.1)
SODIUM: 129 mmol/L — AB (ref 135–145)
SODIUM: 132 mmol/L — AB (ref 135–145)
Sodium: 134 mmol/L — ABNORMAL LOW (ref 135–145)

## 2014-08-26 LAB — URINE CULTURE
COLONY COUNT: NO GROWTH
Culture: NO GROWTH

## 2014-08-26 LAB — GLUCOSE, CAPILLARY
GLUCOSE-CAPILLARY: 235 mg/dL — AB (ref 70–99)
GLUCOSE-CAPILLARY: 156 mg/dL — AB (ref 70–99)
GLUCOSE-CAPILLARY: 151 mg/dL — AB (ref 70–99)

## 2014-08-26 MED ORDER — SODIUM POLYSTYRENE SULFONATE 15 GM/60ML PO SUSP
30.0000 g | Freq: Once | ORAL | Status: AC
  Administered 2014-08-26: 30 g via ORAL
  Filled 2014-08-26: qty 120

## 2014-08-26 MED ORDER — BISACODYL 10 MG RE SUPP
10.0000 mg | Freq: Once | RECTAL | Status: AC
  Administered 2014-08-26: 10 mg via RECTAL
  Filled 2014-08-26: qty 1

## 2014-08-26 MED ORDER — INSULIN ASPART 100 UNIT/ML ~~LOC~~ SOLN
5.0000 [IU] | Freq: Once | SUBCUTANEOUS | Status: AC
  Administered 2014-08-26: 5 [IU] via INTRAVENOUS

## 2014-08-26 MED ORDER — DEXTROSE 50 % IV SOLN
50.0000 mL | Freq: Once | INTRAVENOUS | Status: AC
  Administered 2014-08-26: 50 mL via INTRAVENOUS
  Filled 2014-08-26: qty 50

## 2014-08-26 MED ORDER — SODIUM BICARBONATE 8.4 % IV SOLN
50.0000 meq | Freq: Once | INTRAVENOUS | Status: AC
Start: 2014-08-26 — End: 2014-08-26
  Administered 2014-08-26: 50 meq via INTRAVENOUS
  Filled 2014-08-26: qty 50

## 2014-08-26 NOTE — Progress Notes (Signed)
Chaplain responded to page for spiritual care consult. Chaplain facilitated completion of advanced directive. Copy placed in pt chart and pt nurse informed. Page chaplain as needed.    08/26/14 1400  Clinical Encounter Type  Visited With Patient and family together  Visit Type Initial;Spiritual support  Referral From Social work  Spiritual Encounters  Spiritual Needs Emotional  Stress Factors  Patient Stress Factors Major life changes  Family Stress Factors Major life changes  Advance Directives (For Healthcare)  Does patient have an advance directive? Yes  Type of Paramedic of [place];Living will  Copy of advanced directive(s) in chart? Yes  StameyBarbette Hair, Chaplain 08/26/2014 2:41 PM

## 2014-08-26 NOTE — Progress Notes (Signed)
CRITICAL VALUE ALERT  Critical value received:  K+ 6.3  Date of notification:  08/26/14  Time of notification:  0400  Critical value read back:Yes.    Nurse who received alert:  Alessandra Grout, RN  MD notified (1st page):  Lamar Blinks, NP  Time of first page:  0403  MD notified (2nd page):  Time of second page:  Responding MD:  Lamar Blinks, NP  Time MD responded:  [id number]

## 2014-08-26 NOTE — Discharge Summary (Signed)
Physician Discharge Summary  Roger Wood VFI:[id number] DOB: 12-29-1940 DOA: 08/24/2014  PCP: Worthy Keeler C., PA-C  Admit date: 08/24/2014 Discharge date: 08/26/2014  Time spent: 50 minutes  Recommendations for Outpatient Follow-up:  1. F/u Hb regularly  Discharge Condition: stable Diet recommendation: carb modified, heart healthy, low sodium  Discharge Diagnoses:  Principal Problem:   SOB / hypoxia Active Problems:    AKI (acute kidney injury)- CKD 4   Hyperkalemia   Essential hypertension   Gout flare     History of present illness:  Roger Wood is a 74 y.o. male with past medical history of hypertension, CKD 4, MGUS, prostate cancer status post radiation, diabetes mellitus who was recently admitted for anemia and GI bleed. EGD revealed a bleeding lesion which was clipped and treated with epinephrine. 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 He has been receiving iron and Aranesp by hematology as outpatient.   Hospital Course:  Principal Problem:  SOB (shortness of breath)/ dyspnea on exertion- chronic respiratory failure - Discharged on 4/7 with oxygen-hypoxia and dyspnea on exertion at that time thought to be secondary to severe anemia-as he is a Jehovah's Witness blood could not be replaced. -Chest x-ray on admission is clear -Lungs clear on exam - VQ scan negative- CT scan of chest without contrast does not reveal any pulmonary pathology - hypoxia and dyspnea is secondary to severe anemia, deconditioning and possibly also obesity hypoventilation syndrome - currently pulse ox of 100% on 4 L O2   Active Problems:  AKI on CKD 4 (chronic kidney disease) -Creatinine slightly elevated from baseline which appears to be 3 - He has Lasix at home but he is not able to tell me if he has been taking it or not -His wife who is at bedside is not sure if he has been taking it either -Cr improved from 4.27 to 3.55 with holding Lasix and slowly  hydrating  Anemia of chronic disease secondary to CK D3 and MGUS - Also Secondary to blood loss in late March with hematemesis -[facility] - due to being a [place] Witness he will not take blood transfusions   Hyperkalemia -Likely secondary to acute renal failure and resolved with treatment  Confusion/ h/o underlying dementia? - per wife he tends to get disoriented when he is in the hospital   Essential hypertension -Continue hydralazine & metoprolol   Diabetes mellitus -Continue current dosages of insulin   Discharge Exam: Filed Weights   08/25/14 0332 08/25/14 2000 08/26/14 0500  Weight: 97.6 kg (215 lb 2.7 oz) 97.8 kg (215 lb 9.8 oz) 100.4 kg (221 lb 5.5 oz)   Filed Vitals:   08/26/14 1600  BP: 159/54  Pulse: 66  Temp:   Resp: 10    General: AAO x 3, no distress Cardiovascular: RRR, no murmurs  Respiratory: clear to auscultation bilaterally GI: soft, non-tender, non-distended, bowel sound positive  Discharge Instructions You were cared for by a hospitalist during your hospital stay. If you have any questions about your discharge medications or the care you received while you were in the hospital after you are discharged, you can call the unit and asked to speak with the hospitalist on call if the hospitalist that took care of you is not available. Once you are discharged, your primary care physician will handle any further medical issues. Please note that NO REFILLS for any discharge medications will be authorized  once you are discharged, as it is imperative that you return to your primary care physician (or establish a relationship with a primary care physician if you do not have one) for your aftercare needs so that they can reassess your need for medications and monitor your lab values.      Discharge Instructions    Discharge instructions    Complete by:  As directed   Low sodium, heart healthy, diabetic  diet     Increase activity slowly    Complete by:  As directed             Medication List    TAKE these medications        albuterol 108 (90 BASE) MCG/ACT inhaler  Commonly known as:  PROVENTIL HFA;VENTOLIN HFA  Inhale 1-2 puffs into the lungs every 6 (six) hours as needed for wheezing or shortness of breath.     docusate sodium 100 MG capsule  Commonly known as:  COLACE  Take 1 capsule (100 mg total) by mouth 2 (two) times daily as needed for mild constipation.     ferrous sulfate 325 (65 FE) MG tablet  Take 1 tablet (325 mg total) by mouth 3 (three) times daily with meals.     furosemide 40 MG tablet  Commonly known as:  LASIX  Take 40 mg by mouth daily.     HUMALOG KWIKPEN 100 UNIT/ML KiwkPen  Generic drug:  insulin lispro  Inject 2-6 Units into the skin daily. Uses a sliding scale     hydrALAZINE 25 MG tablet  Commonly known as:  APRESOLINE  Take 1 tablet (25 mg total) by mouth 2 (two) times daily.     hydrocortisone 2.5 % rectal cream  Commonly known as:  ANUSOL-HC  Place rectally 3 (three) times daily.     metoprolol succinate 50 MG 24 hr tablet  Commonly known as:  TOPROL-XL  Take 50 mg by mouth daily.     omeprazole 20 MG capsule  Commonly known as:  PRILOSEC  Take 1 capsule (20 mg total) by mouth 2 (two) times daily before a meal.     pioglitazone 15 MG tablet  Commonly known as:  ACTOS  Take 1 tablet (15 mg total) by mouth daily.       Allergies  Allergen Reactions  . Asa [Aspirin]     Stomach bleeding      The results of significant diagnostics from this hospitalization (including imaging, microbiology, ancillary and laboratory) are listed below for reference.    Significant Diagnostic Studies: Dg Chest 2 View  08/24/2014   CLINICAL DATA:  Shortness of breath and cough for 1 day  EXAM: CHEST  2 VIEW  COMPARISON:  August 04, 2014  FINDINGS: There is no edema or consolidation. Heart size and pulmonary vascularity are normal. No adenopathy. No  bone lesions.  IMPRESSION: No edema or consolidation.   Electronically Signed   By: Lowella Grip III M.D.   On: 08/24/2014 20:47   Dg Chest 2 View  07/28/2014   CLINICAL DATA:  Shortness of breath.  EXAM: CHEST  2 VIEW  COMPARISON:  04/11/2014  FINDINGS: Lungs are hypoinflated with mild prominence of the perihilar markings. Linear density over the left upper lobe likely atelectasis and less likely infection. Posterior left basilar opacification likely layering effusion and associated atelectasis, although cannot exclude infection. Mild stable cardiomegaly. Remainder the exam is unchanged.  IMPRESSION: Findings likely representing mild CHF with left-sided effusion and left upper lobe and  basilar atelectasis. Cannot exclude infection in the left base.   Electronically Signed   By: Marin Olp M.D.   On: 07/28/2014 10:30   Dg Elbow 2 Views Left  08/24/2014   CLINICAL DATA:  Left elbow pain for 3-4 weeks  EXAM: LEFT ELBOW - 2 VIEW  COMPARISON:  None.  FINDINGS: No evidence of acute fracture of the elbow. No joint effusion. There is loss of joint space in the olecranon fossa.  IMPRESSION: 1. No acute fracture. 2. Significant Arthropathy of the elbow joint.   Electronically Signed   By: Suzy Bouchard M.D.   On: 08/24/2014 22:45   Ct Head Wo Contrast  08/24/2014   CLINICAL DATA:  Dyspnea since yesterday.  EXAM: CT HEAD WITHOUT CONTRAST  TECHNIQUE: Contiguous axial images were obtained from the base of the skull through the vertex without intravenous contrast.  COMPARISON:  04/10/2014  FINDINGS: There is no intracranial hemorrhage, mass or evidence of acute infarction. There is mild generalized atrophy. There is mild chronic microvascular ischemic change. There is no significant extra-axial fluid collection.  No acute intracranial findings are evident.  IMPRESSION: Mild atrophy and chronic microvascular changes.  No acute findings.   Electronically Signed   By: Andreas Newport M.D.   On: 08/24/2014 23:14    Ct Chest Wo Contrast  08/26/2014   CLINICAL DATA:  Acute respiratory failure with hypoxia. Shortness of breath.  EXAM: CT CHEST WITHOUT CONTRAST  TECHNIQUE: Multidetector CT imaging of the chest was performed following the standard protocol without IV contrast.  COMPARISON:  Chest radiographs 08/24/2014. Thoracic spine CT 08/09/2010.  FINDINGS: Bilateral gynecomastia is incidentally noted. Visualized portion of the thyroid appears mildly enlarged diffusely without a dominant nodule or mass. A borderline enlarged right paratracheal lymph node in the upper mediastinum measures 10 mm in short axis. No enlarged hilar lymph nodes are identified on this unenhanced study. Mild LAD and left circumflex coronary artery calcification is present. Decreased attenuation of the blood pool is consistent with known anemia.  There is no pleural or pericardial effusion. There is a 3 mm calcified granuloma in the right upper lobe (series 3, image 17). Minimal basilar scarring or atelectasis is noted in the right middle lobe, lingula, and left lower lobe.  Small stones are partially visualized in the gallbladder. An endoscopic clip is noted in the proximal stomach. Mild thoracic spondylosis is noted.  IMPRESSION: 1. No acute abnormality identified in the chest. 2. Cholelithiasis.   Electronically Signed   By: Logan Bores   On: 08/26/2014 08:26   Nm Pulmonary Perf And Vent  08/25/2014   CLINICAL DATA:  Acute respiratory failure with hypoxia, shortness of breath, dyspnea on exertion, history hypertension, gout, prostate cancer, type 2 diabetes, former smoker  EXAM: NUCLEAR MEDICINE VENTILATION - PERFUSION LUNG SCAN  TECHNIQUE: Ventilation images were obtained in multiple projections using inhaled aerosol technetium 99 M DTPA. Perfusion images were obtained in multiple projections after intravenous injection of Tc-50m MAA.  RADIOPHARMACEUTICALS:  42 mCi Technetium-82m DTPA aerosol inhalation and 6.3 mCi Technetium-70m MAA IV   COMPARISON:  None.  FINDINGS: Ventilation: Single small subsegmental ventilation defect at LEFT upper lobe seen only on LPO view. Swallowed aerosol within stomach.  Perfusion: Single small subsegmental perfusion defect in LEFT upper lobe seen only on LPO view, matching ventilatory finding. Possibility that this represents an artifact is raised. No other segmental or subsegmental perfusion defects identified.  Chest radiograph:  Upper normal heart size.  Clear lungs.  IMPRESSION: Matching  small subsegmental perfusion and ventilation defects at the LEFT upper lobe, seen only on LPO views, cannot exclude artifact.  Findings represent a very low probability for pulmonary embolism.   Electronically Signed   By: Lavonia Dana M.D.   On: 08/25/2014 16:07   Dg Chest Port 1 View  08/04/2014   CLINICAL DATA:  Fever of unknown origin for 2 days.  EXAM: PORTABLE CHEST - 1 VIEW  COMPARISON:  07/28/2014  FINDINGS: The heart is enlarged but stable. There is central vascular congestion and probable interstitial edema. Possible small left effusion and left basilar atelectasis. Stable linear band of left upper lobe atelectasis. The bony thorax is grossly intact.  IMPRESSION: Persistent or recurrent CHF with probable left effusion and left lower lobe atelectasis.  Stable discoid atelectasis in the left upper lobe   Electronically Signed   By: Marijo Sanes M.D.   On: 08/04/2014 18:26    Microbiology: Recent Results (from the past 240 hour(s))  Urine culture     Status: None   Collection Time: 08/24/14 11:52 PM  Result Value Ref Range Status   Specimen Description URINE, CATHETERIZED  Final   Special Requests NONE  Final   Colony Count NO GROWTH Performed at [facility]   Final   Culture NO GROWTH Performed at [facility]   Final   Report Status 08/26/2014 FINAL  Final     Labs: Basic Metabolic Panel:  Recent Labs Lab 08/24/14 2015 08/25/14 0554 08/26/14 0255 08/26/14 0732 08/26/14 1445   NA 131* 133* 129* 132* 134*  K 6.3* 5.0 6.3* 5.3* 4.8  CL 96* 97* 99* 98* 100*  CO2 21* 23 21* 23 22  GLUCOSE 131* 173* 242* 245* 175*  BUN 45* 47* 53* 57* 55*  CREATININE 4.13* 4.27* 4.00* 3.86* 3.55*  CALCIUM 9.0 8.5* 8.5* 8.4* 8.7*   Liver Function Tests: No results for input(s): AST, ALT, ALKPHOS, BILITOT, PROT, ALBUMIN in the last 168 hours. No results for input(s): LIPASE, AMYLASE in the last 168 hours. No results for input(s): AMMONIA in the last 168 hours. CBC:  Recent Labs Lab 08/24/14 2015 08/25/14 0554  WBC 13.0* 11.4*  HGB 5.8* 5.1*  HCT 18.7* 16.5*  MCV 76.3* 77.1*  PLT 554* 449*   Cardiac Enzymes: No results for input(s): CKTOTAL, CKMB, CKMBINDEX, TROPONINI in the last 168 hours. BNP: BNP (last 3 results)  Recent Labs  07/28/14 1404 08/24/14 2015  BNP 248.6* 122.7*    ProBNP (last 3 results)  Recent Labs  04/09/14 1212  PROBNP 537.6*    CBG:  Recent Labs Lab 08/25/14 1637 08/25/14 2155 08/26/14 0743 08/26/14 1138 08/26/14 1547  GLUCAP 251* 218* 235* 156* 151*       SignedDebbe Odea, MD [facility] 08/26/2014, 5:17 PM

## 2014-08-26 NOTE — Progress Notes (Signed)
Utilization Review Completed.  

## 2014-08-26 NOTE — Evaluation (Signed)
Physical Therapy Evaluation Patient Details Name: Roger Wood MRN: [id number] DOB: [birth year] Today's Date: 08/26/2014   History of Present Illness  Adm with dyspnea PMHx-CHF, DM, gout, OA, prostate Ca  Clinical Impression  Patient evaluated by Physical Therapy with no further acute PT needs identified. Pt ambulated a short distance (as he does at home per daughter) with 4L O2 with SaO2 98% ~30 seconds after ambulation (would not register while walking and holding RW). Pt/wife eager to go home and have all DME needed. Daughter reported after session (in confidence) that wife waits on pt and "doesn't make him do anything so he stays in the bed or the wheelchair too much." See below for any follow-up Physial Therapy or equipment needs. Per RN, MD is to d/c pt home today. Thank you for this referral.     Follow Up Recommendations Home health PT;Supervision - Intermittent    Equipment Recommendations  None recommended by PT    Recommendations for Other Services       Precautions / Restrictions Precautions Precautions: Fall Precaution Comments: critically low Hgb; Jehovah's witness      Mobility  Bed Mobility Overal bed mobility: Modified Independent             General bed mobility comments: supine to sit no rail  Transfers Overall transfer level: Needs assistance Equipment used: Rolling walker (2 wheeled) Transfers: Sit to/from Stand Sit to Stand: Min guard         General transfer comment: proper use of RW; per RN was very weak this morning, therefore close guarding; no imbalance noted  Ambulation/Gait Ambulation/Gait assistance: Supervision Ambulation Distance (Feet): 25 Feet Assistive device: Rolling walker (2 wheeled) Gait Pattern/deviations: Step-through pattern;Decreased stride length;Drifts right/left;Trunk flexed     General Gait Details: Flexed posture, mildly unsteady (even with RW), however no overt LOB; minimal dyspnea  Stairs             Wheelchair Mobility    Modified Rankin (Stroke Patients Only)       Balance   Sitting-balance support: No upper extremity supported;Feet supported Sitting balance-Leahy Scale: Good     Standing balance support: Bilateral upper extremity supported Standing balance-Leahy Scale: Poor Standing balance comment: no external assist/support required; did need UE support                             Pertinent Vitals/Pain Pain Assessment: No/denies pain    Home Living Family/patient expects to be discharged to:: Private residence Living Arrangements: Spouse/significant other Available Help at Discharge: Family;Available 24 hours/day (children in/out) Type of Home: House Home Access: Level entry     Home Layout: One level Home Equipment: Crutches;Walker - 2 wheels;Bedside commode;Wheelchair - manual      Prior Function Level of Independence: Needs assistance   Gait / Transfers Assistance Needed: crutches vs walker very short distances; using w/c inside home per daughter  ADL's / Homemaking Assistance Needed: wife "doing everything for him" per daughter  Comments: using crutches to walk around home after recent rehab     Hand Dominance   Dominant Hand: Right    Extremity/Trunk Assessment   Upper Extremity Assessment: LUE deficits/detail (reports stiffness in Lt elbow from gout)           Lower Extremity Assessment: Generalized weakness         Communication   Communication: No difficulties  Cognition Arousal/Alertness: Awake/alert Behavior During Therapy: WFL for tasks assessed/performed Overall Cognitive Status:  Within Functional Limits for tasks assessed                      General Comments      Exercises        Assessment/Plan    PT Assessment All further PT needs can be met in the next venue of care  PT Diagnosis Generalized weakness   PT Problem List Decreased strength;Decreased balance;Decreased mobility;Cardiopulmonary  status limiting activity  PT Treatment Interventions     PT Goals (Current goals can be found in the Care Plan section) Acute Rehab PT Goals Patient Stated Goal: Get back home PT Goal Formulation: All assessment and education complete, DC therapy    Frequency     Barriers to discharge        Co-evaluation               End of Session Equipment Utilized During Treatment: Oxygen Activity Tolerance: Patient tolerated treatment well Patient left: in bed;with call bell/phone within reach;with family/visitor present (sitting EOB with lunch) Nurse Communication: Mobility status         Time: 8421-0312 PT Time Calculation (min) (ACUTE ONLY): 17 min   Charges:   PT Evaluation $Initial PT Evaluation Tier I: 1 Procedure     PT G Codes:        Reilley Latorre 09-Sep-2014, 4:25 PM Pager [phone]

## 2014-08-26 NOTE — Progress Notes (Signed)
Patient has consistently maintain a saturation rate of 100 with rest, on 4 lits of oxygen. Will ambulate and document findings. See pre- ambulat

## 2014-08-26 NOTE — Progress Notes (Signed)
Patient ambulated per MD order. Patient place on 4lits of oxygen. Ambulated from bed to door of the room and back. Could not tolerate movement even with a walker because of extreme muscle weakness . However oxygen saturation remained at 100 , even though it was such a short walk. Per patient he has not been out of bed for more than a month. Patient will greatly benefit from a physical therapy consult. Will notify MD and continue to monitor. See saturations with movements below.   08/26/14 1032 08/26/14 1033 08/26/14 1034  Oxygen Therapy  SpO2 100 % 100 % 100 %

## 2014-08-26 NOTE — Progress Notes (Signed)
PT recommends home therapy.  Pt was referred to [facility] for RN services when discharged from [facility].  Social work was added as pt was having difficulty with medication copays.  They will resume those services and will add home health PT.  [facility] liaison notified and has arranged home visit for tomorrow.

## 2014-08-26 NOTE — Progress Notes (Signed)
Patient discharge home to wife and daughter. i have gone over all discharge instructions with patient , taken out all IVs, signed all discharge summaries . Patient is stable and ready to leave.  He is in the room with the family waiting for his son, and will be wheeled out as soon as he gets here. Last vitals before discharge as follows.   08/26/14 1800  Vitals  BP (!) 139/59 mmHg  MAP (mmHg) 76  Pulse Rate 67  ECG Heart Rate 67  Resp 14  Oxygen Therapy  SpO2 100 %

## 2014-09-05 ENCOUNTER — Encounter (HOSPITAL_COMMUNITY): Payer: Self-pay | Admitting: Emergency Medicine

## 2014-09-05 ENCOUNTER — Emergency Department (HOSPITAL_COMMUNITY): Payer: Medicare Other

## 2014-09-05 DIAGNOSIS — Z8546 Personal history of malignant neoplasm of prostate: Secondary | ICD-10-CM | POA: Diagnosis not present

## 2014-09-05 DIAGNOSIS — Z87891 Personal history of nicotine dependence: Secondary | ICD-10-CM | POA: Insufficient documentation

## 2014-09-05 DIAGNOSIS — E1121 Type 2 diabetes mellitus with diabetic nephropathy: Secondary | ICD-10-CM | POA: Diagnosis not present

## 2014-09-05 DIAGNOSIS — Z79899 Other long term (current) drug therapy: Secondary | ICD-10-CM | POA: Insufficient documentation

## 2014-09-05 DIAGNOSIS — H269 Unspecified cataract: Secondary | ICD-10-CM | POA: Insufficient documentation

## 2014-09-05 DIAGNOSIS — D649 Anemia, unspecified: Secondary | ICD-10-CM | POA: Insufficient documentation

## 2014-09-05 DIAGNOSIS — Z8719 Personal history of other diseases of the digestive system: Secondary | ICD-10-CM | POA: Diagnosis not present

## 2014-09-05 DIAGNOSIS — R0602 Shortness of breath: Secondary | ICD-10-CM | POA: Diagnosis not present

## 2014-09-05 DIAGNOSIS — M7989 Other specified soft tissue disorders: Secondary | ICD-10-CM | POA: Insufficient documentation

## 2014-09-05 DIAGNOSIS — I10 Essential (primary) hypertension: Secondary | ICD-10-CM | POA: Insufficient documentation

## 2014-09-05 LAB — BASIC METABOLIC PANEL
ANION GAP: 12 (ref 5–15)
BUN: 54 mg/dL — ABNORMAL HIGH (ref 6–20)
CALCIUM: 8.7 mg/dL — AB (ref 8.9–10.3)
CHLORIDE: 93 mmol/L — AB (ref 101–111)
CO2: 25 mmol/L (ref 22–32)
Creatinine, Ser: 3.53 mg/dL — ABNORMAL HIGH (ref 0.61–1.24)
GFR calc Af Amer: 18 mL/min — ABNORMAL LOW (ref >60)
GFR, EST NON AFRICAN AMERICAN: 15 mL/min — AB (ref >60)
Glucose, Bld: 175 mg/dL — ABNORMAL HIGH (ref 65–99)
Potassium: 4.9 mmol/L (ref 3.5–5.1)
SODIUM: 130 mmol/L — AB (ref 135–145)

## 2014-09-05 LAB — CBC
HEMATOCRIT: 19 % — AB (ref 39.0–52.0)
Hemoglobin: 5.7 g/dL — CL (ref 13.0–17.0)
MCH: 23.2 pg — ABNORMAL LOW (ref 26.0–34.0)
MCHC: 30 g/dL (ref 30.0–36.0)
MCV: 77.2 fL — ABNORMAL LOW (ref 78.0–100.0)
Platelets: 443 10*3/uL — ABNORMAL HIGH (ref 150–400)
RBC: 2.46 MIL/uL — ABNORMAL LOW (ref 4.22–5.81)
RDW: 22.5 % — ABNORMAL HIGH (ref 11.5–15.5)
WBC: 12 10*3/uL — AB (ref 4.0–10.5)

## 2014-09-05 LAB — I-STAT TROPONIN, ED: Troponin i, poc: 0 ng/mL (ref 0.00–0.08)

## 2014-09-05 MED ORDER — NITROGLYCERIN 0.4 MG SL SUBL: 0.4000 mg | SUBLINGUAL_TABLET | SUBLINGUAL | Status: DC | PRN

## 2014-09-05 NOTE — ED Notes (Signed)
Patient having shortness of breath for the last three hours.  Patient denies chest pain.  Patient states that it is better now.  Patient is on home O2.

## 2014-09-06 ENCOUNTER — Emergency Department (HOSPITAL_COMMUNITY)
Admission: EM | Admit: 2014-09-06 | Discharge: 2014-09-06 | Disposition: A | Discharge status: Home / Self Care | Payer: Medicare Other | Attending: Emergency Medicine | Admitting: Emergency Medicine

## 2014-09-06 DIAGNOSIS — R0602 Shortness of breath: Secondary | ICD-10-CM

## 2014-09-06 DIAGNOSIS — D649 Anemia, unspecified: Secondary | ICD-10-CM

## 2014-09-06 LAB — BRAIN NATRIURETIC PEPTIDE: B Natriuretic Peptide: 85 pg/mL (ref 0.0–100.0)

## 2014-09-06 NOTE — ED Notes (Signed)
Dr. Horton at bedside. 

## 2014-09-06 NOTE — ED Provider Notes (Signed)
CSN: [id number]     Arrival date & time 09/05/14  2248 History   First MD Initiated Contact with Patient 09/06/14 0011     This chart was scribed for Merryl Hacker, MD by Forrestine Him, ED Scribe. This patient was seen in room A08C/A08C and the patient's care was started 12:17 AM.   Chief Complaint  Patient presents with  . Shortness of Breath   The history is provided by the patient. No language interpreter was used.    HPI Comments: Roger Wood is a 74 y.o. male with a PMHx of HTN and DM who presents to the Emergency Department complaining of constant, ongoing, sudden onset, improving shortness of breath x 3 hours. No aggravating or alleviating factors at this time. Mr. Schwager also reports ongoing bilateral leg swelling that is not new for him. Pt is currently on Oxygen at home and states "i wouldn't be able to breathe without the oxygen". No recent fever, chills, chest pain, nausea, or vomiting. Pt is currently a [employer] and does not accept blood. Pt with known allergy to ASA.  Past Medical History  Diagnosis Date  . Hypertension   . Gout   . Arthritis   . History of radiation therapy 01/27/11-03/26/11    prostate  . Refusal of blood transfusions as patient is Jehovah's Witness   . Cataract     b/l catartact surgery  . Anemia     takes iron supplements  . Prostate cancer 07/14/10    dx  . Diabetes mellitus     adult onset   . Type II diabetes mellitus with nephropathy     Patient denies  . Family history of adverse reaction to anesthesia     sister has difficulty waking up  . Dieulafoy ulcer    Past Surgical History  Procedure Laterality Date  . Esophagogastroduodenoscopy N/A 07/23/2014    Procedure: ESOPHAGOGASTRODUODENOSCOPY (EGD);  Surgeon: Gatha Mayer, MD;  Location: [facility] ENDOSCOPY;  Service: Endoscopy;  Laterality: N/A;   Family History  Problem Relation Age of Onset  . Breast cancer Sister   . Breast cancer Paternal Aunt   . Colon cancer Neg Hx   .  Diabetes Mother   . Diabetes Sister   . Diabetes Paternal Aunt   . Heart disease Father   . Stroke Paternal Uncle   . Cancer Paternal Uncle     back   History  Substance Use Topics  . Smoking status: Former Smoker    Types: Cigarettes    Quit date: 04/24/1967  . Smokeless tobacco: Never Used  . Alcohol Use: No    Review of Systems  Constitutional: Negative for fever and chills.  Respiratory: Positive for shortness of breath. Negative for cough and chest tightness.   Cardiovascular: Positive for leg swelling. Negative for chest pain.  Gastrointestinal: Negative for nausea and vomiting.  Skin: Negative for rash.  Psychiatric/Behavioral: Negative for confusion.      Allergies  Asa  Home Medications   Prior to Admission medications   Medication Sig Start Date End Date Taking? Authorizing Provider  albuterol (PROVENTIL HFA;VENTOLIN HFA) 108 (90 BASE) MCG/ACT inhaler Inhale 1-2 puffs into the lungs every 6 (six) hours as needed for wheezing or shortness of breath.    Historical Provider, MD  docusate sodium (COLACE) 100 MG capsule Take 1 capsule (100 mg total) by mouth 2 (two) times daily as needed for mild constipation. Patient not taking: Reported on 08/25/2014 07/29/14   Annita Brod,  MD  ferrous sulfate 325 (65 FE) MG tablet Take 1 tablet (325 mg total) by mouth 3 (three) times daily with meals. 08/02/14   Velvet Bathe, MD  furosemide (LASIX) 40 MG tablet Take 40 mg by mouth daily.     Historical Provider, MD  HUMALOG KWIKPEN 100 UNIT/ML KiwkPen Inject 2-6 Units into the skin daily. Uses a sliding scale 08/18/14   Historical Provider, MD  hydrALAZINE (APRESOLINE) 25 MG tablet Take 1 tablet (25 mg total) by mouth 2 (two) times daily. Patient taking differently: Take 12.5 mg by mouth 2 (two) times daily.  08/02/14   Velvet Bathe, MD  hydrocortisone (ANUSOL-HC) 2.5 % rectal cream Place rectally 3 (three) times daily. 08/06/14   Velvet Bathe, MD  metoprolol succinate (TOPROL-XL) 50  MG 24 hr tablet Take 50 mg by mouth daily. 07/02/14   Historical Provider, MD  omeprazole (PRILOSEC) 20 MG capsule Take 1 capsule (20 mg total) by mouth 2 (two) times daily before a meal. 07/29/14   Annita Brod, MD  pioglitazone (ACTOS) 15 MG tablet Take 1 tablet (15 mg total) by mouth daily. Patient not taking: Reported on 08/25/2014 07/29/14   Annita Brod, MD   Triage Vitals: BP 152/52 mmHg  Pulse 60  Temp(Src) 98.2 F (36.8 C) (Oral)  Resp 19  SpO2 100%   Physical Exam  Constitutional: He is oriented to person, place, and time. No distress.  Elderly, overweight  HENT:  Head: Normocephalic and atraumatic.  Eyes: Pupils are equal, round, and reactive to light.  Cardiovascular: Normal rate, regular rhythm and normal heart sounds.   No murmur heard. Pulmonary/Chest: Effort normal and breath sounds normal. No respiratory distress. He has no wheezes.  Nasal cannula in place  Abdominal: Soft. Bowel sounds are normal. There is no tenderness. There is no rebound.  Musculoskeletal: He exhibits edema.  1+ symmetric bilateral lower extremity edema  Neurological: He is alert and oriented to person, place, and time.  Skin: Skin is warm and dry.  Psychiatric: He has a normal mood and affect.  Nursing note and vitals reviewed.   ED Course  Procedures (including critical care time)  DIAGNOSTIC STUDIES: Oxygen Saturation is 100% on RA, Normal by my interpretation.    COORDINATION OF CARE: 12:22 AM- Will order CBC, BMP, i-stat troponin, and CXR. Will give Nitrostat. Discussed treatment plan with pt at bedside and pt agreed to plan.     Labs Review Labs Reviewed  CBC - Abnormal; Notable for the following:    WBC 12.0 (*)    RBC 2.46 (*)    Hemoglobin 5.7 (*)    HCT 19.0 (*)    MCV 77.2 (*)    MCH 23.2 (*)    RDW 22.5 (*)    Platelets 443 (*)    All other components within normal limits  BASIC METABOLIC PANEL - Abnormal; Notable for the following:    Sodium 130 (*)     Chloride 93 (*)    Glucose, Bld 175 (*)    BUN 54 (*)    Creatinine, Ser 3.53 (*)    Calcium 8.7 (*)    GFR calc non Af Amer 15 (*)    GFR calc Af Amer 18 (*)    All other components within normal limits  BRAIN NATRIURETIC PEPTIDE  I-STAT TROPOININ, ED    Imaging Review Dg Chest 2 View  09/06/2014   CLINICAL DATA:  Acute onset of shortness of breath. Initial encounter.  EXAM: CHEST  2  VIEW  COMPARISON:  Chest radiograph performed 08/24/2014, and CT of the chest performed 08/26/2014  FINDINGS: The lungs are well-aerated and clear. There is no evidence of focal opacification, pleural effusion or pneumothorax.  The heart is normal in size; the mediastinal contour is within normal limits. No acute osseous abnormalities are seen. Clips are noted within the right upper quadrant, reflecting prior cholecystectomy.  IMPRESSION: No acute cardiopulmonary process seen.   Electronically Signed   By: Garald Balding M.D.   On: 09/06/2014 00:29     EKG Interpretation   Date/Time:  Sunday Sep 05 2014 23:02:47 EDT Ventricular Rate:  68 PR Interval:  168 QRS Duration: 100 QT Interval:  418 QTC Calculation: 444 R Axis:   19 Text Interpretation:  Normal sinus rhythm Nonspecific T wave abnormality  Abnormal ECG T waves more prominent when compared to prior Confirmed by  HORTON  MD, COURTNEY ([id number]) on 09/06/2014 12:06:38 AM      MDM   Final diagnoses:  Shortness of breath  Anemia, unspecified anemia type    Patient presents with shortness of breath. Has had several evaluations for the same including a recent admission. Patient is no longer short of breath. There is nonexertional or positional component.  At the time of his recent admission he had a VQ scan that was reassuring and low probability for PE. History of severe anemia which has been thought to contribute to shortness of breath. On 4 L home O2. Exam is largely reassuring. EKG nonischemic. Patient without chest pain. Doubt ACS. Chest x-ray  is negative. Patient does have evidence of lower extremity edema but no evidence of vascular congestion on chest x-ray.  BNP is normal. Lab work is at the patient's baseline. Hemoglobin is 5.7 which is actually increased from 5.1. Patient remains comfortable on reexam. Discuss with patient and his wife follow-up with primary physician. He also needs formal cardiology evaluation. He was evaluated by cardiology while in the hospital back in December and at that time was not felt to need formal stress testing as his symptoms were atypical. He does have risk factors and given that he continues to have episodes of shortness of breath, he likely need stress testing to rule out this is an anginal equivalent.  The patient and his wife stated understanding.  After history, exam, and medical workup I feel the patient has been appropriately medically screened and is safe for discharge home. Pertinent diagnoses were discussed with the patient. Patient was given return precautions.  I personally performed the services described in this documentation, which was scribed in my presence. The recorded information has been reviewed and is accurate.   Merryl Hacker, MD 09/06/14 [id number]

## 2014-09-06 NOTE — Discharge Instructions (Signed)
You were seen today for shortness of breath. Your workup is reassuring. You have had a recent evaluation and admission to rule out pulmonary embolism and cardiac testing is reassuring.  Your severely anemic which is likely contributing to her shortness of breath. You need follow-up with her primary physician for recheck if he continued to have further episodes of shortness of breath.  You also should follow-up with cardiology for formal cardiac evaluation.  Shortness of Breath Shortness of breath means you have trouble breathing. It could also mean that you have a medical problem. You should get immediate medical care for shortness of breath. CAUSES   Not enough oxygen in the air such as with high altitudes or a smoke-filled room.  Certain lung diseases, infections, or problems.  Heart disease or conditions, such as angina or heart failure.  Low red blood cells (anemia).  Poor physical fitness, which can cause shortness of breath when you exercise.  Chest or back injuries or stiffness.  Being overweight.  Smoking.  Anxiety, which can make you feel like you are not getting enough air. DIAGNOSIS  Serious medical problems can often be found during your physical exam. Tests may also be done to determine why you are having shortness of breath. Tests may include:  Chest X-rays.  Lung function tests.  Blood tests.  An electrocardiogram (ECG).  An ambulatory electrocardiogram. An ambulatory ECG records your heartbeat patterns over a 24-hour period.  Exercise testing.  A transthoracic echocardiogram (TTE). During echocardiography, sound waves are used to evaluate how blood flows through your heart.  A transesophageal echocardiogram (TEE).  Imaging scans. Your health care provider may not be able to find a cause for your shortness of breath after your exam. In this case, it is important to have a follow-up exam with your health care provider as directed.  TREATMENT  Treatment for  shortness of breath depends on the cause of your symptoms and can vary greatly. HOME CARE INSTRUCTIONS   Do not smoke. Smoking is a common cause of shortness of breath. If you smoke, ask for help to quit.  Avoid being around chemicals or things that may bother your breathing, such as paint fumes and dust.  Rest as needed. Slowly resume your usual activities.  If medicines were prescribed, take them as directed for the full length of time directed. This includes oxygen and any inhaled medicines.  Keep all follow-up appointments as directed by your health care provider. SEEK MEDICAL CARE IF:   Your condition does not improve in the time expected.  You have a hard time doing your normal activities even with rest.  You have any new symptoms. SEEK IMMEDIATE MEDICAL CARE IF:   Your shortness of breath gets worse.  You feel light-headed, faint, or develop a cough not controlled with medicines.  You start coughing up blood.  You have pain with breathing.  You have chest pain or pain in your arms, shoulders, or abdomen.  You have a fever.  You are unable to walk up stairs or exercise the way you normally do. MAKE SURE YOU:  Understand these instructions.  Will watch your condition.  Will get help right away if you are not doing well or get worse. Document Released: 01/02/2001 Document Revised: 04/14/2013 Document Reviewed: 06/25/2011 [facility] Patient Information 2015 [place], [place]. This information is not intended to replace advice given to you by your health care provider. Make sure you discuss any questions you have with your health care provider.

## 2014-09-15 ENCOUNTER — Other Ambulatory Visit (HOSPITAL_BASED_OUTPATIENT_CLINIC_OR_DEPARTMENT_OTHER): Payer: Medicare Other

## 2014-09-15 ENCOUNTER — Telehealth: Payer: Self-pay | Admitting: *Deleted

## 2014-09-15 ENCOUNTER — Ambulatory Visit (HOSPITAL_BASED_OUTPATIENT_CLINIC_OR_DEPARTMENT_OTHER): Payer: Medicare Other

## 2014-09-15 VITALS — BP 139/89 | HR 52 | Temp 98.1°F

## 2014-09-15 DIAGNOSIS — C61 Malignant neoplasm of prostate: Secondary | ICD-10-CM

## 2014-09-15 DIAGNOSIS — D631 Anemia in chronic kidney disease: Secondary | ICD-10-CM

## 2014-09-15 DIAGNOSIS — N189 Chronic kidney disease, unspecified: Secondary | ICD-10-CM

## 2014-09-15 DIAGNOSIS — D472 Monoclonal gammopathy: Secondary | ICD-10-CM

## 2014-09-15 DIAGNOSIS — D649 Anemia, unspecified: Secondary | ICD-10-CM

## 2014-09-15 LAB — CBC WITH DIFFERENTIAL/PLATELET
BASO%: 0.2 % (ref 0.0–2.0)
Basophils Absolute: 0 10*3/uL (ref 0.0–0.1)
EOS%: 1.9 % (ref 0.0–7.0)
Eosinophils Absolute: 0.2 10*3/uL (ref 0.0–0.5)
HCT: 17.8 % — ABNORMAL LOW (ref 38.4–49.9)
HEMOGLOBIN: 5.4 g/dL — AB (ref 13.0–17.1)
LYMPH%: 18.2 % (ref 14.0–49.0)
MCH: 23.7 pg — ABNORMAL LOW (ref 27.2–33.4)
MCHC: 30.3 g/dL — ABNORMAL LOW (ref 32.0–36.0)
MCV: 78.1 fL — ABNORMAL LOW (ref 79.3–98.0)
MONO#: 0.7 10*3/uL (ref 0.1–0.9)
MONO%: 7.6 % (ref 0.0–14.0)
NEUT#: 6.5 10*3/uL (ref 1.5–6.5)
NEUT%: 72.1 % (ref 39.0–75.0)
Platelets: 377 10*3/uL (ref 140–400)
RBC: 2.28 10*6/uL — ABNORMAL LOW (ref 4.20–5.82)
RDW: 21.8 % — ABNORMAL HIGH (ref 11.0–14.6)
WBC: 9.1 10*3/uL (ref 4.0–10.3)
lymph#: 1.7 10*3/uL (ref 0.9–3.3)
nRBC: 0 % (ref 0–0)

## 2014-09-15 MED ORDER — DARBEPOETIN ALFA 300 MCG/0.6ML IJ SOSY
300.0000 ug | PREFILLED_SYRINGE | Freq: Once | INTRAMUSCULAR | Status: AC
  Administered 2014-09-15: 300 ug via SUBCUTANEOUS
  Filled 2014-09-15: qty 0.6

## 2014-09-15 NOTE — Telephone Encounter (Signed)
Pt here for Aranesp injection- Labs reviewed with MD, recommended blood transfusion, pt declined. VO ok for injection. No further concerns.

## 2014-10-06 ENCOUNTER — Ambulatory Visit (HOSPITAL_BASED_OUTPATIENT_CLINIC_OR_DEPARTMENT_OTHER): Payer: Medicare Other

## 2014-10-06 ENCOUNTER — Encounter: Payer: Self-pay | Admitting: Internal Medicine

## 2014-10-06 ENCOUNTER — Telehealth: Payer: Self-pay | Admitting: Internal Medicine

## 2014-10-06 ENCOUNTER — Other Ambulatory Visit (HOSPITAL_BASED_OUTPATIENT_CLINIC_OR_DEPARTMENT_OTHER): Payer: Medicare Other

## 2014-10-06 ENCOUNTER — Ambulatory Visit (HOSPITAL_BASED_OUTPATIENT_CLINIC_OR_DEPARTMENT_OTHER): Payer: Medicare Other | Admitting: Internal Medicine

## 2014-10-06 VITALS — BP 168/61 | HR 65 | Temp 98.0°F | Resp 19 | Ht 71.0 in | Wt 232.4 lb

## 2014-10-06 DIAGNOSIS — D631 Anemia in chronic kidney disease: Secondary | ICD-10-CM

## 2014-10-06 DIAGNOSIS — D638 Anemia in other chronic diseases classified elsewhere: Secondary | ICD-10-CM

## 2014-10-06 DIAGNOSIS — N189 Chronic kidney disease, unspecified: Secondary | ICD-10-CM

## 2014-10-06 DIAGNOSIS — I10 Essential (primary) hypertension: Secondary | ICD-10-CM

## 2014-10-06 LAB — CBC WITH DIFFERENTIAL/PLATELET
BASO%: 0.4 % (ref 0.0–2.0)
Basophils Absolute: 0 10*3/uL (ref 0.0–0.1)
EOS%: 1.5 % (ref 0.0–7.0)
Eosinophils Absolute: 0.1 10*3/uL (ref 0.0–0.5)
HCT: 21.4 % — ABNORMAL LOW (ref 38.4–49.9)
HGB: 6.5 g/dL — CL (ref 13.0–17.1)
LYMPH#: 1.5 10*3/uL (ref 0.9–3.3)
LYMPH%: 17.4 % (ref 14.0–49.0)
MCH: 23.5 pg — ABNORMAL LOW (ref 27.2–33.4)
MCHC: 30.4 g/dL — ABNORMAL LOW (ref 32.0–36.0)
MCV: 77.3 fL — AB (ref 79.3–98.0)
MONO#: 0.7 10*3/uL (ref 0.1–0.9)
MONO%: 7.7 % (ref 0.0–14.0)
NEUT%: 73 % (ref 39.0–75.0)
NEUTROS ABS: 6.2 10*3/uL (ref 1.5–6.5)
PLATELETS: 308 10*3/uL (ref 140–400)
RBC: 2.77 10*6/uL — AB (ref 4.20–5.82)
RDW: 21.3 % — ABNORMAL HIGH (ref 11.0–14.6)
WBC: 8.4 10*3/uL (ref 4.0–10.3)

## 2014-10-06 LAB — IRON AND TIBC CHCC
%SAT: 23 % (ref 20–55)
Iron: 43 ug/dL (ref 42–163)
TIBC: 186 ug/dL — AB (ref 202–409)
UIBC: 143 ug/dL (ref 117–376)

## 2014-10-06 LAB — FERRITIN CHCC: FERRITIN: 791 ng/mL — AB (ref 22–316)

## 2014-10-06 MED ORDER — DARBEPOETIN ALFA 300 MCG/0.6ML IJ SOSY
300.0000 ug | PREFILLED_SYRINGE | Freq: Once | INTRAMUSCULAR | Status: AC
  Administered 2014-10-06: 300 ug via SUBCUTANEOUS
  Filled 2014-10-06: qty 0.6

## 2014-10-06 NOTE — Progress Notes (Signed)
[place] [phone]   [phone] OFFICE PROGRESS NOTE   DIAGNOSIS:  1) Anemia of chronic disease.  2) Monoclonal gammopathy of undetermined significance. 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 Janice Norrie.  4) uncontrolled hypertension.  PRIOR THERAPY: Feraheme Infusion last dose was given 12/05/2012.   CURRENT THERAPY: Aranesp 300 mcg subcutaneously every 3 weeks.  INTERVAL HISTORY: Roger Wood 74 y.o. male returns to the clinic today for followup visit accompanied by his son. The patient has been complaining of increasing fatigue and weakness recently. His hemoglobin dropped down to 5.4 three weeks ago. 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 He denied having any chest pain, shortness of breath, cough or hemoptysis. He is tolerating his treatment with Aranesp 300 mcg subcutaneously every 3 weeks fairly well. He denied having any significant weight loss or night sweats. He denied having any nausea or vomiting or rectal bleeding. He has repeat iron study and ferritin performed recently and he is here for evaluation and discussion of his lab results.  MEDICAL HISTORY: Past Medical History  Diagnosis Date  . Hypertension   . Gout   . Arthritis   . History of radiation therapy 01/27/11-03/26/11    prostate  . Refusal of blood transfusions as patient is Jehovah's Witness   . Cataract     b/l catartact surgery  . Anemia     takes iron supplements  . Prostate cancer 07/14/10    dx  . Diabetes mellitus     adult onset   . Type II diabetes mellitus with nephropathy     Patient denies  . Family history of adverse reaction to anesthesia     sister has difficulty waking up  . Dieulafoy ulcer     ALLERGIES:  is allergic to asa.  MEDICATIONS:  Current Outpatient Prescriptions  Medication Sig Dispense Refill  . albuterol  (PROVENTIL HFA;VENTOLIN HFA) 108 (90 BASE) MCG/ACT inhaler Inhale 1-2 puffs into the lungs every 6 (six) hours as needed for wheezing or shortness of breath.    . docusate sodium (COLACE) 100 MG capsule Take 1 capsule (100 mg total) by mouth 2 (two) times daily as needed for mild constipation. 10 capsule 0  . ferrous sulfate 325 (65 FE) MG tablet Take 1 tablet (325 mg total) by mouth 3 (three) times daily with meals. 90 tablet 0  . furosemide (LASIX) 40 MG tablet Take 40 mg by mouth daily.     Marland Kitchen HUMALOG KWIKPEN 100 UNIT/ML KiwkPen Inject 2-6 Units into the skin daily. Uses a sliding scale    . hydrALAZINE (APRESOLINE) 25 MG tablet Take 1 tablet (25 mg total) by mouth 2 (two) times daily. (Patient taking differently: Take 12.5 mg by mouth 2 (two) times daily. )    . hydrocortisone (ANUSOL-HC) 2.5 % rectal cream Place rectally 3 (three) times daily. 30 g 0  . metoprolol succinate (TOPROL-XL) 50 MG 24 hr tablet Take 50 mg by mouth daily.    Marland Kitchen omeprazole (PRILOSEC) 20 MG capsule Take 1 capsule (20 mg total) by mouth 2 (two) times daily before a meal. 60 capsule 1  . pioglitazone (ACTOS) 15 MG tablet Take 1 tablet (15 mg total) by mouth daily. 30 tablet 1  . ULORIC 40 MG tablet     . HYDROcodone-acetaminophen (NORCO/VICODIN) 5-325 MG per tablet Take 1 tablet by mouth.  No current facility-administered medications for this visit.    REVIEW OF SYSTEMS:  Constitutional: positive for fatigue Eyes: negative Ears, nose, mouth, throat, and face: negative Respiratory: positive for dyspnea on exertion Cardiovascular: negative Gastrointestinal: negative Genitourinary:negative Integument/breast: negative Hematologic/lymphatic: negative Musculoskeletal:negative Neurological: negative Behavioral/Psych: negative Endocrine: negative Allergic/Immunologic: negative   PHYSICAL EXAMINATION: General appearance: alert, cooperative, fatigued and no distress Head: Normocephalic, without obvious abnormality,  atraumatic Neck: no adenopathy, no JVD, supple, symmetrical, trachea midline and thyroid not enlarged, symmetric, no tenderness/mass/nodules Lymph nodes: Cervical, supraclavicular, and axillary nodes normal. Resp: clear to auscultation bilaterally Back: symmetric, no curvature. ROM normal. No CVA tenderness. Cardio: regular rate and rhythm, S1, S2 normal, no murmur, click, rub or gallop GI: soft, non-tender; bowel sounds normal; no masses,  no organomegaly Extremities: extremities normal, atraumatic, no cyanosis or edema  ECOG PERFORMANCE STATUS: 2 - Symptomatic, <50% confined to bed  Blood pressure 168/61, pulse 65, temperature 98 F (36.7 C), temperature source Oral, resp. rate 19, height 5\' 11"  (1.803 m), weight 232 lb 6.4 oz (105.416 kg), SpO2 100 %.  LABORATORY DATA: Lab Results  Component Value Date   WBC 8.4 10/06/2014   HGB 6.5* 10/06/2014   HCT 21.4* 10/06/2014   MCV 77.3* 10/06/2014   PLT 308 10/06/2014      Chemistry      Component Value Date/Time   NA 130* 09/05/2014 2258   NA 134* 03/17/2014 1238   K 4.9 09/05/2014 2258   K 4.3 03/17/2014 1238   CL 93* 09/05/2014 2258   CL 106 01/29/2012 1520   CO2 25 09/05/2014 2258   CO2 21* 03/17/2014 1238   BUN 54* 09/05/2014 2258   BUN 64.4* 03/17/2014 1238   CREATININE 3.53* 09/05/2014 2258   CREATININE 2.9* 03/17/2014 1238      Component Value Date/Time   CALCIUM 8.7* 09/05/2014 2258   CALCIUM 9.2 03/17/2014 1238   ALKPHOS 49 07/25/2014 0530   ALKPHOS 112 03/17/2014 1238   AST 11 07/25/2014 0530   AST 9 03/17/2014 1238   ALT 11 07/25/2014 0530   ALT 11 03/17/2014 1238   BILITOT 0.5 07/25/2014 0530   BILITOT 0.31 03/17/2014 1238       RADIOGRAPHIC STUDIES:   ASSESSMENT AND PLAN: This is a very pleasant 74 years old [place] American male with persistent anemia most likely secondary to anemia of chronic disease secondary to chronic renal insufficiency.   His myeloma panel and iron study showed no significant  disease progression or iron deficiency. The patient continues to have persistent anemia and he is Jehov[employer]'s Witness. I recommended for him to continue treatment with Aranesp 300 mcg subcutaneously every 3 weeks for the anemia of chronic disease.  I will also arrange for the patient to receive Feraheme infusion 510 MG weekly 2 doses first dose on 10/08/2014. He would come back for follow-up visit in one month for reevaluation with repeat CBC, iron study and ferritin. He was advised to call immediately if he has any concerning symptoms in the interval. The patient voices understanding of current disease status and treatment options and is in agreement with the current care plan.  All questions were answered. The patient knows to call the clinic with any problems, questions or concerns. We can certainly see the patient much sooner if necessary.  Disclaimer: This note was dictated with voice recognition software. Similar sounding words can inadvertently be transcribed and may not be corrected upon review.

## 2014-10-06 NOTE — Telephone Encounter (Signed)
Pt confirmed labs/ov per 06/15 POF, gave pt AVS and Calendar..... KJ, sent msg to add Feraheme and I will contact pt with D/T... KJ

## 2014-10-07 ENCOUNTER — Telehealth: Payer: Self-pay | Admitting: Internal Medicine

## 2014-10-07 NOTE — Telephone Encounter (Signed)
S/w pt's wife confirming Feraheme for tomorrow, wanted to see if they could move later in the day due to the drive in [place], s/w chemo charge nurse and they moved it down later in the day, pt's wife confirmed... KJ

## 2014-10-08 ENCOUNTER — Ambulatory Visit (HOSPITAL_BASED_OUTPATIENT_CLINIC_OR_DEPARTMENT_OTHER): Payer: Medicare Other

## 2014-10-08 VITALS — BP 183/62 | HR 65 | Temp 97.5°F | Resp 18

## 2014-10-08 DIAGNOSIS — N184 Chronic kidney disease, stage 4 (severe): Secondary | ICD-10-CM

## 2014-10-08 DIAGNOSIS — N189 Chronic kidney disease, unspecified: Secondary | ICD-10-CM | POA: Diagnosis not present

## 2014-10-08 DIAGNOSIS — D638 Anemia in other chronic diseases classified elsewhere: Secondary | ICD-10-CM

## 2014-10-08 MED ORDER — SODIUM CHLORIDE 0.9 % IV SOLN
510.0000 mg | Freq: Once | INTRAVENOUS | Status: AC
  Administered 2014-10-08: 510 mg via INTRAVENOUS
  Filled 2014-10-08: qty 17

## 2014-10-08 NOTE — Patient Instructions (Signed)

## 2014-10-08 NOTE — Progress Notes (Signed)
MD [facility] notified of patient blood pressure 179/63. Ok to proceed with feraheme and with no further interventions.

## 2014-10-15 ENCOUNTER — Ambulatory Visit (HOSPITAL_BASED_OUTPATIENT_CLINIC_OR_DEPARTMENT_OTHER): Payer: Medicare Other

## 2014-10-15 VITALS — BP 163/55 | HR 68 | Temp 99.4°F | Resp 18

## 2014-10-15 DIAGNOSIS — D638 Anemia in other chronic diseases classified elsewhere: Secondary | ICD-10-CM

## 2014-10-15 DIAGNOSIS — N189 Chronic kidney disease, unspecified: Secondary | ICD-10-CM | POA: Diagnosis not present

## 2014-10-15 DIAGNOSIS — N184 Chronic kidney disease, stage 4 (severe): Secondary | ICD-10-CM

## 2014-10-15 MED ORDER — FERUMOXYTOL INJECTION 510 MG/17 ML
510.0000 mg | Freq: Once | INTRAVENOUS | Status: AC
  Administered 2014-10-15: 510 mg via INTRAVENOUS
  Filled 2014-10-15: qty 17

## 2014-10-15 NOTE — Patient Instructions (Signed)

## 2014-10-27 ENCOUNTER — Ambulatory Visit (HOSPITAL_BASED_OUTPATIENT_CLINIC_OR_DEPARTMENT_OTHER): Payer: Medicare Other

## 2014-10-27 ENCOUNTER — Other Ambulatory Visit (HOSPITAL_BASED_OUTPATIENT_CLINIC_OR_DEPARTMENT_OTHER): Payer: Medicare Other

## 2014-10-27 VITALS — BP 166/68 | HR 78 | Temp 97.8°F

## 2014-10-27 DIAGNOSIS — N189 Chronic kidney disease, unspecified: Secondary | ICD-10-CM

## 2014-10-27 DIAGNOSIS — R630 Anorexia: Secondary | ICD-10-CM

## 2014-10-27 DIAGNOSIS — E1121 Type 2 diabetes mellitus with diabetic nephropathy: Secondary | ICD-10-CM

## 2014-10-27 DIAGNOSIS — I159 Secondary hypertension, unspecified: Secondary | ICD-10-CM

## 2014-10-27 DIAGNOSIS — Z531 Procedure and treatment not carried out because of patient's decision for reasons of belief and group pressure: Secondary | ICD-10-CM

## 2014-10-27 DIAGNOSIS — D631 Anemia in chronic kidney disease: Secondary | ICD-10-CM

## 2014-10-27 DIAGNOSIS — D472 Monoclonal gammopathy: Secondary | ICD-10-CM

## 2014-10-27 DIAGNOSIS — Z8546 Personal history of malignant neoplasm of prostate: Secondary | ICD-10-CM

## 2014-10-27 LAB — CBC WITH DIFFERENTIAL/PLATELET
BASO%: 0.5 % (ref 0.0–2.0)
Basophils Absolute: 0 10*3/uL (ref 0.0–0.1)
EOS ABS: 0.1 10*3/uL (ref 0.0–0.5)
EOS%: 1.2 % (ref 0.0–7.0)
HEMATOCRIT: 28.8 % — AB (ref 38.4–49.9)
HGB: 9.2 g/dL — ABNORMAL LOW (ref 13.0–17.1)
LYMPH%: 13.6 % — AB (ref 14.0–49.0)
MCH: 24.3 pg — AB (ref 27.2–33.4)
MCHC: 32 g/dL (ref 32.0–36.0)
MCV: 76.1 fL — ABNORMAL LOW (ref 79.3–98.0)
MONO#: 0.8 10*3/uL (ref 0.1–0.9)
MONO%: 10.1 % (ref 0.0–14.0)
NEUT#: 6.1 10*3/uL (ref 1.5–6.5)
NEUT%: 74.6 % (ref 39.0–75.0)
PLATELETS: 304 10*3/uL (ref 140–400)
RBC: 3.78 10*6/uL — ABNORMAL LOW (ref 4.20–5.82)
RDW: 23.9 % — ABNORMAL HIGH (ref 11.0–14.6)
WBC: 8.2 10*3/uL (ref 4.0–10.3)
lymph#: 1.1 10*3/uL (ref 0.9–3.3)

## 2014-10-27 MED ORDER — DARBEPOETIN ALFA 300 MCG/0.6ML IJ SOSY
300.0000 ug | PREFILLED_SYRINGE | Freq: Once | INTRAMUSCULAR | Status: AC
  Administered 2014-10-27: 300 ug via SUBCUTANEOUS
  Filled 2014-10-27: qty 0.6

## 2014-11-10 ENCOUNTER — Other Ambulatory Visit (HOSPITAL_BASED_OUTPATIENT_CLINIC_OR_DEPARTMENT_OTHER): Payer: Medicare Other

## 2014-11-10 ENCOUNTER — Telehealth: Payer: Self-pay | Admitting: Internal Medicine

## 2014-11-10 ENCOUNTER — Ambulatory Visit (HOSPITAL_BASED_OUTPATIENT_CLINIC_OR_DEPARTMENT_OTHER): Payer: Medicare Other | Admitting: Internal Medicine

## 2014-11-10 ENCOUNTER — Other Ambulatory Visit: Payer: Self-pay | Admitting: *Deleted

## 2014-11-10 ENCOUNTER — Encounter: Payer: Self-pay | Admitting: Internal Medicine

## 2014-11-10 VITALS — BP 209/86 | HR 87 | Temp 97.5°F | Resp 18 | Ht 71.0 in | Wt 243.4 lb

## 2014-11-10 DIAGNOSIS — D638 Anemia in other chronic diseases classified elsewhere: Secondary | ICD-10-CM

## 2014-11-10 DIAGNOSIS — I10 Essential (primary) hypertension: Secondary | ICD-10-CM | POA: Diagnosis not present

## 2014-11-10 DIAGNOSIS — D472 Monoclonal gammopathy: Secondary | ICD-10-CM

## 2014-11-10 DIAGNOSIS — C61 Malignant neoplasm of prostate: Secondary | ICD-10-CM

## 2014-11-10 LAB — CBC & DIFF AND RETIC
BASO%: 0.4 % (ref 0.0–2.0)
Basophils Absolute: 0 10*3/uL (ref 0.0–0.1)
EOS%: 1.6 % (ref 0.0–7.0)
Eosinophils Absolute: 0.1 10*3/uL (ref 0.0–0.5)
HEMATOCRIT: 31.7 % — AB (ref 38.4–49.9)
HGB: 9.9 g/dL — ABNORMAL LOW (ref 13.0–17.1)
Immature Retic Fract: 12.9 % — ABNORMAL HIGH (ref 3.00–10.60)
LYMPH#: 1.3 10*3/uL (ref 0.9–3.3)
LYMPH%: 16.9 % (ref 14.0–49.0)
MCH: 24.6 pg — ABNORMAL LOW (ref 27.2–33.4)
MCHC: 31.2 g/dL — ABNORMAL LOW (ref 32.0–36.0)
MCV: 78.7 fL — AB (ref 79.3–98.0)
MONO#: 0.8 10*3/uL (ref 0.1–0.9)
MONO%: 10.5 % (ref 0.0–14.0)
NEUT%: 70.6 % (ref 39.0–75.0)
NEUTROS ABS: 5.4 10*3/uL (ref 1.5–6.5)
Platelets: 236 10*3/uL (ref 140–400)
RBC: 4.03 10*6/uL — ABNORMAL LOW (ref 4.20–5.82)
RDW: 20.8 % — AB (ref 11.0–14.6)
RETIC CT ABS: 80.2 10*3/uL (ref 34.80–93.90)
Retic %: 1.99 % — ABNORMAL HIGH (ref 0.80–1.80)
WBC: 7.7 10*3/uL (ref 4.0–10.3)

## 2014-11-10 LAB — IRON AND TIBC CHCC
%SAT: 30 % (ref 20–55)
IRON: 61 ug/dL (ref 42–163)
TIBC: 203 ug/dL (ref 202–409)
UIBC: 142 ug/dL (ref 117–376)

## 2014-11-10 LAB — FERRITIN CHCC

## 2014-11-10 MED ORDER — CLONIDINE HCL 0.1 MG PO TABS
0.2000 mg | ORAL_TABLET | Freq: Once | ORAL | Status: AC
  Administered 2014-11-10: 0.2 mg via ORAL

## 2014-11-10 NOTE — Progress Notes (Signed)
[place] [phone]   [phone] OFFICE PROGRESS NOTE   DIAGNOSIS:  1) Anemia of chronic disease.  2) Monoclonal gammopathy of undetermined significance. 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 Janice Norrie.  4) uncontrolled hypertension.  PRIOR THERAPY: Feraheme Infusion last dose was given 10/15/2014.   CURRENT THERAPY: Aranesp 300 mcg subcutaneously every 3 weeks.  INTERVAL HISTORY: Roger Wood 74 y.o. male returns to the clinic today for followup visit accompanied by his son. The patient has been complaining of increasing fatigue and weakness recently. His hemoglobin dropped down to 5.4 three weeks ago. 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 He denied having any chest pain, shortness of breath, cough or hemoptysis. He is tolerating his treatment with Aranesp 300 mcg subcutaneously every 3 weeks fairly well. He denied having any significant weight loss or night sweats. He denied having any nausea or vomiting or rectal bleeding. He has repeat iron study and ferritin performed recently and he is here for evaluation and discussion of his lab results.  MEDICAL HISTORY: Past Medical History  Diagnosis Date  . Hypertension   . Gout   . Arthritis   . History of radiation therapy 01/27/11-03/26/11    prostate  . Refusal of blood transfusions as patient is Jehov[employer]'s Witness   . Cataract     b/l catartact surgery  . Anemia     takes iron supplements  . Prostate cancer 07/14/10    dx  . Diabetes mellitus     adult onset   . Type II diabetes mellitus with nephropathy     Patient denies  . Family history of adverse reaction to anesthesia     sister has difficulty waking up  . Dieulafoy ulcer     ALLERGIES:  is allergic to asa.  MEDICATIONS:  Current Outpatient Prescriptions  Medication Sig Dispense Refill  . docusate  sodium (COLACE) 100 MG capsule Take 1 capsule (100 mg total) by mouth 2 (two) times daily as needed for mild constipation. 10 capsule 0  . ferrous sulfate 325 (65 FE) MG tablet Take 1 tablet (325 mg total) by mouth 3 (three) times daily with meals. 90 tablet 0  . metoprolol succinate (TOPROL-XL) 50 MG 24 hr tablet Take 50 mg by mouth daily.    Marland Kitchen ULORIC 40 MG tablet     . albuterol (PROVENTIL HFA;VENTOLIN HFA) 108 (90 BASE) MCG/ACT inhaler Inhale 1-2 puffs into the lungs every 6 (six) hours as needed for wheezing or shortness of breath.    . hydrALAZINE (APRESOLINE) 25 MG tablet Take 1 tablet (25 mg total) by mouth 2 (two) times daily. (Patient taking differently: Take 12.5 mg by mouth 2 (two) times daily. )    . hydrocortisone (ANUSOL-HC) 2.5 % rectal cream Place rectally 3 (three) times daily. (Patient not taking: Reported on 11/10/2014) 30 g 0   No current facility-administered medications for this visit.    REVIEW OF SYSTEMS:  Constitutional: positive for fatigue Eyes: negative Ears, nose, mouth, throat, and face: negative Respiratory: positive for dyspnea on exertion Cardiovascular: negative Gastrointestinal: negative Genitourinary:negative Integument/breast: negative Hematologic/lymphatic: negative Musculoskeletal:negative Neurological: negative Behavioral/Psych: negative Endocrine: negative Allergic/Immunologic: negative   PHYSICAL EXAMINATION: General appearance: alert, cooperative, fatigued and no distress Head: Normocephalic, without obvious abnormality, atraumatic Neck: no adenopathy, no JVD, supple, symmetrical, trachea midline and thyroid not enlarged, symmetric, no tenderness/mass/nodules Lymph  nodes: Cervical, supraclavicular, and axillary nodes normal. Resp: clear to auscultation bilaterally Back: symmetric, no curvature. ROM normal. No CVA tenderness. Cardio: regular rate and rhythm, S1, S2 normal, no murmur, click, rub or gallop GI: soft, non-tender; bowel sounds  normal; no masses,  no organomegaly Extremities: extremities normal, atraumatic, no cyanosis or edema  ECOG PERFORMANCE STATUS: 2 - Symptomatic, <50% confined to bed  Blood pressure 209/86, pulse 87, temperature 97.5 F (36.4 C), temperature source Oral, resp. rate 18, height 5\' 11"  (1.803 m), weight 243 lb 6.4 oz (110.406 kg), SpO2 100 %.  LABORATORY DATA: Lab Results  Component Value Date   WBC 7.7 11/10/2014   HGB 9.9* 11/10/2014   HCT 31.7* 11/10/2014   MCV 78.7* 11/10/2014   PLT 236 11/10/2014      Chemistry      Component Value Date/Time   NA 130* 09/05/2014 2258   NA 134* 03/17/2014 1238   K 4.9 09/05/2014 2258   K 4.3 03/17/2014 1238   CL 93* 09/05/2014 2258   CL 106 01/29/2012 1520   CO2 25 09/05/2014 2258   CO2 21* 03/17/2014 1238   BUN 54* 09/05/2014 2258   BUN 64.4* 03/17/2014 1238   CREATININE 3.53* 09/05/2014 2258   CREATININE 2.9* 03/17/2014 1238      Component Value Date/Time   CALCIUM 8.7* 09/05/2014 2258   CALCIUM 9.2 03/17/2014 1238   ALKPHOS 49 07/25/2014 0530   ALKPHOS 112 03/17/2014 1238   AST 11 07/25/2014 0530   AST 9 03/17/2014 1238   ALT 11 07/25/2014 0530   ALT 11 03/17/2014 1238   BILITOT 0.5 07/25/2014 0530   BILITOT 0.31 03/17/2014 1238       RADIOGRAPHIC STUDIES:   ASSESSMENT AND PLAN: This is a very pleasant 74 years old [place] American male with persistent anemia most likely secondary to anemia of chronic disease secondary to chronic renal insufficiency.   His myeloma panel and iron study showed no significant disease progression or iron deficiency. The patient continues to have persistent anemia and he is Je[employer]'s Witness. I recommended for him to continue treatment with Aranesp 300 mcg subcutaneously every 3 weeks for the anemia of chronic disease.  I will also arrange for the patient to receive Feraheme infusion 510 MG weekly 2 doses first dose on 10/08/2014. He would come back for follow-up visit in one month for  reevaluation with repeat CBC, iron study and ferritin. He was advised to call immediately if he has any concerning symptoms in the interval. The patient voices understanding of current disease status and treatment options and is in agreement with the current care plan.  All questions were answered. The patient knows to call the clinic with any problems, questions or concerns. We can certainly see the patient much sooner if necessary.  Disclaimer: This note was dictated with voice recognition software. Similar sounding words can inadvertently be transcribed and may not be corrected upon review.

## 2014-11-10 NOTE — Telephone Encounter (Signed)
per pof to sch pt appt-gave pt copy of avs °

## 2014-11-10 NOTE — Progress Notes (Signed)
BP elevated 209/86-87.  Rechecked 198/78-78.  Let Dr Julien Nordmann know that patient did not take his BP medication this morning.  Ordered 0.2mg  Clonidine one time dose.  Also instructed patient to see his PCP about blood pressure.  We can not give him Aranesp if he continues to have elevated blood pressure.

## 2014-11-17 ENCOUNTER — Ambulatory Visit: Payer: Medicare Other

## 2014-11-17 ENCOUNTER — Other Ambulatory Visit (HOSPITAL_BASED_OUTPATIENT_CLINIC_OR_DEPARTMENT_OTHER): Payer: Medicare Other

## 2014-11-17 VITALS — BP 174/75 | HR 84 | Temp 98.1°F

## 2014-11-17 DIAGNOSIS — D649 Anemia, unspecified: Secondary | ICD-10-CM

## 2014-11-17 DIAGNOSIS — D638 Anemia in other chronic diseases classified elsewhere: Secondary | ICD-10-CM | POA: Diagnosis not present

## 2014-11-17 DIAGNOSIS — C61 Malignant neoplasm of prostate: Secondary | ICD-10-CM

## 2014-11-17 DIAGNOSIS — N189 Chronic kidney disease, unspecified: Secondary | ICD-10-CM

## 2014-11-17 DIAGNOSIS — D472 Monoclonal gammopathy: Secondary | ICD-10-CM | POA: Diagnosis not present

## 2014-11-17 DIAGNOSIS — D631 Anemia in chronic kidney disease: Secondary | ICD-10-CM

## 2014-11-17 LAB — CBC WITH DIFFERENTIAL/PLATELET
BASO%: 0.8 % (ref 0.0–2.0)
BASOS ABS: 0 10*3/uL (ref 0.0–0.1)
EOS ABS: 0.1 10*3/uL (ref 0.0–0.5)
EOS%: 1.6 % (ref 0.0–7.0)
HCT: 34.7 % — ABNORMAL LOW (ref 38.4–49.9)
HEMOGLOBIN: 11.2 g/dL — AB (ref 13.0–17.1)
LYMPH%: 17.8 % (ref 14.0–49.0)
MCH: 24.8 pg — AB (ref 27.2–33.4)
MCHC: 32.2 g/dL (ref 32.0–36.0)
MCV: 76.9 fL — ABNORMAL LOW (ref 79.3–98.0)
MONO#: 0.6 10*3/uL (ref 0.1–0.9)
MONO%: 9.3 % (ref 0.0–14.0)
NEUT#: 4.5 10*3/uL (ref 1.5–6.5)
NEUT%: 70.5 % (ref 39.0–75.0)
PLATELETS: 255 10*3/uL (ref 140–400)
RBC: 4.52 10*6/uL (ref 4.20–5.82)
RDW: 22.9 % — ABNORMAL HIGH (ref 11.0–14.6)
WBC: 6.3 10*3/uL (ref 4.0–10.3)
lymph#: 1.1 10*3/uL (ref 0.9–3.3)

## 2014-11-17 MED ORDER — DARBEPOETIN ALFA 300 MCG/0.6ML IJ SOSY: 300.0000 ug | PREFILLED_SYRINGE | Freq: Once | INTRAMUSCULAR | Status: DC

## 2014-11-17 NOTE — Progress Notes (Signed)
Checked BP today. 221/79-81    Is better than the last time he was in the office. Discussed with Jenny Reichmann and his wife about seeing the MD who is controlling his BP medicine r/t his BP continuing to be elevated.   Did not need Aranesp injection today because HGB 11.2 today.

## 2014-12-08 ENCOUNTER — Ambulatory Visit: Payer: Medicare Other

## 2014-12-08 ENCOUNTER — Other Ambulatory Visit (HOSPITAL_BASED_OUTPATIENT_CLINIC_OR_DEPARTMENT_OTHER): Payer: Medicare Other

## 2014-12-08 VITALS — BP 183/72 | HR 92 | Temp 98.1°F

## 2014-12-08 DIAGNOSIS — D472 Monoclonal gammopathy: Secondary | ICD-10-CM

## 2014-12-08 DIAGNOSIS — D638 Anemia in other chronic diseases classified elsewhere: Secondary | ICD-10-CM

## 2014-12-08 DIAGNOSIS — N189 Chronic kidney disease, unspecified: Secondary | ICD-10-CM

## 2014-12-08 DIAGNOSIS — C61 Malignant neoplasm of prostate: Secondary | ICD-10-CM | POA: Diagnosis not present

## 2014-12-08 DIAGNOSIS — D649 Anemia, unspecified: Secondary | ICD-10-CM

## 2014-12-08 DIAGNOSIS — D631 Anemia in chronic kidney disease: Secondary | ICD-10-CM

## 2014-12-08 LAB — CBC WITH DIFFERENTIAL/PLATELET
BASO%: 0.1 % (ref 0.0–2.0)
Basophils Absolute: 0 10*3/uL (ref 0.0–0.1)
EOS ABS: 0.1 10*3/uL (ref 0.0–0.5)
EOS%: 1.1 % (ref 0.0–7.0)
HCT: 31.9 % — ABNORMAL LOW (ref 38.4–49.9)
HEMOGLOBIN: 10.4 g/dL — AB (ref 13.0–17.1)
LYMPH%: 17.1 % (ref 14.0–49.0)
MCH: 25.1 pg — ABNORMAL LOW (ref 27.2–33.4)
MCHC: 32.6 g/dL (ref 32.0–36.0)
MCV: 76.9 fL — AB (ref 79.3–98.0)
MONO#: 0.5 10*3/uL (ref 0.1–0.9)
MONO%: 7.7 % (ref 0.0–14.0)
NEUT%: 74 % (ref 39.0–75.0)
NEUTROS ABS: 5.2 10*3/uL (ref 1.5–6.5)
Platelets: 235 10*3/uL (ref 140–400)
RBC: 4.15 10*6/uL — ABNORMAL LOW (ref 4.20–5.82)
RDW: 18.4 % — AB (ref 11.0–14.6)
WBC: 7 10*3/uL (ref 4.0–10.3)
lymph#: 1.2 10*3/uL (ref 0.9–3.3)

## 2014-12-08 MED ORDER — DARBEPOETIN ALFA 300 MCG/0.6ML IJ SOSY: 300.0000 ug | PREFILLED_SYRINGE | Freq: Once | INTRAMUSCULAR | Status: DC

## 2014-12-08 NOTE — Progress Notes (Signed)
BP elevated 183/72-92  Aranesp held due to BP.  MD aware  Is going to PCP as soon as possible.

## 2014-12-29 ENCOUNTER — Other Ambulatory Visit (HOSPITAL_BASED_OUTPATIENT_CLINIC_OR_DEPARTMENT_OTHER): Payer: Medicare Other

## 2014-12-29 ENCOUNTER — Ambulatory Visit: Payer: Medicare Other

## 2014-12-29 DIAGNOSIS — N189 Chronic kidney disease, unspecified: Secondary | ICD-10-CM | POA: Diagnosis not present

## 2014-12-29 DIAGNOSIS — C61 Malignant neoplasm of prostate: Secondary | ICD-10-CM

## 2014-12-29 DIAGNOSIS — D638 Anemia in other chronic diseases classified elsewhere: Secondary | ICD-10-CM

## 2014-12-29 DIAGNOSIS — D631 Anemia in chronic kidney disease: Secondary | ICD-10-CM

## 2014-12-29 DIAGNOSIS — D472 Monoclonal gammopathy: Secondary | ICD-10-CM

## 2014-12-29 LAB — FERRITIN CHCC

## 2014-12-29 LAB — IRON AND TIBC CHCC
%SAT: 62 % — ABNORMAL HIGH (ref 20–55)
IRON: 126 ug/dL (ref 42–163)
TIBC: 203 ug/dL (ref 202–409)
UIBC: 76 ug/dL — AB (ref 117–376)

## 2014-12-29 LAB — CBC WITH DIFFERENTIAL/PLATELET
BASO%: 0.7 % (ref 0.0–2.0)
BASOS ABS: 0.1 10*3/uL (ref 0.0–0.1)
EOS%: 1.4 % (ref 0.0–7.0)
Eosinophils Absolute: 0.1 10*3/uL (ref 0.0–0.5)
HCT: 33.7 % — ABNORMAL LOW (ref 38.4–49.9)
HEMOGLOBIN: 11 g/dL — AB (ref 13.0–17.1)
LYMPH%: 13.7 % — ABNORMAL LOW (ref 14.0–49.0)
MCH: 25.5 pg — AB (ref 27.2–33.4)
MCHC: 32.8 g/dL (ref 32.0–36.0)
MCV: 77.8 fL — ABNORMAL LOW (ref 79.3–98.0)
MONO#: 0.7 10*3/uL (ref 0.1–0.9)
MONO%: 8.6 % (ref 0.0–14.0)
NEUT%: 75.6 % — ABNORMAL HIGH (ref 39.0–75.0)
NEUTROS ABS: 6.1 10*3/uL (ref 1.5–6.5)
Platelets: 242 10*3/uL (ref 140–400)
RBC: 4.33 10*6/uL (ref 4.20–5.82)
RDW: 19.9 % — AB (ref 11.0–14.6)
WBC: 8.1 10*3/uL (ref 4.0–10.3)
lymph#: 1.1 10*3/uL (ref 0.9–3.3)

## 2014-12-29 MED ORDER — DARBEPOETIN ALFA 300 MCG/0.6ML IJ SOSY: 300.0000 ug | PREFILLED_SYRINGE | Freq: Once | INTRAMUSCULAR | Status: DC

## 2015-01-04 ENCOUNTER — Other Ambulatory Visit (HOSPITAL_BASED_OUTPATIENT_CLINIC_OR_DEPARTMENT_OTHER): Payer: Medicare Other

## 2015-01-04 DIAGNOSIS — D472 Monoclonal gammopathy: Secondary | ICD-10-CM | POA: Diagnosis not present

## 2015-01-04 DIAGNOSIS — C61 Malignant neoplasm of prostate: Secondary | ICD-10-CM

## 2015-01-04 DIAGNOSIS — D638 Anemia in other chronic diseases classified elsewhere: Secondary | ICD-10-CM | POA: Diagnosis not present

## 2015-01-04 LAB — CBC WITH DIFFERENTIAL/PLATELET
BASO%: 0.7 % (ref 0.0–2.0)
Basophils Absolute: 0.1 10*3/uL (ref 0.0–0.1)
EOS%: 1.3 % (ref 0.0–7.0)
Eosinophils Absolute: 0.1 10*3/uL (ref 0.0–0.5)
HCT: 31.8 % — ABNORMAL LOW (ref 38.4–49.9)
HGB: 10.6 g/dL — ABNORMAL LOW (ref 13.0–17.1)
LYMPH%: 14.8 % (ref 14.0–49.0)
MCH: 25.9 pg — ABNORMAL LOW (ref 27.2–33.4)
MCHC: 33.2 g/dL (ref 32.0–36.0)
MCV: 77.9 fL — ABNORMAL LOW (ref 79.3–98.0)
MONO#: 0.7 10*3/uL (ref 0.1–0.9)
MONO%: 8.7 % (ref 0.0–14.0)
NEUT%: 74.5 % (ref 39.0–75.0)
NEUTROS ABS: 6.3 10*3/uL (ref 1.5–6.5)
Platelets: 219 10*3/uL (ref 140–400)
RBC: 4.08 10*6/uL — AB (ref 4.20–5.82)
RDW: 19.6 % — AB (ref 11.0–14.6)
WBC: 8.4 10*3/uL (ref 4.0–10.3)
lymph#: 1.2 10*3/uL (ref 0.9–3.3)

## 2015-01-10 ENCOUNTER — Other Ambulatory Visit: Payer: Self-pay | Admitting: Medical Oncology

## 2015-01-11 ENCOUNTER — Telehealth: Payer: Self-pay | Admitting: Internal Medicine

## 2015-01-11 ENCOUNTER — Ambulatory Visit: Payer: Medicare Other | Admitting: Internal Medicine

## 2015-01-11 NOTE — Telephone Encounter (Signed)
returned call and cx todays appt....pt ok and aware

## 2015-01-18 ENCOUNTER — Other Ambulatory Visit: Payer: Self-pay | Admitting: *Deleted

## 2015-01-18 DIAGNOSIS — N189 Chronic kidney disease, unspecified: Secondary | ICD-10-CM

## 2015-01-19 ENCOUNTER — Other Ambulatory Visit (HOSPITAL_BASED_OUTPATIENT_CLINIC_OR_DEPARTMENT_OTHER): Payer: Medicare Other

## 2015-01-19 ENCOUNTER — Ambulatory Visit (HOSPITAL_BASED_OUTPATIENT_CLINIC_OR_DEPARTMENT_OTHER): Payer: Medicare Other

## 2015-01-19 VITALS — BP 172/64 | HR 86 | Temp 98.8°F

## 2015-01-19 DIAGNOSIS — N189 Chronic kidney disease, unspecified: Secondary | ICD-10-CM | POA: Diagnosis not present

## 2015-01-19 DIAGNOSIS — D472 Monoclonal gammopathy: Secondary | ICD-10-CM | POA: Diagnosis not present

## 2015-01-19 DIAGNOSIS — D631 Anemia in chronic kidney disease: Secondary | ICD-10-CM | POA: Diagnosis not present

## 2015-01-19 DIAGNOSIS — C61 Malignant neoplasm of prostate: Secondary | ICD-10-CM

## 2015-01-19 LAB — CBC WITH DIFFERENTIAL/PLATELET
BASO%: 0.5 % (ref 0.0–2.0)
Basophils Absolute: 0 10*3/uL (ref 0.0–0.1)
EOS ABS: 0.1 10*3/uL (ref 0.0–0.5)
EOS%: 1.3 % (ref 0.0–7.0)
HEMATOCRIT: 28.5 % — AB (ref 38.4–49.9)
HGB: 9.4 g/dL — ABNORMAL LOW (ref 13.0–17.1)
LYMPH#: 0.9 10*3/uL (ref 0.9–3.3)
LYMPH%: 9.8 % — AB (ref 14.0–49.0)
MCH: 26 pg — ABNORMAL LOW (ref 27.2–33.4)
MCHC: 33 g/dL (ref 32.0–36.0)
MCV: 79 fL — AB (ref 79.3–98.0)
MONO#: 0.9 10*3/uL (ref 0.1–0.9)
MONO%: 9.6 % (ref 0.0–14.0)
NEUT%: 78.8 % — ABNORMAL HIGH (ref 39.0–75.0)
NEUTROS ABS: 7 10*3/uL — AB (ref 1.5–6.5)
PLATELETS: 242 10*3/uL (ref 140–400)
RBC: 3.6 10*6/uL — ABNORMAL LOW (ref 4.20–5.82)
RDW: 19.2 % — ABNORMAL HIGH (ref 11.0–14.6)
WBC: 8.8 10*3/uL (ref 4.0–10.3)

## 2015-01-19 LAB — IRON AND TIBC CHCC
%SAT: 32 % (ref 20–55)
Iron: 57 ug/dL (ref 42–163)
TIBC: 179 ug/dL — AB (ref 202–409)
UIBC: 122 ug/dL (ref 117–376)

## 2015-01-19 LAB — FERRITIN CHCC: Ferritin: 1458 ng/ml — ABNORMAL HIGH (ref 22–316)

## 2015-01-19 LAB — COMPREHENSIVE METABOLIC PANEL (CC13)
ALBUMIN: 3.3 g/dL — AB (ref 3.5–5.0)
ALK PHOS: 111 U/L (ref 40–150)
ALT: 10 U/L (ref 0–55)
AST: 8 U/L (ref 5–34)
Anion Gap: 9 mEq/L (ref 3–11)
BILIRUBIN TOTAL: 0.32 mg/dL (ref 0.20–1.20)
BUN: 86 mg/dL — ABNORMAL HIGH (ref 7.0–26.0)
CALCIUM: 8.9 mg/dL (ref 8.4–10.4)
CO2: 21 mEq/L — ABNORMAL LOW (ref 22–29)
CREATININE: 4.1 mg/dL — AB (ref 0.7–1.3)
Chloride: 100 mEq/L (ref 98–109)
EGFR: 16 mL/min/{1.73_m2} — AB (ref >90)
Glucose: 413 mg/dl — ABNORMAL HIGH (ref 70–140)
Potassium: 5 mEq/L (ref 3.5–5.1)
Sodium: 129 mEq/L — ABNORMAL LOW (ref 136–145)
TOTAL PROTEIN: 6.8 g/dL (ref 6.4–8.3)

## 2015-01-19 MED ORDER — DARBEPOETIN ALFA 300 MCG/0.6ML IJ SOSY
300.0000 ug | PREFILLED_SYRINGE | Freq: Once | INTRAMUSCULAR | Status: AC
  Administered 2015-01-19: 300 ug via SUBCUTANEOUS
  Filled 2015-01-19: qty 0.6

## 2015-01-19 NOTE — Progress Notes (Signed)
Roger Wood here for Aranesp injection.  HGB 9.4  BP 172/64-86.  Has not had his blood pressure medicine for a couple days, due to wife being in hospital and not being home to get it.  His wife has had a stroke and has some blockages from clots.  He is very concerned and crying.  Spoke to Dr Julien Nordmann, who said to give Aranesp today

## 2015-02-08 ENCOUNTER — Other Ambulatory Visit: Payer: Self-pay | Admitting: Medical Oncology

## 2015-02-08 DIAGNOSIS — D649 Anemia, unspecified: Secondary | ICD-10-CM

## 2015-02-09 ENCOUNTER — Ambulatory Visit (HOSPITAL_BASED_OUTPATIENT_CLINIC_OR_DEPARTMENT_OTHER): Payer: Medicare Other

## 2015-02-09 ENCOUNTER — Other Ambulatory Visit (HOSPITAL_BASED_OUTPATIENT_CLINIC_OR_DEPARTMENT_OTHER): Payer: Medicare Other

## 2015-02-09 VITALS — BP 169/76 | HR 73 | Temp 97.8°F

## 2015-02-09 DIAGNOSIS — D649 Anemia, unspecified: Secondary | ICD-10-CM

## 2015-02-09 DIAGNOSIS — D631 Anemia in chronic kidney disease: Secondary | ICD-10-CM

## 2015-02-09 DIAGNOSIS — N189 Chronic kidney disease, unspecified: Secondary | ICD-10-CM

## 2015-02-09 DIAGNOSIS — C61 Malignant neoplasm of prostate: Secondary | ICD-10-CM | POA: Diagnosis not present

## 2015-02-09 DIAGNOSIS — D472 Monoclonal gammopathy: Secondary | ICD-10-CM | POA: Diagnosis not present

## 2015-02-09 LAB — CBC WITH DIFFERENTIAL/PLATELET
BASO%: 0.4 % (ref 0.0–2.0)
Basophils Absolute: 0 10*3/uL (ref 0.0–0.1)
EOS ABS: 0.1 10*3/uL (ref 0.0–0.5)
EOS%: 2 % (ref 0.0–7.0)
HEMATOCRIT: 30.9 % — AB (ref 38.4–49.9)
HGB: 10.2 g/dL — ABNORMAL LOW (ref 13.0–17.1)
LYMPH#: 1.2 10*3/uL (ref 0.9–3.3)
LYMPH%: 22.1 % (ref 14.0–49.0)
MCH: 25.8 pg — ABNORMAL LOW (ref 27.2–33.4)
MCHC: 33 g/dL (ref 32.0–36.0)
MCV: 78 fL — AB (ref 79.3–98.0)
MONO#: 0.5 10*3/uL (ref 0.1–0.9)
MONO%: 8 % (ref 0.0–14.0)
NEUT%: 67.5 % (ref 39.0–75.0)
NEUTROS ABS: 3.8 10*3/uL (ref 1.5–6.5)
PLATELETS: 240 10*3/uL (ref 140–400)
RBC: 3.96 10*6/uL — ABNORMAL LOW (ref 4.20–5.82)
RDW: 16.3 % — ABNORMAL HIGH (ref 11.0–14.6)
WBC: 5.6 10*3/uL (ref 4.0–10.3)

## 2015-02-09 LAB — COMPREHENSIVE METABOLIC PANEL (CC13)
ALK PHOS: 102 U/L (ref 40–150)
ALT: 11 U/L (ref 0–55)
ANION GAP: 11 meq/L (ref 3–11)
AST: 9 U/L (ref 5–34)
Albumin: 3.4 g/dL — ABNORMAL LOW (ref 3.5–5.0)
BILIRUBIN TOTAL: 0.33 mg/dL (ref 0.20–1.20)
BUN: 80 mg/dL — ABNORMAL HIGH (ref 7.0–26.0)
CALCIUM: 9 mg/dL (ref 8.4–10.4)
CO2: 19 mEq/L — ABNORMAL LOW (ref 22–29)
CREATININE: 3.3 mg/dL — AB (ref 0.7–1.3)
Chloride: 103 mEq/L (ref 98–109)
EGFR: 20 mL/min/{1.73_m2} — ABNORMAL LOW (ref >90)
Glucose: 292 mg/dl — ABNORMAL HIGH (ref 70–140)
Potassium: 4.4 mEq/L (ref 3.5–5.1)
Sodium: 133 mEq/L — ABNORMAL LOW (ref 136–145)
TOTAL PROTEIN: 6.9 g/dL (ref 6.4–8.3)

## 2015-02-09 MED ORDER — DARBEPOETIN ALFA 300 MCG/0.6ML IJ SOSY
300.0000 ug | PREFILLED_SYRINGE | Freq: Once | INTRAMUSCULAR | Status: AC
  Administered 2015-02-09: 300 ug via SUBCUTANEOUS
  Filled 2015-02-09: qty 0.6

## 2015-03-01 ENCOUNTER — Other Ambulatory Visit: Payer: Self-pay | Admitting: *Deleted

## 2015-03-01 DIAGNOSIS — D649 Anemia, unspecified: Secondary | ICD-10-CM

## 2015-03-01 DIAGNOSIS — D472 Monoclonal gammopathy: Secondary | ICD-10-CM

## 2015-03-02 ENCOUNTER — Other Ambulatory Visit: Payer: Medicare Other

## 2015-03-02 ENCOUNTER — Ambulatory Visit (HOSPITAL_BASED_OUTPATIENT_CLINIC_OR_DEPARTMENT_OTHER): Payer: Medicare Other | Admitting: Internal Medicine

## 2015-03-02 ENCOUNTER — Ambulatory Visit: Payer: Medicare Other

## 2015-03-02 ENCOUNTER — Encounter: Payer: Self-pay | Admitting: Internal Medicine

## 2015-03-02 ENCOUNTER — Other Ambulatory Visit (HOSPITAL_BASED_OUTPATIENT_CLINIC_OR_DEPARTMENT_OTHER): Payer: Medicare Other

## 2015-03-02 ENCOUNTER — Telehealth: Payer: Self-pay | Admitting: Internal Medicine

## 2015-03-02 VITALS — BP 180/73 | HR 77 | Temp 97.9°F | Resp 19 | Ht 71.0 in | Wt 262.2 lb

## 2015-03-02 DIAGNOSIS — D638 Anemia in other chronic diseases classified elsewhere: Secondary | ICD-10-CM

## 2015-03-02 DIAGNOSIS — D649 Anemia, unspecified: Secondary | ICD-10-CM | POA: Diagnosis not present

## 2015-03-02 DIAGNOSIS — D472 Monoclonal gammopathy: Secondary | ICD-10-CM | POA: Diagnosis not present

## 2015-03-02 DIAGNOSIS — I10 Essential (primary) hypertension: Secondary | ICD-10-CM | POA: Diagnosis not present

## 2015-03-02 DIAGNOSIS — C61 Malignant neoplasm of prostate: Secondary | ICD-10-CM

## 2015-03-02 LAB — CBC WITH DIFFERENTIAL/PLATELET
BASO%: 0.7 % (ref 0.0–2.0)
Basophils Absolute: 0.1 10*3/uL (ref 0.0–0.1)
EOS%: 1.5 % (ref 0.0–7.0)
Eosinophils Absolute: 0.1 10*3/uL (ref 0.0–0.5)
HEMATOCRIT: 30.8 % — AB (ref 38.4–49.9)
HGB: 10 g/dL — ABNORMAL LOW (ref 13.0–17.1)
LYMPH#: 0.9 10*3/uL (ref 0.9–3.3)
LYMPH%: 11.6 % — ABNORMAL LOW (ref 14.0–49.0)
MCH: 25.9 pg — ABNORMAL LOW (ref 27.2–33.4)
MCHC: 32.5 g/dL (ref 32.0–36.0)
MCV: 79.8 fL (ref 79.3–98.0)
MONO#: 0.6 10*3/uL (ref 0.1–0.9)
MONO%: 8 % (ref 0.0–14.0)
NEUT#: 6.1 10*3/uL (ref 1.5–6.5)
NEUT%: 78.2 % — AB (ref 39.0–75.0)
Platelets: 201 10*3/uL (ref 140–400)
RBC: 3.86 10*6/uL — AB (ref 4.20–5.82)
RDW: 17.6 % — ABNORMAL HIGH (ref 11.0–14.6)
WBC: 7.8 10*3/uL (ref 4.0–10.3)

## 2015-03-02 LAB — COMPREHENSIVE METABOLIC PANEL (CC13)
ALT: 18 U/L (ref 0–55)
AST: 13 U/L (ref 5–34)
Albumin: 3 g/dL — ABNORMAL LOW (ref 3.5–5.0)
Alkaline Phosphatase: 95 U/L (ref 40–150)
Anion Gap: 9 mEq/L (ref 3–11)
BUN: 50.7 mg/dL — AB (ref 7.0–26.0)
CHLORIDE: 110 meq/L — AB (ref 98–109)
CO2: 18 meq/L — AB (ref 22–29)
CREATININE: 3.1 mg/dL — AB (ref 0.7–1.3)
Calcium: 8.7 mg/dL (ref 8.4–10.4)
EGFR: 21 mL/min/{1.73_m2} — ABNORMAL LOW (ref >90)
Glucose: 236 mg/dl — ABNORMAL HIGH (ref 70–140)
Potassium: 4.5 mEq/L (ref 3.5–5.1)
Sodium: 137 mEq/L (ref 136–145)
Total Bilirubin: 0.58 mg/dL (ref 0.20–1.20)
Total Protein: 6.3 g/dL — ABNORMAL LOW (ref 6.4–8.3)

## 2015-03-02 MED ORDER — CLONIDINE HCL 0.1 MG PO TABS
ORAL_TABLET | ORAL | Status: AC
  Filled 2015-03-02: qty 2

## 2015-03-02 MED ORDER — CLONIDINE HCL 0.1 MG PO TABS
0.2000 mg | ORAL_TABLET | Freq: Once | ORAL | Status: AC
  Administered 2015-03-02: 0.2 mg via ORAL

## 2015-03-02 NOTE — Telephone Encounter (Signed)
Added 3 mth f/u and additional q3w lab/inj appointments for January and February 2017. Other appointments remain the same. Left message informing patient and confirming next appointment for 11/30. Patient to get new schedule 11/30.

## 2015-03-02 NOTE — Progress Notes (Signed)
Unable to received his Aranesp injection due to elevated BP 180/73.   Will return in 3 weeks for reevaluation of CBC and BP

## 2015-03-02 NOTE — Progress Notes (Signed)
[place] [phone]   [phone] OFFICE PROGRESS NOTE   DIAGNOSIS:  1) Anemia of chronic disease.  2) Monoclonal gammopathy of undetermined significance. 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 Janice Norrie.  4) uncontrolled hypertension.  PRIOR THERAPY: Feraheme Infusion last dose was given 10/15/2014.   CURRENT THERAPY: Aranesp 300 mcg subcutaneously every 3 weeks.  INTERVAL HISTORY: Roger Wood 74 y.o. male returns to the clinic today for followup visit accompanied by his wife. The patient is feeling fine today with no specific complaints. He denied having any chest pain, shortness of breath, cough or hemoptysis. He is tolerating his treatment with Aranesp 300 mcg subcutaneously every 3 weeks fairly well. He denied having any significant weight loss or night sweats. He denied having any nausea or vomiting or rectal bleeding. His blood pressure is still uncontrolled and in the clinic today towards up to 210/80. He did not take his blood pressure medication earlier today. He had repeat CBC and  metabolic panel performed recently and he is here for evaluation and discussion of his lab results.  MEDICAL HISTORY: Past Medical History  Diagnosis Date  . Hypertension   . Gout   . Arthritis   . History of radiation therapy 01/27/11-03/26/11    prostate  . Refusal of blood transfusions as patient is Jehovah's Witness   . Cataract     b/l catartact surgery  . Anemia     takes iron supplements  . Prostate cancer ([place]) 07/14/10    dx  . Diabetes mellitus     adult onset   . Type II diabetes mellitus with nephropathy [facility])     Patient denies  . Family history of adverse reaction to anesthesia     sister has difficulty waking up  . Dieulafoy ulcer     ALLERGIES:  is allergic to asa.  MEDICATIONS:  Current Outpatient Prescriptions  Medication Sig  Dispense Refill  . albuterol (PROVENTIL HFA;VENTOLIN HFA) 108 (90 BASE) MCG/ACT inhaler Inhale 1-2 puffs into the lungs every 6 (six) hours as needed for wheezing or shortness of breath.    . docusate sodium (COLACE) 100 MG capsule Take 1 capsule (100 mg total) by mouth 2 (two) times daily as needed for mild constipation. 10 capsule 0  . ferrous sulfate 325 (65 FE) MG tablet Take 1 tablet (325 mg total) by mouth 3 (three) times daily with meals. 90 tablet 0  . hydrALAZINE (APRESOLINE) 25 MG tablet Take 1 tablet (25 mg total) by mouth 2 (two) times daily. (Patient taking differently: Take 12.5 mg by mouth 2 (two) times daily. )    . hydrocortisone (ANUSOL-HC) 2.5 % rectal cream Place rectally 3 (three) times daily. 30 g 0  . metoprolol succinate (TOPROL-XL) 50 MG 24 hr tablet Take 50 mg by mouth daily.    Marland Kitchen ULORIC 40 MG tablet      No current facility-administered medications for this visit.    REVIEW OF SYSTEMS:  A comprehensive review of systems was negative.   PHYSICAL EXAMINATION: General appearance: alert, cooperative, fatigued and no distress Head: Normocephalic, without obvious abnormality, atraumatic Neck: no adenopathy, no JVD, supple, symmetrical, trachea midline and thyroid not enlarged, symmetric, no tenderness/mass/nodules Lymph nodes: Cervical, supraclavicular, and axillary nodes normal. Resp: clear to auscultation bilaterally Back: symmetric, no curvature. ROM normal. No CVA tenderness. Cardio: regular rate and rhythm, S1, S2 normal,  no murmur, click, rub or gallop GI: soft, non-tender; bowel sounds normal; no masses,  no organomegaly Extremities: extremities normal, atraumatic, no cyanosis or edema  ECOG PERFORMANCE STATUS: 2 - Symptomatic, <50% confined to bed  Blood pressure 210/80, pulse 101, temperature 97.9 F (36.6 C), temperature source Oral, resp. rate 19, height 5\' 11"  (1.803 m), weight 262 lb 3.2 oz (118.933 kg), SpO2 99 %.  LABORATORY DATA: Lab Results    Component Value Date   WBC 7.8 03/02/2015   HGB 10.0* 03/02/2015   HCT 30.8* 03/02/2015   MCV 79.8 03/02/2015   PLT 201 03/02/2015      Chemistry      Component Value Date/Time   NA 137 03/02/2015 1125   NA 130* 09/05/2014 2258   K 4.5 03/02/2015 1125   K 4.9 09/05/2014 2258   CL 93* 09/05/2014 2258   CL 106 01/29/2012 1520   CO2 18* 03/02/2015 1125   CO2 25 09/05/2014 2258   BUN 50.7* 03/02/2015 1125   BUN 54* 09/05/2014 2258   CREATININE 3.1* 03/02/2015 1125   CREATININE 3.53* 09/05/2014 2258      Component Value Date/Time   CALCIUM 8.7 03/02/2015 1125   CALCIUM 8.7* 09/05/2014 2258   ALKPHOS 95 03/02/2015 1125   ALKPHOS 49 07/25/2014 0530   AST 13 03/02/2015 1125   AST 11 07/25/2014 0530   ALT 18 03/02/2015 1125   ALT 11 07/25/2014 0530   BILITOT 0.58 03/02/2015 1125   BILITOT 0.5 07/25/2014 0530       RADIOGRAPHIC STUDIES:   ASSESSMENT AND PLAN: This is a very pleasant 74 years old [place] American male with persistent anemia most likely secondary to anemia of chronic disease secondary to chronic renal insufficiency.   His myeloma panel and iron study showed no significant disease progression or iron deficiency. The patient continues to have persistent anemia and he is Jehov[employer]'s Witness. I recommended for him to continue treatment with Aranesp 300 mcg subcutaneously every 3 weeks for the anemia of chronic disease. He will be able to receive his injection today secondary to the uncontrolled hypertension. For the uncontrolled hypertension, I strongly advised the patient to take his blood pressure medication at regular basis and to consult with his nephrologist and primary care physician regarding adjustment of his doses. I also gave the patient a dose of clonidine 0.2 mg by mouth 1 in the clinic today. He would come back for follow-up visit in 3 months for reevaluation with repeat CBC, iron study and ferritin. He was advised to call immediately if he has any  concerning symptoms in the interval. The patient voices understanding of current disease status and treatment options and is in agreement with the current care plan.  All questions were answered. The patient knows to call the clinic with any problems, questions or concerns. We can certainly see the patient much sooner if necessary.  Disclaimer: This note was dictated with voice recognition software. Similar sounding words can inadvertently be transcribed and may not be corrected upon review.

## 2015-03-23 ENCOUNTER — Other Ambulatory Visit: Payer: Self-pay | Admitting: *Deleted

## 2015-03-23 ENCOUNTER — Other Ambulatory Visit (HOSPITAL_BASED_OUTPATIENT_CLINIC_OR_DEPARTMENT_OTHER): Payer: Medicare Other

## 2015-03-23 ENCOUNTER — Ambulatory Visit: Payer: Medicare Other

## 2015-03-23 DIAGNOSIS — D638 Anemia in other chronic diseases classified elsewhere: Secondary | ICD-10-CM | POA: Diagnosis not present

## 2015-03-23 DIAGNOSIS — C61 Malignant neoplasm of prostate: Secondary | ICD-10-CM | POA: Diagnosis not present

## 2015-03-23 DIAGNOSIS — D472 Monoclonal gammopathy: Secondary | ICD-10-CM

## 2015-03-23 DIAGNOSIS — D649 Anemia, unspecified: Secondary | ICD-10-CM

## 2015-03-23 LAB — CBC WITH DIFFERENTIAL/PLATELET
BASO%: 0.5 % (ref 0.0–2.0)
BASOS ABS: 0.1 10*3/uL (ref 0.0–0.1)
EOS ABS: 0.1 10*3/uL (ref 0.0–0.5)
EOS%: 1.5 % (ref 0.0–7.0)
HCT: 33.4 % — ABNORMAL LOW (ref 38.4–49.9)
HEMOGLOBIN: 11 g/dL — AB (ref 13.0–17.1)
LYMPH%: 12.7 % — ABNORMAL LOW (ref 14.0–49.0)
MCH: 26.4 pg — AB (ref 27.2–33.4)
MCHC: 33 g/dL (ref 32.0–36.0)
MCV: 80.1 fL (ref 79.3–98.0)
MONO#: 0.7 10*3/uL (ref 0.1–0.9)
MONO%: 7.4 % (ref 0.0–14.0)
NEUT#: 7.7 10*3/uL — ABNORMAL HIGH (ref 1.5–6.5)
NEUT%: 77.9 % — AB (ref 39.0–75.0)
PLATELETS: 288 10*3/uL (ref 140–400)
RBC: 4.17 10*6/uL — ABNORMAL LOW (ref 4.20–5.82)
RDW: 16.6 % — AB (ref 11.0–14.6)
WBC: 9.9 10*3/uL (ref 4.0–10.3)
lymph#: 1.3 10*3/uL (ref 0.9–3.3)

## 2015-04-12 ENCOUNTER — Other Ambulatory Visit: Payer: Self-pay | Admitting: *Deleted

## 2015-04-12 DIAGNOSIS — D649 Anemia, unspecified: Secondary | ICD-10-CM

## 2015-04-13 ENCOUNTER — Ambulatory Visit: Payer: Medicare Other

## 2015-04-13 ENCOUNTER — Other Ambulatory Visit (HOSPITAL_BASED_OUTPATIENT_CLINIC_OR_DEPARTMENT_OTHER): Payer: Medicare Other

## 2015-04-13 DIAGNOSIS — D649 Anemia, unspecified: Secondary | ICD-10-CM

## 2015-04-13 LAB — CBC WITH DIFFERENTIAL/PLATELET
BASO%: 0.7 % (ref 0.0–2.0)
BASOS ABS: 0.1 10*3/uL (ref 0.0–0.1)
EOS ABS: 0.1 10*3/uL (ref 0.0–0.5)
EOS%: 1.4 % (ref 0.0–7.0)
HCT: 31.7 % — ABNORMAL LOW (ref 38.4–49.9)
HEMOGLOBIN: 10.2 g/dL — AB (ref 13.0–17.1)
LYMPH%: 13.2 % — ABNORMAL LOW (ref 14.0–49.0)
MCH: 26.2 pg — ABNORMAL LOW (ref 27.2–33.4)
MCHC: 32.1 g/dL (ref 32.0–36.0)
MCV: 81.6 fL (ref 79.3–98.0)
MONO#: 0.7 10*3/uL (ref 0.1–0.9)
MONO%: 8.1 % (ref 0.0–14.0)
NEUT%: 76.6 % — ABNORMAL HIGH (ref 39.0–75.0)
NEUTROS ABS: 6.8 10*3/uL — AB (ref 1.5–6.5)
PLATELETS: 298 10*3/uL (ref 140–400)
RBC: 3.88 10*6/uL — ABNORMAL LOW (ref 4.20–5.82)
RDW: 16.4 % — AB (ref 11.0–14.6)
WBC: 8.9 10*3/uL (ref 4.0–10.3)
lymph#: 1.2 10*3/uL (ref 0.9–3.3)

## 2015-04-13 NOTE — Progress Notes (Signed)
Pt entered clinic today for labs and possible injection. Hemoglobin  Noted at 10.2. Injection held, pt instructed to keep up coming appointmnets.

## 2015-05-03 ENCOUNTER — Telehealth: Payer: Self-pay | Admitting: Internal Medicine

## 2015-05-03 ENCOUNTER — Other Ambulatory Visit: Payer: Self-pay | Admitting: *Deleted

## 2015-05-03 DIAGNOSIS — D649 Anemia, unspecified: Secondary | ICD-10-CM

## 2015-05-03 NOTE — Telephone Encounter (Signed)
per wife cxd 1/11 lab/inj due to weather - cannot get out - does not want to r/s will resume 2/1 - confirmed 2/1 appt - desk nurse informed.

## 2015-05-04 ENCOUNTER — Ambulatory Visit: Payer: Medicare Other

## 2015-05-04 ENCOUNTER — Other Ambulatory Visit: Payer: Medicare Other

## 2015-05-06 ENCOUNTER — Encounter (HOSPITAL_COMMUNITY): Payer: Self-pay | Admitting: Emergency Medicine

## 2015-05-06 ENCOUNTER — Emergency Department (HOSPITAL_COMMUNITY)
Admission: EM | Admit: 2015-05-06 | Discharge: 2015-05-25 | Disposition: E | Payer: Medicare Other | Attending: Emergency Medicine | Admitting: Emergency Medicine

## 2015-05-06 DIAGNOSIS — E875 Hyperkalemia: Secondary | ICD-10-CM | POA: Diagnosis not present

## 2015-05-06 DIAGNOSIS — N19 Unspecified kidney failure: Secondary | ICD-10-CM | POA: Insufficient documentation

## 2015-05-06 DIAGNOSIS — Z79899 Other long term (current) drug therapy: Secondary | ICD-10-CM | POA: Insufficient documentation

## 2015-05-06 DIAGNOSIS — Z7952 Long term (current) use of systemic steroids: Secondary | ICD-10-CM | POA: Diagnosis not present

## 2015-05-06 DIAGNOSIS — I469 Cardiac arrest, cause unspecified: Secondary | ICD-10-CM | POA: Diagnosis not present

## 2015-05-06 DIAGNOSIS — Z923 Personal history of irradiation: Secondary | ICD-10-CM | POA: Diagnosis not present

## 2015-05-06 DIAGNOSIS — A419 Sepsis, unspecified organism: Secondary | ICD-10-CM | POA: Diagnosis not present

## 2015-05-06 DIAGNOSIS — M109 Gout, unspecified: Secondary | ICD-10-CM | POA: Insufficient documentation

## 2015-05-06 DIAGNOSIS — J9600 Acute respiratory failure, unspecified whether with hypoxia or hypercapnia: Secondary | ICD-10-CM | POA: Insufficient documentation

## 2015-05-06 DIAGNOSIS — I10 Essential (primary) hypertension: Secondary | ICD-10-CM | POA: Diagnosis not present

## 2015-05-06 DIAGNOSIS — Z87891 Personal history of nicotine dependence: Secondary | ICD-10-CM | POA: Insufficient documentation

## 2015-05-06 DIAGNOSIS — R7989 Other specified abnormal findings of blood chemistry: Secondary | ICD-10-CM

## 2015-05-06 DIAGNOSIS — D649 Anemia, unspecified: Secondary | ICD-10-CM | POA: Diagnosis not present

## 2015-05-06 DIAGNOSIS — I161 Hypertensive emergency: Secondary | ICD-10-CM | POA: Diagnosis not present

## 2015-05-06 DIAGNOSIS — R778 Other specified abnormalities of plasma proteins: Secondary | ICD-10-CM

## 2015-05-06 DIAGNOSIS — E119 Type 2 diabetes mellitus without complications: Secondary | ICD-10-CM | POA: Insufficient documentation

## 2015-05-06 DIAGNOSIS — E872 Acidosis, unspecified: Secondary | ICD-10-CM

## 2015-05-06 DIAGNOSIS — Z8546 Personal history of malignant neoplasm of prostate: Secondary | ICD-10-CM | POA: Insufficient documentation

## 2015-05-06 DIAGNOSIS — R0600 Dyspnea, unspecified: Secondary | ICD-10-CM | POA: Diagnosis present

## 2015-05-06 MED ORDER — LABETALOL HCL 5 MG/ML IV SOLN
20.0000 mg | Freq: Once | INTRAVENOUS | Status: AC
  Administered 2015-05-07: 20 mg via INTRAVENOUS
  Filled 2015-05-06: qty 4

## 2015-05-06 NOTE — ED Notes (Signed)
Patient here with severe shortness of breath. States onset of cough and URI starting 4 days ago. Tonight illness progressed. Upon arrival patient RR @ 32, HR 135, BP 214/82, Rales noted in all fields.

## 2015-05-06 NOTE — ED Provider Notes (Signed)
By signing my name below, I, Roger Wood, attest that this documentation has been prepared under the direction and in the presence of Lind, DO.  Electronically Signed: Forrestine Wood, ED Scribe. 04/26/2015. 2:27 AM.   TIME SEEN: 11:52 PM   CHIEF COMPLAINT:  Chief Complaint  Patient presents with  . Respiratory Distress    LEVEL 5 CAVEAT DUE TO RESPIRATORY DISTRESS   HPI:  HPI Comments: Doylene Bode here with his wife is a 75 y.o. male with a PMHx of HTN, chronic kidney disease not on dialysis, and DM who presents to the Emergency Department here for constant, ongoing, worsening respiratory distress x 4 days. No aggravating or alleviating factors at this time. Wife states pt initially had a cough, post tusive vomiting, and general URI symptoms. No OTC/prescribed medications or at home breathing treatments attempted prior to arrival. No recent fever, chills, nausea, or chest pain. Pt does not wear oxygen at home. He admits to a history of same a few years ago as a result of "my lungs being full of fluid". Wife states pt has missed a few doses of his blood pressure medications since onset of symptoms as "he couldn't keep anything down". No prior history of blood clots. No lower extremity swelling or pain. He is not aware that he has CHF.  PCP: Gearlean Alf., PA-C    ROS:  LEVEL 5 CAVEAT DUE TO RESPIRATORY DISTRESS  PAST MEDICAL HISTORY/PAST SURGICAL HISTORY:  Past Medical History  Diagnosis Date  . Hypertension   . Gout   . Arthritis   . History of radiation therapy 01/27/11-03/26/11    prostate  . Refusal of blood transfusions as patient is Jehovah's Witness   . Cataract     b/l catartact surgery  . Anemia     takes iron supplements  . Prostate cancer ([place]) 07/14/10    dx  . Diabetes mellitus     adult onset   . Type II diabetes mellitus with nephropathy [facility])     Patient denies  . Family history of adverse reaction to anesthesia     sister has difficulty waking up   . Dieulafoy ulcer     MEDICATIONS:  Prior to Admission medications   Medication Sig Start Date End Date Taking? Authorizing Provider  albuterol (PROVENTIL HFA;VENTOLIN HFA) 108 (90 BASE) MCG/ACT inhaler Inhale 1-2 puffs into the lungs every 6 (six) hours as needed for wheezing or shortness of breath.    Historical Provider, MD  docusate sodium (COLACE) 100 MG capsule Take 1 capsule (100 mg total) by mouth 2 (two) times daily as needed for mild constipation. 07/29/14   Annita Brod, MD  ferrous sulfate 325 (65 FE) MG tablet Take 1 tablet (325 mg total) by mouth 3 (three) times daily with meals. 08/02/14   Velvet Bathe, MD  hydrALAZINE (APRESOLINE) 25 MG tablet Take 1 tablet (25 mg total) by mouth 2 (two) times daily. Patient taking differently: Take 12.5 mg by mouth 2 (two) times daily.  08/02/14   Velvet Bathe, MD  hydrocortisone (ANUSOL-HC) 2.5 % rectal cream Place rectally 3 (three) times daily. 08/06/14   Velvet Bathe, MD  metoprolol succinate (TOPROL-XL) 50 MG 24 hr tablet Take 50 mg by mouth daily. 07/02/14   Historical Provider, MD  ULORIC 40 MG tablet  10/01/14   Historical Provider, MD    ALLERGIES:  Allergies  Allergen Reactions  . Asa [Aspirin] Other (See Comments)    Stomach bleeding Stomach bleeding  SOCIAL HISTORY:  Social History  Substance Use Topics  . Smoking status: Former Smoker    Types: Cigarettes    Quit date: 04/24/1967  . Smokeless tobacco: Never Used  . Alcohol Use: No    FAMILY HISTORY: Family History  Problem Relation Age of Onset  . Breast cancer Sister   . Breast cancer Paternal Aunt   . Colon cancer Neg Hx   . Diabetes Mother   . Diabetes Sister   . Diabetes Paternal Aunt   . Heart disease Father   . Stroke Paternal Uncle   . Cancer Paternal Uncle     back    EXAM: BP 214/82 mmHg  Pulse 133  Temp(Src) 98.4 F (36.9 C) (Axillary)  Resp 44  SpO2 86% CONSTITUTIONAL: Alert in moderate to severe respiratory distress HEAD:  Normocephalic EYES: Conjunctivae clear, PERRL ENT: normal nose; no rhinorrhea; moist mucous membranes; pharynx without lesions noted NECK: Supple, no meningismus, no LAD, JVD to mid neck when sitting up right CARD: Regular and tachycardic; S1 and S2 appreciated; no murmurs, no clicks, no rubs, no gallops RESP: Moderate to severe respiratory distress, diffuse rhonchorous with wet breath sounds, tachypnea noted, hyponia on room air, increased work of breathing ABD/GI: Normal bowel sounds; non-distended; soft, non-tender, no rebound, no guarding, no peritoneal signs BACK:  The back appears normal and is non-tender to palpation, there is no CVA tenderness EXT: Normal ROM in all joints; non-tender to palpation; no edema; normal capillary refill; no cyanosis, no calf tenderness or swelling    SKIN: Normal color for age and race; warm NEURO: Moves all extremities equally, sensation to light touch intact diffusely, cranial nerves II through XII intact  MEDICAL DECISION MAKING: Patient here with moderate to severe respiratory distress. He appears volume overloaded as if he is in flash pulmonary edema. He has JVD and is hypertensive, tachycardic. He has wet rhonchorous breath sounds diffusely. He is on BiPAP currently. Does not wear oxygen at home. Appears more comfortable on BiPAP. We'll give IV labetalol, IV Lasix. Portable chest x-ray appeared to show mild edema and left lower lobe atelectasis. Patient will need admission. Patient's wife at bedside.  ED PROGRESS: I was called back into the room after the patient had an episode of becoming unresponsive. He became bradycardic and then went into PEA. CPR was started. Patient intubated. Patient was given Epi and would have ROSC.  This occurred several times (8-10 times with loss of pulses and then ROSC over 2.5 hours) where patient would become bradycardic and go into PEA. He was given multiple rounds of epinephrine, bicarb, calcium. He is also given 1 mg of  atropine during one episode of bradycardia. Discussed with pharmacy who felt that the IV labetalol that was given was likely not the cause of his symptoms and would have worn off quickly but he was given 3 mg of glucagon without relief. His EKGs at times which shows sinus tachycardia and then junctional rhythms. He did have one EKG that appeared to have ST elevation. Cardiology was contacted. He was found to have a troponin of 0.45. Dr. Susy Manor with cardiology had seen patient.  Performed a bedside ultrasound which showed hyperdynamic state with good ejection fraction. No pericardial effusion. Cardiology felt troponin was likely elevated secondary to strain and other underlying metabolic issues. He did not feel patient needed to go directly to the cardiac catheterization lab and this is unlikely the cause of his cardiac arrest.  Labs came back and revealed a leukocytosis. Patient  was found to have a rectal temperature of 102.1. Broad-spectrum antibiotics started with vancomycin and Zosyn. Blood cultures ordered. Patient was started on IV fluids during the code. After intubation repeat chest x-ray appeared to show bilateral infiltrates concerning for pneumonia.   Labs also revealed a metabolic acidosis likely secondary to uremia from renal failure. His baseline creatinine is normally around 3 and today is now on 10. ABG showed a pH of 6.9. Patient received a total of 6-7 amps of bicarbonate and then was started on a bicarbonate drip. He also appeared to have mild hyperkalemia with potassium of 6.1. He was given IV fluids, Lasix, bicarbonate, calcium. Case was discussed with Dr. Augustin Coupe from nephrology who also came down to see the patient. He was deemed likely too unstable to receive dialysis.   Given patient seemed to improve after epinephrine each time he coded and therefore an epinephrine drip was started. Dr. Jimmy Footman with CCM contacted and agreed with this plan.     Dr. Ancil Linsey critical care fellow arrived  during one of the last episodes of cardiac arrest.  Wife was brought back into the room.  CCM and myself discussed with pt's family who decided to withdraw care given he had been resuscitated now for over 2-1/2 hours. Patient now had fixed pupils, no corneal response, no gag or cough. He was not sedated or paralyzed.  TOD at 2:13 AM. Discussed with Dr. Record from [facility]. He will update patient's PCP Dr. Worthy Keeler to complete the death certificate. Patient would not be a medical examiner case.     EKG Interpretation  Date/Time:  Friday May 06 2015 23:41:05 EST Ventricular Rate:  135 PR Interval:  154 QRS Duration: 110 QT Interval:  320 QTC Calculation: 480 R Axis:   8 Text Interpretation:  Sinus tachycardia Septal infarct , age undetermined Abnormal ECG Confirmed by Tiffiney Sparrow,  DO, Lizbett Garciagarcia [phone]) on 04/24/2015 11:59:36 PM        EKG Interpretation  Date/Time:  06/04/2015 00:21:36 EST Ventricular Rate:  97 PR Interval:  199 QRS Duration: 130 QT Interval:  382 QTC Calculation: 485 R Axis:   53 Text Interpretation:  Sinus rhythm Nonspecific intraventricular conduction delay ST elevation suggests acute pericarditis Confirmed by Tola Meas,  DO, Gulianna Hornsby [phone]) on June 04, 2015 8:22:31 AM        EKG Interpretation  Date/Time:  06/04/15 00:29:28 EST Ventricular Rate:  103 PR Interval:  199 QRS Duration: 116 QT Interval:  319 QTC Calculation: 417 R Axis:   130 Text Interpretation:  AV dissociation Supraventricular bigeminy Nonspecific intraventricular conduction delay Anteroseptal infarct, acute Confirmed by Maurissa Ambrose,  DO, Jataya Wann [phone]) on 06/04/2015 8:23:08 AM        EKG Interpretation  Date/Time:  06-04-15 00:33:40 EST Ventricular Rate:  142 PR Interval:  199 QRS Duration: 142 QT Interval:  348 QTC Calculation: 535 R Axis:   44 Text Interpretation:  Junctional tachycardia Probable left ventricular hypertrophy  Prolonged QT interval Baseline wander in lead(s) V6 Confirmed by Kelcey Korus,  DO, Treanna Dumler [id number]) on Jun 04, 2015 8:24:20 AM        EKG Interpretation  Date/Time:  June 04, 2015 00:40:40 EST Ventricular Rate:  77 PR Interval:  136 QRS Duration: 200 QT Interval:  455 QTC Calculation: 515 R Axis:   -179 Text Interpretation:  Sinus rhythm Atrial premature complexes Right bundle branch block Confirmed by Jovane Foutz,  DO, Agusta Hackenberg [id number]) on 06-04-15 8:25:07 AM  EKG Interpretation  Date/Time:  06-04-2015 00:46:56 EST Ventricular Rate:  119 PR Interval:  196 QRS Duration: 139 QT Interval:  317 QTC Calculation: 446 R Axis:   16 Text Interpretation:  Sinus tachycardia with irregular rate Probable left ventricular hypertrophy Anterior ST elevation, probably due to LVH Confirmed by Tashima Scarpulla,  DO, Latonia Conrow [phone]) on 06-04-15 8:25:51 AM         EKG Interpretation  Date/Time:  06-04-15 01:03:05 EST Ventricular Rate:  140 PR Interval:  196 QRS Duration: 124 QT Interval:  374 QTC Calculation: 571 R Axis:   65 Text Interpretation:  Junctional tachycardia Probable left ventricular hypertrophy Minimal ST elevation, inferior leads Prolonged QT interval Confirmed by Rockell Faulks,  DO, Nadalee Neiswender [phone]) on 06/04/2015 8:26:24 AM         EKG Interpretation  Date/Time:  06/04/2015 02:11:27 EST Ventricular Rate:  0 PR Interval:  196 QRS Duration: 124 QT Interval:  374 QTC Calculation: 571 R Axis:   0 Text Interpretation:  Ventricular parasystole Confirmed by Jorie Zee,  DO, Amara Justen [id number]) on 06-04-15 8:27:04 AM        INTUBATION Performed by: Nyra Jabs  Required items: required blood products, implants, devices, and special equipment available Patient identity confirmed: provided demographic data and hospital-assigned identification number Time out: Immediately prior to procedure a "time out" was called to verify the correct patient,  procedure, equipment, support staff and site/side marked as required.  Indications: Cardiac arrest, respiratory arrest   Intubation method: Glidescope Laryngoscopy   Preoxygenation: BVM  Sedatives: none Paralytic: none  Tube Size: 7.5 cuffed  Post-procedure assessment: chest rise and ETCO2 monitor Breath sounds: equal and absent over the epigastrium Tube secured with: ETT holder Chest x-ray interpreted by radiologist and me.  Chest x-ray findings: endotracheal tube in appropriate position  Patient tolerated the procedure well with no immediate complications.  Cardiopulmonary Resuscitation (CPR) Procedure Note Directed/Performed by: Nyra Jabs I personally directed ancillary staff and/or performed CPR in an effort to regain return of spontaneous circulation and to maintain cardiac, neuro and systemic perfusion.    CRITICAL CARE Performed by: Nyra Jabs   Total critical care time: 90 minutes  Critical care time was exclusive of separately billable procedures and treating other patients.  Critical care was necessary to treat or prevent imminent or life-threatening deterioration.  Critical care was time spent personally by me on the following activities: development of treatment plan with patient and/or surrogate as well as nursing, discussions with consultants, evaluation of patient's response to treatment, examination of patient, obtaining history from patient or surrogate, ordering and performing treatments and interventions, ordering and review of laboratory studies, ordering and review of radiographic studies, pulse oximetry and re-evaluation of patient's condition.   I personally performed the services described in this documentation, which was scribed in my presence. The recorded information has been reviewed and is accurate.        [place], DO Jun 04, 2015 [phone]

## 2015-05-07 ENCOUNTER — Emergency Department (HOSPITAL_COMMUNITY): Payer: Medicare Other

## 2015-05-07 LAB — BASIC METABOLIC PANEL
Anion gap: 24 — ABNORMAL HIGH (ref 5–15)
BUN: 97 mg/dL — AB (ref 6–20)
CO2: 10 mmol/L — ABNORMAL LOW (ref 22–32)
CREATININE: 9.47 mg/dL — AB (ref 0.61–1.24)
Calcium: 8.2 mg/dL — ABNORMAL LOW (ref 8.9–10.3)
Chloride: 94 mmol/L — ABNORMAL LOW (ref 101–111)
GFR calc Af Amer: 5 mL/min — ABNORMAL LOW (ref >60)
GFR, EST NON AFRICAN AMERICAN: 5 mL/min — AB (ref >60)
Glucose, Bld: 382 mg/dL — ABNORMAL HIGH (ref 65–99)
Potassium: 6.1 mmol/L (ref 3.5–5.1)
SODIUM: 128 mmol/L — AB (ref 135–145)

## 2015-05-07 LAB — I-STAT ARTERIAL BLOOD GAS, ED
ACID-BASE DEFICIT: 24 mmol/L — AB (ref 0.0–2.0)
BICARBONATE: 7.5 meq/L — AB (ref 20.0–24.0)
O2 Saturation: 99 %
TCO2: 9 mmol/L (ref 0–100)
pCO2 arterial: 38.4 mmHg (ref 35.0–45.0)
pH, Arterial: 6.9 — CL (ref 7.350–7.450)
pO2, Arterial: 198 mmHg — ABNORMAL HIGH (ref 80.0–100.0)

## 2015-05-07 LAB — TROPONIN I: Troponin I: 0.45 ng/mL — ABNORMAL HIGH (ref <0.031)

## 2015-05-07 LAB — CBG MONITORING, ED
GLUCOSE-CAPILLARY: 345 mg/dL — AB (ref 65–99)
Glucose-Capillary: 311 mg/dL — ABNORMAL HIGH (ref 65–99)
Glucose-Capillary: 332 mg/dL — ABNORMAL HIGH (ref 65–99)

## 2015-05-07 LAB — CBC
HCT: 24.6 % — ABNORMAL LOW (ref 39.0–52.0)
Hemoglobin: 8.3 g/dL — ABNORMAL LOW (ref 13.0–17.0)
MCH: 26.7 pg (ref 26.0–34.0)
MCHC: 33.7 g/dL (ref 30.0–36.0)
MCV: 79.1 fL (ref 78.0–100.0)
PLATELETS: 278 10*3/uL (ref 150–400)
RBC: 3.11 MIL/uL — ABNORMAL LOW (ref 4.22–5.81)
RDW: 16.4 % — AB (ref 11.5–15.5)
WBC: 28.3 10*3/uL — AB (ref 4.0–10.5)

## 2015-05-07 LAB — BRAIN NATRIURETIC PEPTIDE: B NATRIURETIC PEPTIDE 5: 906.6 pg/mL — AB (ref 0.0–100.0)

## 2015-05-07 MED ORDER — STERILE WATER FOR INJECTION IV SOLN
INTRAVENOUS | Status: DC
  Administered 2015-05-07: 01:00:00 via INTRAVENOUS
  Filled 2015-05-07 (×2): qty 850

## 2015-05-07 MED ORDER — ACETAMINOPHEN 650 MG RE SUPP
650.0000 mg | Freq: Once | RECTAL | Status: AC
  Administered 2015-05-07: 650 mg via RECTAL

## 2015-05-07 MED ORDER — ETOMIDATE 2 MG/ML IV SOLN
INTRAVENOUS | Status: AC | PRN
  Administered 2015-05-07: 30 mg via INTRAVENOUS

## 2015-05-07 MED ORDER — SODIUM CHLORIDE 0.9 % IV SOLN: INTRAVENOUS | Status: DC

## 2015-05-07 MED ORDER — SODIUM CHLORIDE 0.9 % IV BOLUS (SEPSIS)
1000.0000 mL | Freq: Once | INTRAVENOUS | Status: AC
  Administered 2015-05-07: 1000 mL via INTRAVENOUS

## 2015-05-07 MED ORDER — SUCCINYLCHOLINE CHLORIDE 20 MG/ML IJ SOLN
INTRAMUSCULAR | Status: AC | PRN
  Administered 2015-05-07: 100 mg via INTRAVENOUS

## 2015-05-07 MED ORDER — FUROSEMIDE 10 MG/ML IJ SOLN
80.0000 mg | Freq: Once | INTRAMUSCULAR | Status: AC
  Administered 2015-05-07: 60 mg via INTRAVENOUS
  Filled 2015-05-07 (×2): qty 8

## 2015-05-07 MED ORDER — EPINEPHRINE HCL 1 MG/ML IJ SOLN
0.5000 ug/min | INTRAMUSCULAR | Status: AC
  Administered 2015-05-07: 5 ug/min via INTRAVENOUS
  Filled 2015-05-07: qty 4

## 2015-05-07 MED ORDER — VANCOMYCIN HCL IN DEXTROSE 1-5 GM/200ML-% IV SOLN
1000.0000 mg | Freq: Once | INTRAVENOUS | Status: AC
  Administered 2015-05-07: 1000 mg via INTRAVENOUS

## 2015-05-07 MED ORDER — ATROPINE SULFATE 1 MG/ML IJ SOLN
INTRAMUSCULAR | Status: AC | PRN
  Administered 2015-05-07: 1 mg via INTRAVENOUS

## 2015-05-07 MED ORDER — SODIUM BICARBONATE 8.4 % IV SOLN
INTRAVENOUS | Status: AC | PRN
  Administered 2015-05-07 (×6): 50 meq via INTRAVENOUS

## 2015-05-07 MED ORDER — EPINEPHRINE HCL 0.1 MG/ML IJ SOSY
PREFILLED_SYRINGE | INTRAMUSCULAR | Status: AC | PRN
  Administered 2015-05-07 (×7): 1 mg via INTRAVENOUS

## 2015-05-07 MED ORDER — CALCIUM CHLORIDE 10 % IV SOLN
INTRAVENOUS | Status: AC | PRN
  Administered 2015-05-07 (×2): 1 g via INTRAVENOUS
  Administered 2015-05-07: 100 mg via INTRAVENOUS

## 2015-05-07 MED ORDER — GLUCAGON HCL (RDNA) 1 MG IJ SOLR
INTRAMUSCULAR | Status: AC | PRN
  Administered 2015-05-07 (×3): 1 mg via INTRAVENOUS

## 2015-05-07 MED ORDER — PIPERACILLIN-TAZOBACTAM 3.375 G IVPB 30 MIN
3.3750 g | Freq: Once | INTRAVENOUS | Status: AC
  Administered 2015-05-07: 3.375 g via INTRAVENOUS

## 2015-05-11 MED FILL — Medication: Qty: 1 | Status: AC

## 2015-05-25 ENCOUNTER — Ambulatory Visit: Payer: Medicare Other

## 2015-05-25 ENCOUNTER — Other Ambulatory Visit: Payer: Medicare Other

## 2015-05-25 NOTE — Code Documentation (Signed)
Pulses  check, pulses present  

## 2015-05-25 NOTE — Code Documentation (Signed)
Pulse lost. CPR started.

## 2015-05-25 NOTE — Code Documentation (Signed)
Family at beside. Family given emotional support. 

## 2015-05-25 NOTE — Code Documentation (Signed)
Pulses lost, CPR started.

## 2015-05-25 NOTE — Progress Notes (Signed)
Patient has coded multiple of times. RT has been bagging the patient for a few hours. Mechanical ventilation was terminated at 213 by MD verbal order.

## 2015-05-25 NOTE — Progress Notes (Signed)
RT assisted ED physician with intubation. ETCO2 positive color change. Bilateral breath sounds present. CXR and ABG pending.

## 2015-05-25 NOTE — Code Documentation (Signed)
Nephrology at bedside

## 2015-05-25 NOTE — Code Documentation (Signed)
Pulses check, MD talked with Critical Care, Epi drip to be started

## 2015-05-25 NOTE — Code Documentation (Addendum)
Pulses 137

## 2015-05-25 NOTE — Progress Notes (Signed)
   Jun 03, 2015 0200  Clinical Encounter Type  Visited With Patient;Family;Patient and family together;Health care provider  Visit Type Initial;Psychological support;Spiritual support;Social support;Critical Care;ED  Referral From Nurse;Physician;Care management  Consult/Referral To [facility] community  Spiritual Encounters  Spiritual Needs Emotional  Stress Factors  Family Stress Factors Exhausted;Lack of knowledge;Loss of control   Chaplain tried to provided emotional support and care however, family did not want any support from [place]. There is nothing Chaplain can offer to this family at this time. If family changes their mind please page chaplain.  Thanks

## 2015-05-25 NOTE — Code Documentation (Signed)
Pulses back

## 2015-05-25 NOTE — Code Documentation (Signed)
Pulses lost CPR start

## 2015-05-25 NOTE — Code Documentation (Signed)
Critical care at bedside  

## 2015-05-25 NOTE — Code Documentation (Signed)
CPR start 

## 2015-05-25 NOTE — Code Documentation (Signed)
Pulses present  

## 2015-05-25 NOTE — Code Documentation (Addendum)
Pulses check, pulse present

## 2015-05-25 NOTE — Code Documentation (Signed)
Pulses lost, CPR restarted.

## 2015-05-25 NOTE — Code Documentation (Signed)
Family updated as to patient's status.

## 2015-05-25 NOTE — Progress Notes (Signed)
Called for admission by ED - was actively coding patient at the time of arrival in the ED. Patient had been undergoing ACLS for 1.5 hours by ED MD report. Refractory acidosis. Patient had corneals which were barely present, no cough/gag/resposne to noxious stimulus to the nares. Advised family that the patient was neurologically devestated, and was dying. Addressed family who agreed to transition to comfort care.  Luz Brazen, MD Pulmonary & Critical Care Medicine 2015/06/02, 2:43 AM

## 2015-05-25 NOTE — Code Documentation (Signed)
Family updated as to patient's status. Family decides that they want to continue with life saving measures.

## 2015-05-25 NOTE — Code Documentation (Signed)
Family decided to make comfort care.

## 2015-05-25 DEATH — deceased

## 2015-06-15 ENCOUNTER — Ambulatory Visit: Payer: Medicare Other | Admitting: Internal Medicine

## 2015-06-15 ENCOUNTER — Ambulatory Visit: Payer: Medicare Other

## 2015-06-15 ENCOUNTER — Other Ambulatory Visit: Payer: Medicare Other

## 2015-09-30 IMAGING — CT CT HEAD W/O CM
2 series · 16 of 30 positions shown, 20 images · non-contrast
Comparison: 04/10/2014

CLINICAL DATA: Dyspnea since yesterday.

EXAM:
CT HEAD WITHOUT CONTRAST
TECHNIQUE: Contiguous axial images were obtained from the base of the skull
through the vertex without intravenous contrast.

[Series 201: head w/o, idose (1) · axial · non-contrast · 0.49mm/px · z∈[+92,+232]mm · 13 of 34 slices shown, 17 images]
[im 3/34  brain]
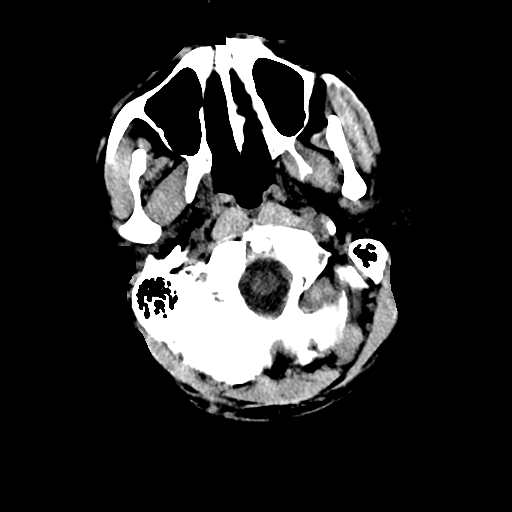
[im 3/34  bone]
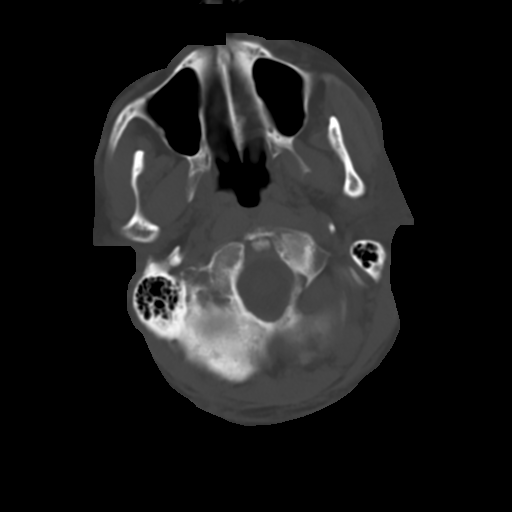
[im 5/34  brain]
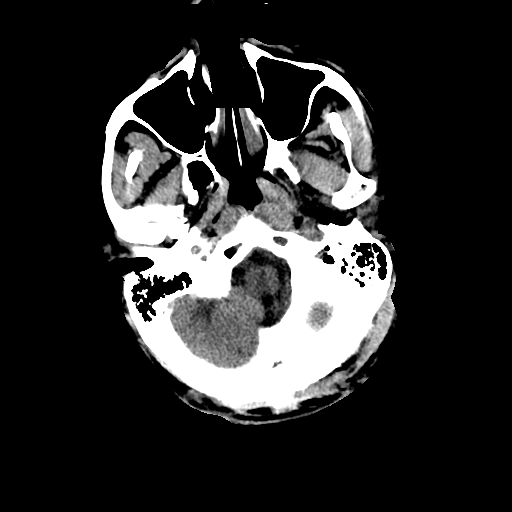
[im 8/34  brain]
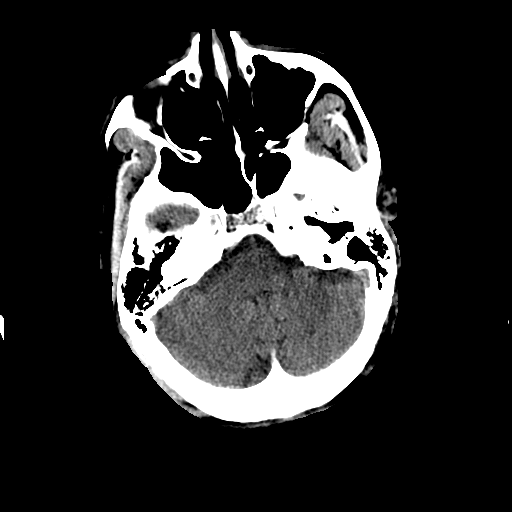
[im 10/34  brain]
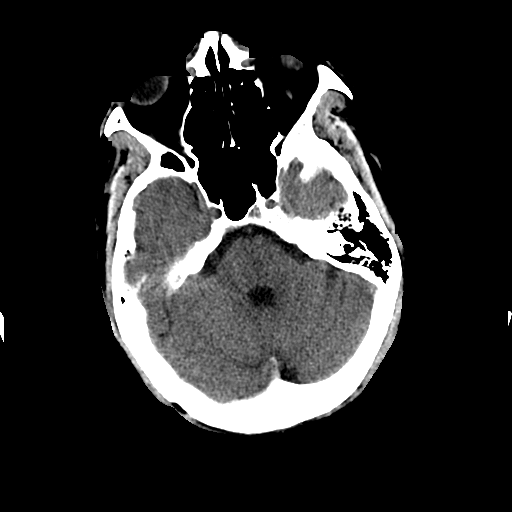
[im 12/34  brain]
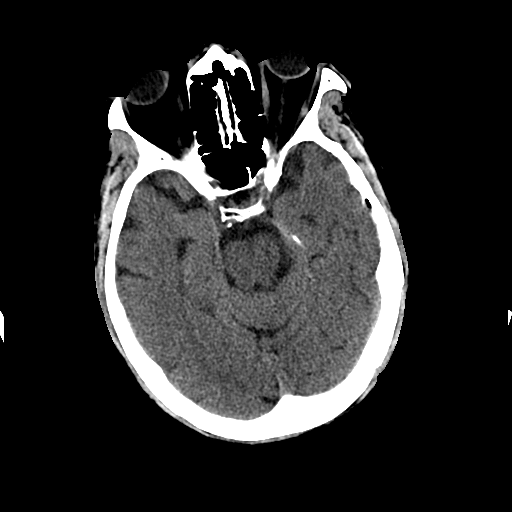
[im 12/34  bone]
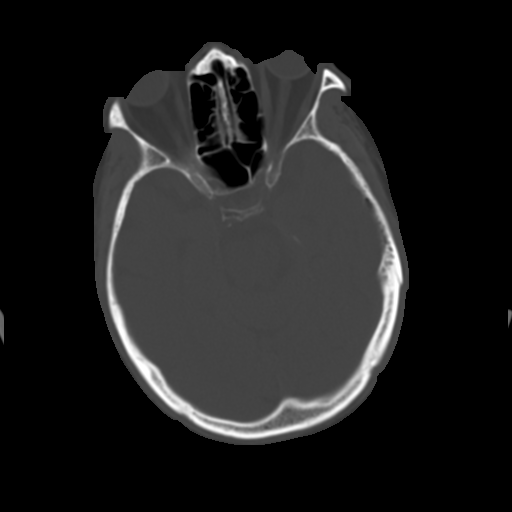
[im 15/34  brain]
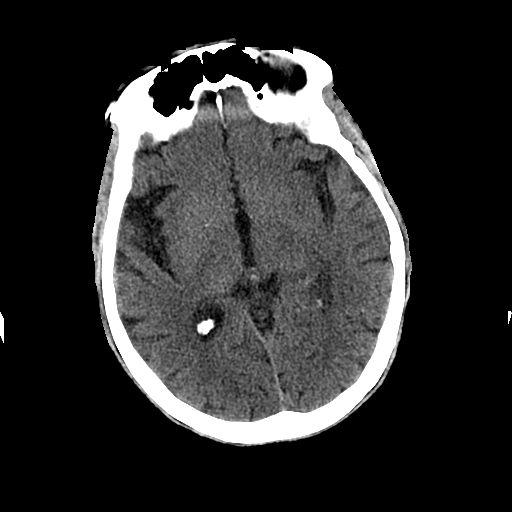
[im 17/34  brain]
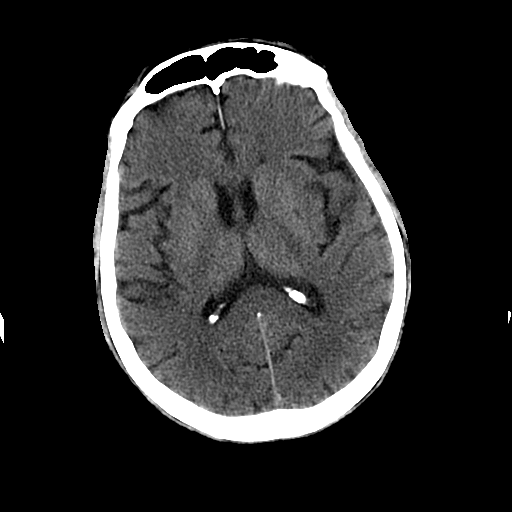
[im 19/34  brain]
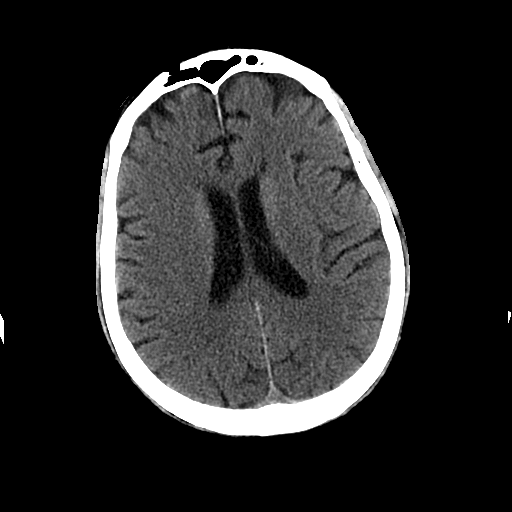
[im 22/34  brain]
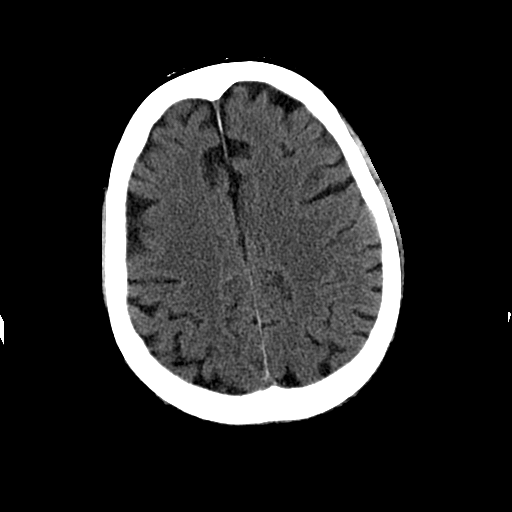
[im 22/34  bone]
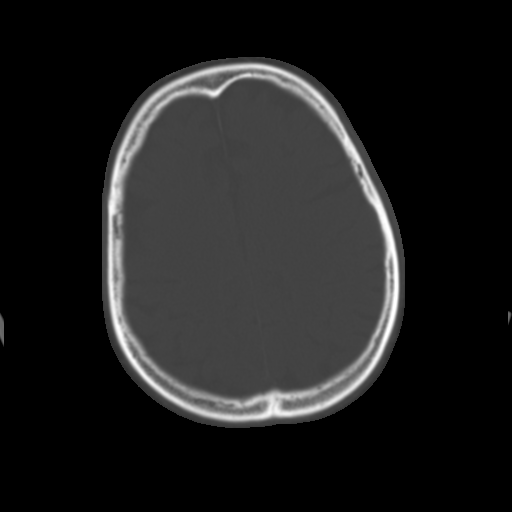
[im 24/34  brain]
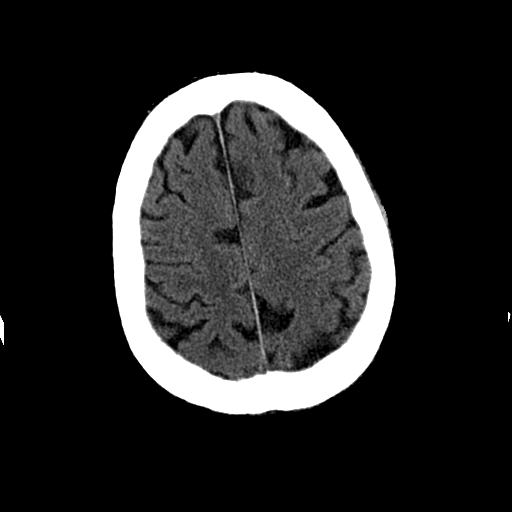
[im 26/34  brain]
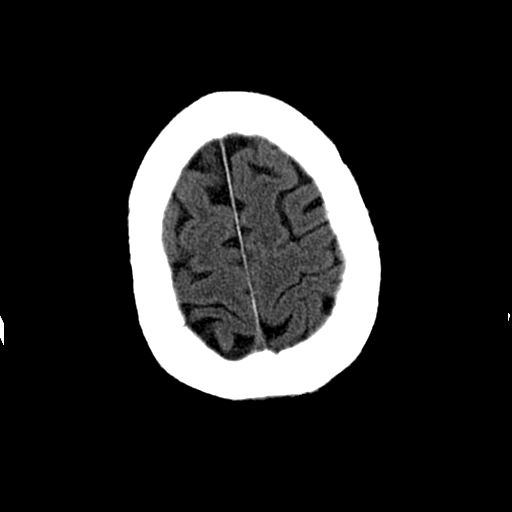
[im 29/34  brain]
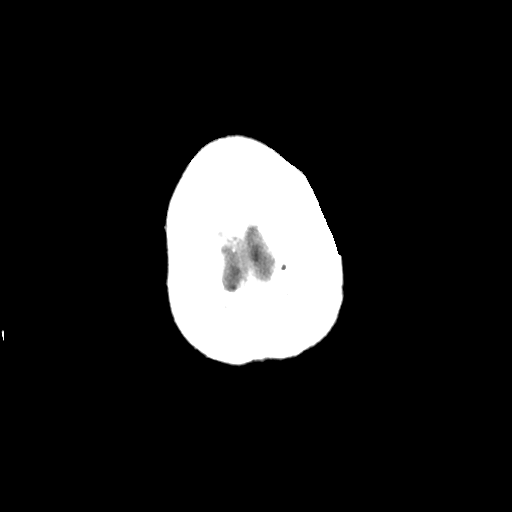
[im 31/34  brain]
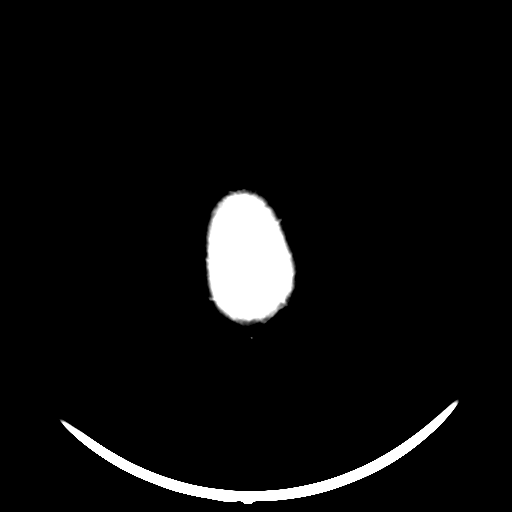
[im 31/34  bone]
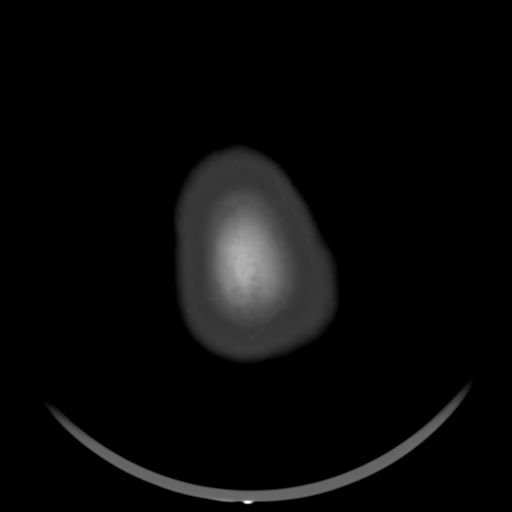

[Series 202: head w/o bone, idose (1) · axial · non-contrast · 0.49mm/px · z∈[+92,+127]mm · 3 of 34 slices shown]
[im 3/34  bone]
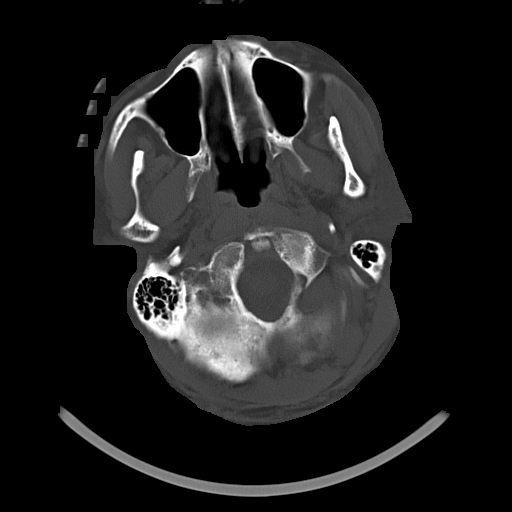
[im 8/34  bone]
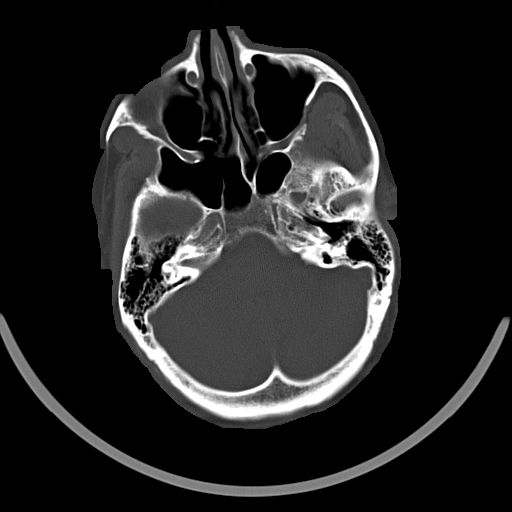
[im 10/34  bone]
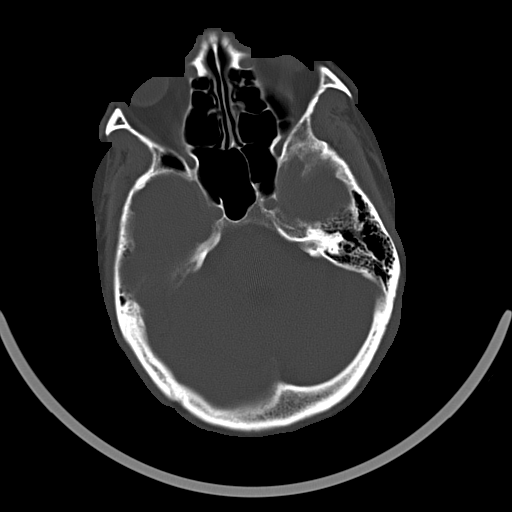

[16 of 30 positions shown; findings below may reference images not displayed]

FINDINGS: There is no intracranial hemorrhage, mass or evidence of acute
infarction. There is mild generalized atrophy. There is mild chronic
microvascular ischemic change. There is no significant extra-axial
fluid collection.

No acute intracranial findings are evident.
IMPRESSION: Mild atrophy and chronic microvascular changes.  No acute findings.

## 2015-10-10 ENCOUNTER — Other Ambulatory Visit: Payer: Self-pay | Admitting: Nurse Practitioner
